# Patient Record
Sex: Female | Born: 1965 | Race: Black or African American | Hispanic: No | Marital: Married | Smoking: Former smoker
Health system: Southern US, Community
[De-identification: ages and names within clinical notes are randomized; demographics above are authoritative.]

## PROBLEM LIST (undated history)

## (undated) DIAGNOSIS — E119 Type 2 diabetes mellitus without complications: Secondary | ICD-10-CM

## (undated) DIAGNOSIS — T7840XA Allergy, unspecified, initial encounter: Secondary | ICD-10-CM

## (undated) DIAGNOSIS — F17200 Nicotine dependence, unspecified, uncomplicated: Secondary | ICD-10-CM

## (undated) DIAGNOSIS — B028 Zoster with other complications: Secondary | ICD-10-CM

## (undated) DIAGNOSIS — E669 Obesity, unspecified: Secondary | ICD-10-CM

## (undated) DIAGNOSIS — F32A Depression, unspecified: Secondary | ICD-10-CM

## (undated) DIAGNOSIS — M1990 Unspecified osteoarthritis, unspecified site: Secondary | ICD-10-CM

## (undated) DIAGNOSIS — F329 Major depressive disorder, single episode, unspecified: Secondary | ICD-10-CM

## (undated) DIAGNOSIS — K219 Gastro-esophageal reflux disease without esophagitis: Secondary | ICD-10-CM

## (undated) DIAGNOSIS — R51 Headache: Secondary | ICD-10-CM

## (undated) DIAGNOSIS — I10 Essential (primary) hypertension: Secondary | ICD-10-CM

## (undated) DIAGNOSIS — G8929 Other chronic pain: Secondary | ICD-10-CM

## (undated) DIAGNOSIS — J329 Chronic sinusitis, unspecified: Secondary | ICD-10-CM

## (undated) DIAGNOSIS — R519 Headache, unspecified: Secondary | ICD-10-CM

## (undated) HISTORY — DX: Major depressive disorder, single episode, unspecified: F32.9

## (undated) HISTORY — DX: Unspecified osteoarthritis, unspecified site: M19.90

## (undated) HISTORY — DX: Depression, unspecified: F32.A

## (undated) HISTORY — DX: Gastro-esophageal reflux disease without esophagitis: K21.9

## (undated) HISTORY — DX: Chronic sinusitis, unspecified: J32.9

## (undated) HISTORY — DX: Type 2 diabetes mellitus without complications: E11.9

## (undated) HISTORY — DX: Zoster with other complications: B02.8

## (undated) HISTORY — PX: TONSILLECTOMY: SUR1361

## (undated) HISTORY — DX: Allergy, unspecified, initial encounter: T78.40XA

## (undated) HISTORY — DX: Headache, unspecified: R51.9

## (undated) HISTORY — DX: Headache: R51

## (undated) HISTORY — DX: Obesity, unspecified: E66.9

## (undated) HISTORY — DX: Nicotine dependence, unspecified, uncomplicated: F17.200

## (undated) HISTORY — DX: Other chronic pain: G89.29

## (undated) HISTORY — DX: Essential (primary) hypertension: I10

---

## 1992-07-25 HISTORY — PX: TUBAL LIGATION: SHX77

## 1993-07-25 HISTORY — PX: CHOLECYSTECTOMY: SHX55

## 2000-09-15 ENCOUNTER — Ambulatory Visit (HOSPITAL_COMMUNITY): Admission: RE | Admit: 2000-09-15 | Discharge: 2000-09-15 | Payer: Self-pay | Admitting: Diagnostic Radiology

## 2000-10-13 ENCOUNTER — Inpatient Hospital Stay (HOSPITAL_COMMUNITY): Admission: EM | Admit: 2000-10-13 | Discharge: 2000-10-14 | Payer: Self-pay | Admitting: Diagnostic Radiology

## 2001-11-12 ENCOUNTER — Encounter: Payer: Self-pay | Admitting: Family Medicine

## 2001-11-12 ENCOUNTER — Ambulatory Visit (HOSPITAL_COMMUNITY): Admission: RE | Admit: 2001-11-12 | Discharge: 2001-11-12 | Payer: Self-pay | Admitting: Family Medicine

## 2001-12-31 ENCOUNTER — Emergency Department (HOSPITAL_COMMUNITY): Admission: EM | Admit: 2001-12-31 | Discharge: 2001-12-31 | Payer: Self-pay | Admitting: Emergency Medicine

## 2002-01-01 ENCOUNTER — Emergency Department (HOSPITAL_COMMUNITY): Admission: EM | Admit: 2002-01-01 | Discharge: 2002-01-01 | Payer: Self-pay | Admitting: Emergency Medicine

## 2002-07-25 HISTORY — PX: OTHER SURGICAL HISTORY: SHX169

## 2002-11-07 ENCOUNTER — Ambulatory Visit (HOSPITAL_BASED_OUTPATIENT_CLINIC_OR_DEPARTMENT_OTHER): Admission: RE | Admit: 2002-11-07 | Discharge: 2002-11-07 | Payer: Self-pay | Admitting: Obstetrics and Gynecology

## 2003-11-26 ENCOUNTER — Emergency Department (HOSPITAL_COMMUNITY): Admission: EM | Admit: 2003-11-26 | Discharge: 2003-11-27 | Payer: Self-pay | Admitting: *Deleted

## 2003-12-01 ENCOUNTER — Ambulatory Visit (HOSPITAL_COMMUNITY): Admission: RE | Admit: 2003-12-01 | Discharge: 2003-12-01 | Payer: Self-pay | Admitting: *Deleted

## 2003-12-22 ENCOUNTER — Ambulatory Visit (HOSPITAL_COMMUNITY): Admission: RE | Admit: 2003-12-22 | Discharge: 2003-12-22 | Payer: Self-pay | Admitting: Orthopedic Surgery

## 2004-05-04 ENCOUNTER — Ambulatory Visit (HOSPITAL_COMMUNITY): Admission: RE | Admit: 2004-05-04 | Discharge: 2004-05-04 | Payer: Self-pay | Admitting: Neurology

## 2004-05-31 ENCOUNTER — Ambulatory Visit: Payer: Self-pay | Admitting: Family Medicine

## 2004-08-10 ENCOUNTER — Ambulatory Visit: Payer: Self-pay | Admitting: Family Medicine

## 2004-10-18 ENCOUNTER — Emergency Department (HOSPITAL_COMMUNITY): Admission: EM | Admit: 2004-10-18 | Discharge: 2004-10-18 | Payer: Self-pay | Admitting: Emergency Medicine

## 2004-10-18 ENCOUNTER — Ambulatory Visit: Payer: Self-pay | Admitting: Psychiatry

## 2004-10-18 ENCOUNTER — Inpatient Hospital Stay (HOSPITAL_COMMUNITY): Admission: RE | Admit: 2004-10-18 | Discharge: 2004-10-22 | Payer: Self-pay | Admitting: Psychiatry

## 2004-10-20 ENCOUNTER — Ambulatory Visit (HOSPITAL_COMMUNITY): Admission: RE | Admit: 2004-10-20 | Discharge: 2004-10-20 | Payer: Self-pay | Admitting: Psychiatry

## 2004-10-26 ENCOUNTER — Ambulatory Visit: Payer: Self-pay | Admitting: Family Medicine

## 2004-11-09 ENCOUNTER — Ambulatory Visit: Payer: Self-pay | Admitting: Family Medicine

## 2004-11-17 ENCOUNTER — Ambulatory Visit: Payer: Self-pay | Admitting: Family Medicine

## 2005-01-10 ENCOUNTER — Ambulatory Visit (HOSPITAL_COMMUNITY): Admission: RE | Admit: 2005-01-10 | Discharge: 2005-01-10 | Payer: Self-pay | Admitting: Family Medicine

## 2005-01-10 ENCOUNTER — Ambulatory Visit: Payer: Self-pay | Admitting: Family Medicine

## 2005-01-12 ENCOUNTER — Ambulatory Visit (HOSPITAL_COMMUNITY): Admission: RE | Admit: 2005-01-12 | Discharge: 2005-01-12 | Payer: Self-pay | Admitting: Family Medicine

## 2005-01-27 ENCOUNTER — Ambulatory Visit (HOSPITAL_COMMUNITY): Admission: RE | Admit: 2005-01-27 | Discharge: 2005-01-27 | Payer: Self-pay | Admitting: Family Medicine

## 2005-05-02 ENCOUNTER — Ambulatory Visit: Payer: Self-pay | Admitting: Family Medicine

## 2005-06-01 ENCOUNTER — Emergency Department (HOSPITAL_COMMUNITY): Admission: EM | Admit: 2005-06-01 | Discharge: 2005-06-01 | Payer: Self-pay | Admitting: Emergency Medicine

## 2005-06-02 ENCOUNTER — Ambulatory Visit: Payer: Self-pay | Admitting: Family Medicine

## 2005-08-24 ENCOUNTER — Ambulatory Visit: Payer: Self-pay | Admitting: Family Medicine

## 2005-10-18 ENCOUNTER — Ambulatory Visit: Payer: Self-pay | Admitting: Family Medicine

## 2005-11-28 ENCOUNTER — Ambulatory Visit (HOSPITAL_COMMUNITY): Admission: RE | Admit: 2005-11-28 | Discharge: 2005-11-28 | Payer: Self-pay | Admitting: Family Medicine

## 2006-01-22 ENCOUNTER — Encounter (INDEPENDENT_AMBULATORY_CARE_PROVIDER_SITE_OTHER): Payer: Self-pay | Admitting: *Deleted

## 2006-01-22 LAB — CONVERTED CEMR LAB: Pap Smear: NORMAL

## 2006-02-14 ENCOUNTER — Encounter: Payer: Self-pay | Admitting: Family Medicine

## 2006-02-14 ENCOUNTER — Ambulatory Visit: Payer: Self-pay | Admitting: Family Medicine

## 2006-02-14 ENCOUNTER — Other Ambulatory Visit: Admission: RE | Admit: 2006-02-14 | Discharge: 2006-02-14 | Payer: Self-pay | Admitting: Family Medicine

## 2006-04-24 ENCOUNTER — Ambulatory Visit: Payer: Self-pay | Admitting: Family Medicine

## 2006-04-25 ENCOUNTER — Ambulatory Visit: Payer: Self-pay | Admitting: Family Medicine

## 2006-05-17 ENCOUNTER — Ambulatory Visit: Payer: Self-pay | Admitting: Orthopedic Surgery

## 2006-07-28 ENCOUNTER — Ambulatory Visit: Payer: Self-pay | Admitting: Family Medicine

## 2006-09-08 ENCOUNTER — Ambulatory Visit: Payer: Self-pay | Admitting: Family Medicine

## 2006-09-21 ENCOUNTER — Encounter: Payer: Self-pay | Admitting: Family Medicine

## 2006-09-21 LAB — CONVERTED CEMR LAB
Basophils Absolute: 0 10*3/uL (ref 0.0–0.1)
CO2: 27 meq/L (ref 19–32)
Cholesterol: 164 mg/dL (ref 0–200)
Eosinophils Absolute: 0.1 10*3/uL (ref 0.0–0.7)
Glucose, Bld: 91 mg/dL (ref 70–99)
HDL: 60 mg/dL (ref >39)
Lymphocytes Relative: 47 % — ABNORMAL HIGH (ref 12–46)
Lymphs Abs: 2.2 10*3/uL (ref 0.7–3.3)
Neutrophils Relative %: 30 % — ABNORMAL LOW (ref 43–77)
Platelets: 249 10*3/uL (ref 150–400)
Potassium: 4.3 meq/L (ref 3.5–5.3)
RDW: 14.7 % — ABNORMAL HIGH (ref 11.5–14.0)
Sodium: 138 meq/L (ref 135–145)
Total CHOL/HDL Ratio: 2.7
VLDL: 16 mg/dL (ref 0–40)
WBC: 4.6 10*3/uL (ref 4.0–10.5)

## 2006-11-30 IMAGING — CT CT PELVIS W/ CM
1 of 3 series · 14 of 32 positions shown, 19 images · IV contrast (omnipaque)
Comparison: None.

CLINICAL DATA: Bilateral lower extremity swelling.  Mid abdominal pain.
 ABDOMEN CT WITH CONTRAST:
TECHNIQUE: Multidetector CT imaging of the abdomen was performed following the standard protocol during bolus administration of intravenous contrast.
 Contrast:  150 cc Omnipaque 300.
TECHNIQUE: Multidetector CT imaging of the pelvis was performed following the standard protocol during bolus administration of intravenous contrast.

[Series 2536: — · axial · 0.88mm/px · z∈[+1420,+1854]mm · 14 of 99 slices shown, 19 images]
[im 6/99  soft-tissue]
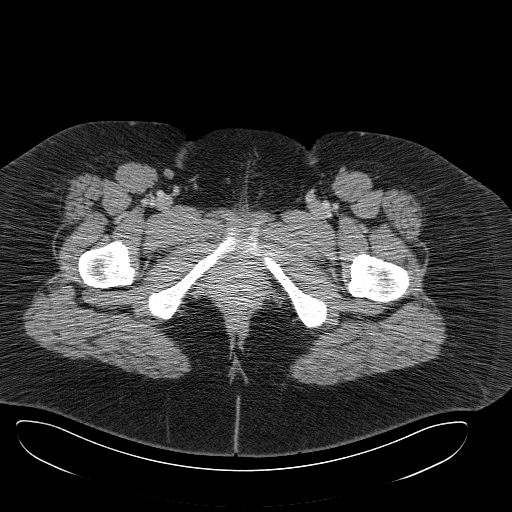
[im 6/99  bone]
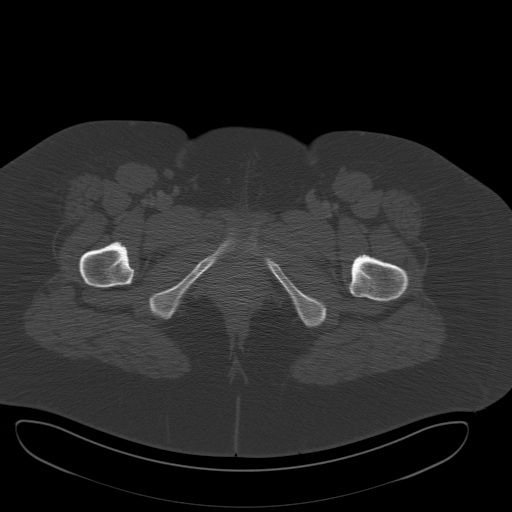
[im 12/99  soft-tissue]
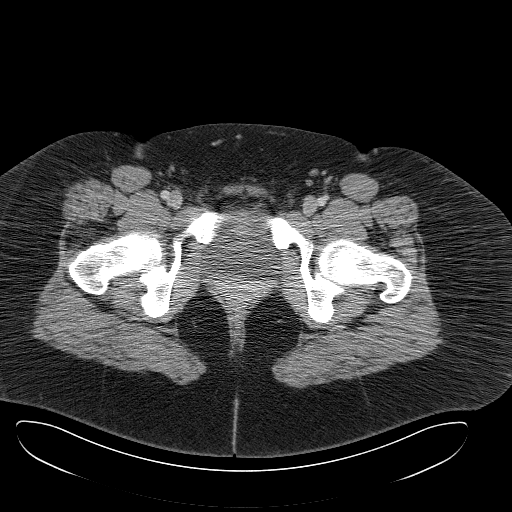
[im 24/99  soft-tissue]
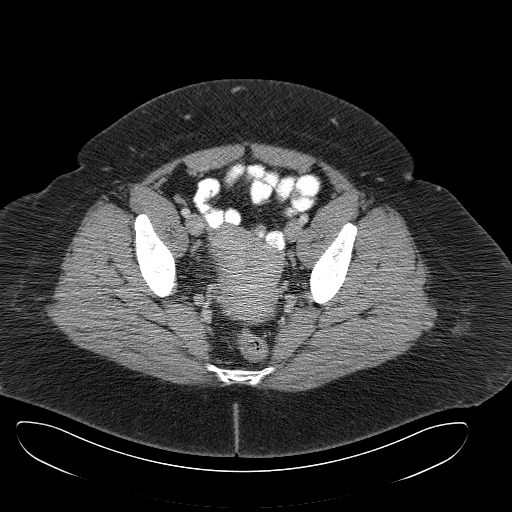
[im 29/99  soft-tissue]
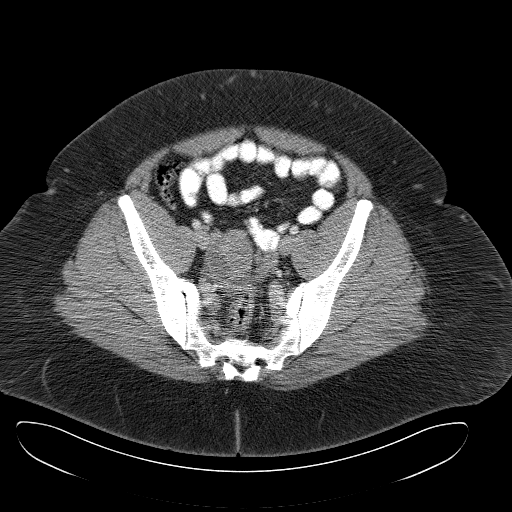
[im 35/99  soft-tissue]
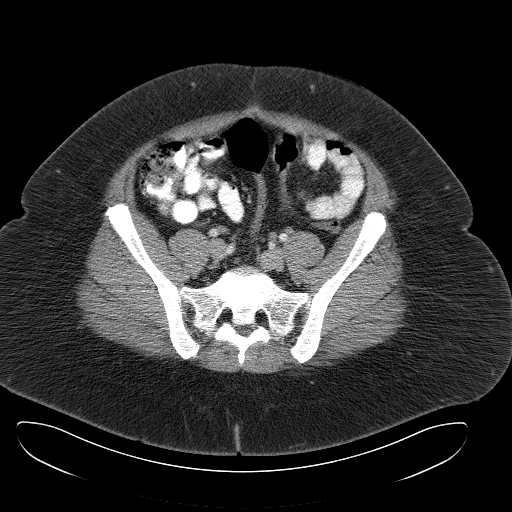
[im 41/99  soft-tissue]
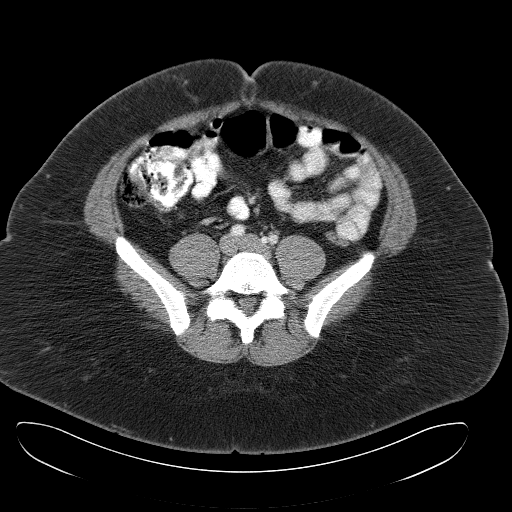
[im 52/99  soft-tissue]
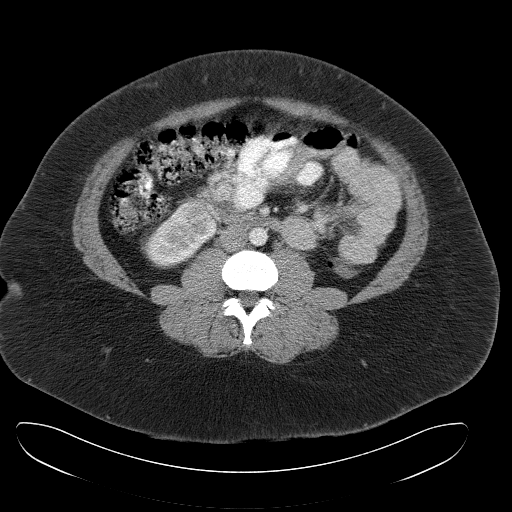
[im 58/99  soft-tissue]
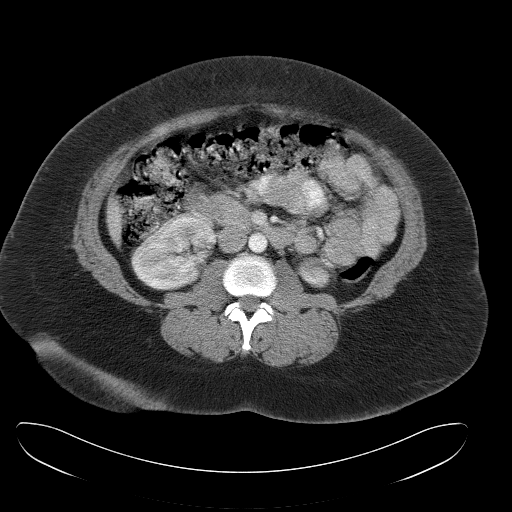
[im 64/99  soft-tissue]
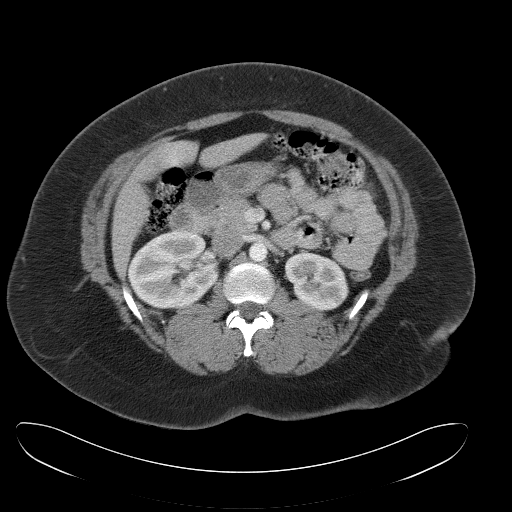
[im 64/99  bone]
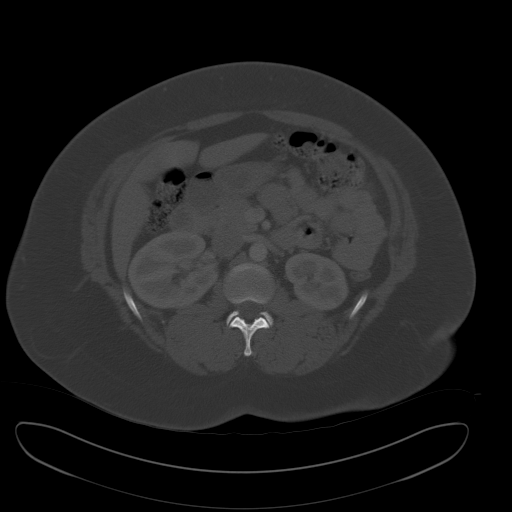
[im 70/99  soft-tissue]
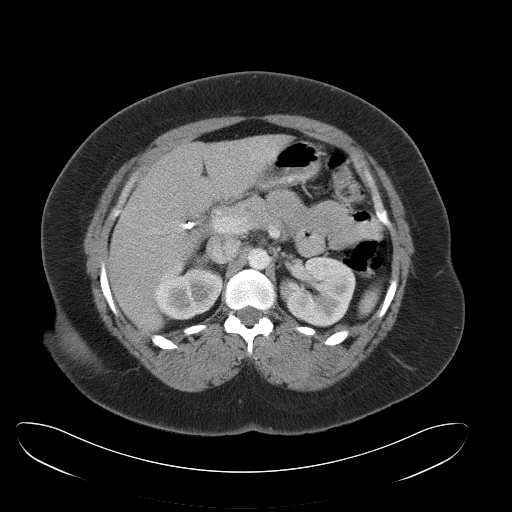
[im 75/99  soft-tissue]
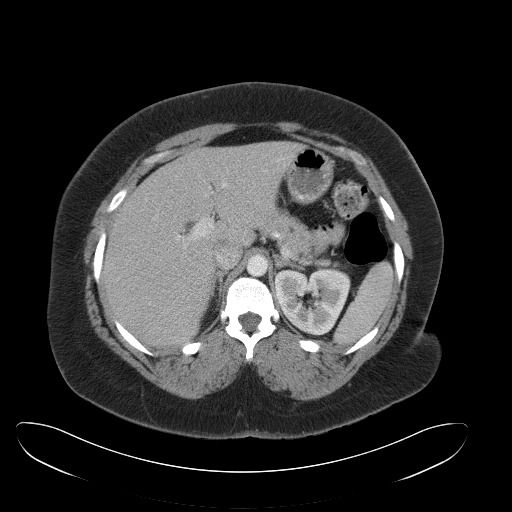
[im 75/99  lung]
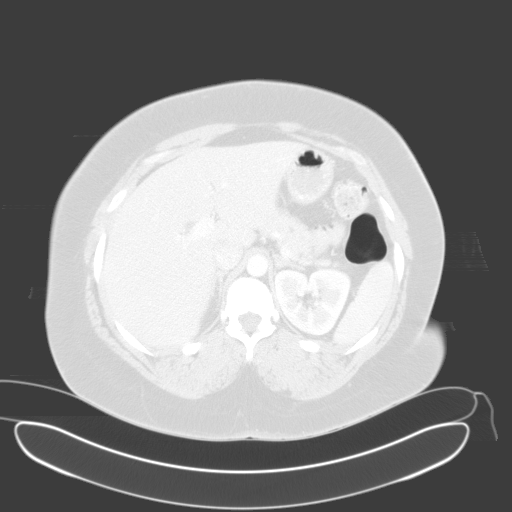
[im 81/99  lung]
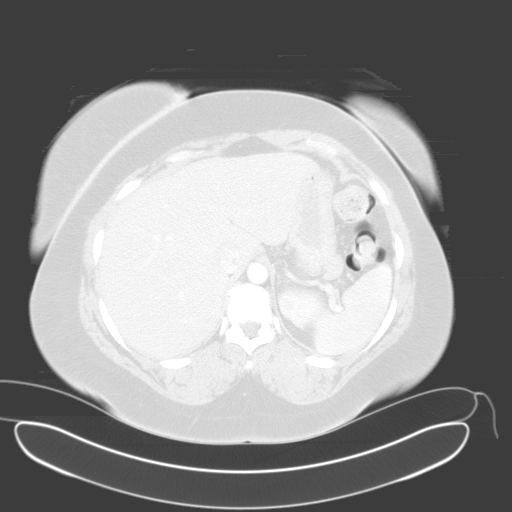
[im 87/99  soft-tissue]
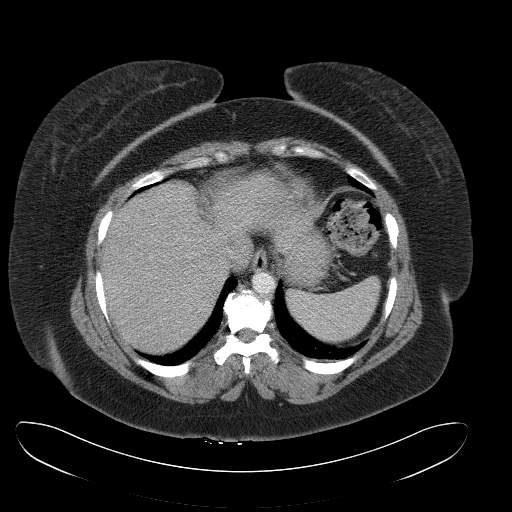
[im 87/99  lung]
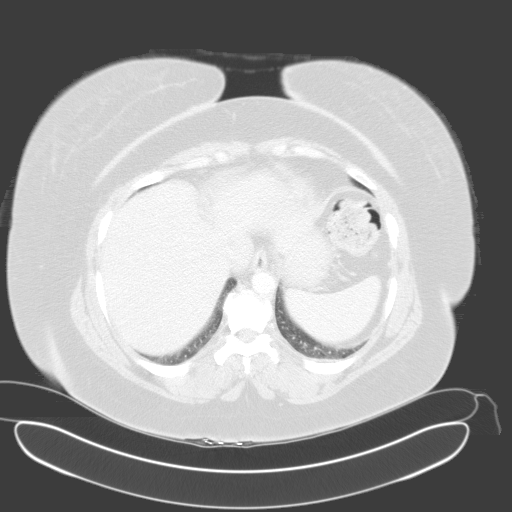
[im 93/99  soft-tissue]
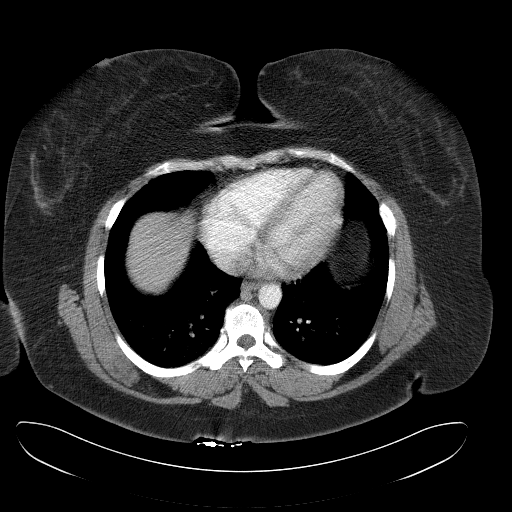
[im 93/99  lung]
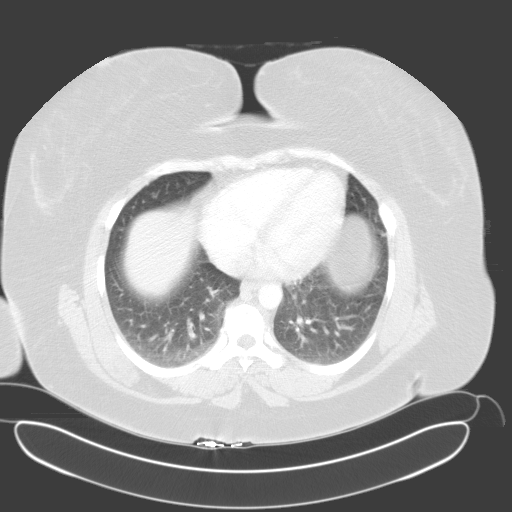

[14 of 32 positions shown; findings below may reference images not displayed]

FINDINGS: Lung bases clear.  Liver, spleen, pancreas, and adrenals normal.  Prior cholecystectomy.  No bile duct obstruction.  Early and delayed images of the kidneys unremarkable.  There is a small simple cyst on the left.
IMPRESSION: No acute or significant findings ? status post cholecystectomy. 
 PELVIS CT WITH CONTRAST:
FINDINGS: No retroperitoneal adenopathy.   No evidence for ileofemoral thrombosis or lower IVC thrombosis.  Pelvic sidewalls and presacral space are normal.  
 The uterus does not appear to be significantly enlarged.  There is a probable fibroid in the right dome of the uterus.  It is possible that this is an adnexal mass.  It measures approximately 3.5 x 4.5 cm.  The patient had a prior pelvic ultrasound in October 2001.  I would recommend a repeat ultrasound to see if this is a uterine fibroid or an adnexal mass.  This would not be a cause for lower extremity swelling.  I would regard it as an incidental finding.
IMPRESSION: Unremarkable except for a mass in the dome of the uterus or right adnexa ? recommend ultrasound for further assessment.

## 2006-12-08 ENCOUNTER — Ambulatory Visit: Payer: Self-pay | Admitting: Family Medicine

## 2007-08-06 ENCOUNTER — Encounter (INDEPENDENT_AMBULATORY_CARE_PROVIDER_SITE_OTHER): Payer: Self-pay | Admitting: *Deleted

## 2007-08-06 ENCOUNTER — Other Ambulatory Visit: Admission: RE | Admit: 2007-08-06 | Discharge: 2007-08-06 | Payer: Self-pay | Admitting: Family Medicine

## 2007-08-06 ENCOUNTER — Ambulatory Visit: Payer: Self-pay | Admitting: Family Medicine

## 2007-08-06 ENCOUNTER — Encounter: Payer: Self-pay | Admitting: Family Medicine

## 2007-08-06 LAB — CONVERTED CEMR LAB: Pap Smear: NORMAL

## 2007-08-07 ENCOUNTER — Encounter: Payer: Self-pay | Admitting: Family Medicine

## 2007-08-07 LAB — CONVERTED CEMR LAB
Bilirubin Urine: NEGATIVE
Candida species: NEGATIVE
Chlamydia, DNA Probe: NEGATIVE
Gardnerella vaginalis: NEGATIVE
Ketones, ur: NEGATIVE mg/dL
Specific Gravity, Urine: 1.018 (ref 1.005–1.03)
Trichomonal Vaginitis: NEGATIVE
Urine Glucose: NEGATIVE mg/dL
Urobilinogen, UA: 0.2 (ref 0.0–1.0)

## 2007-11-20 ENCOUNTER — Ambulatory Visit: Payer: Self-pay | Admitting: Family Medicine

## 2007-11-28 ENCOUNTER — Encounter (INDEPENDENT_AMBULATORY_CARE_PROVIDER_SITE_OTHER): Payer: Self-pay | Admitting: *Deleted

## 2007-11-28 DIAGNOSIS — Z6841 Body Mass Index (BMI) 40.0 and over, adult: Secondary | ICD-10-CM | POA: Insufficient documentation

## 2007-11-28 DIAGNOSIS — I10 Essential (primary) hypertension: Secondary | ICD-10-CM

## 2007-11-28 DIAGNOSIS — Z87891 Personal history of nicotine dependence: Secondary | ICD-10-CM | POA: Insufficient documentation

## 2008-03-10 ENCOUNTER — Ambulatory Visit: Payer: Self-pay | Admitting: Family Medicine

## 2008-03-12 DIAGNOSIS — G4700 Insomnia, unspecified: Secondary | ICD-10-CM

## 2008-06-30 ENCOUNTER — Ambulatory Visit: Payer: Self-pay | Admitting: Family Medicine

## 2008-06-30 LAB — CONVERTED CEMR LAB: Hgb A1c MFr Bld: 6.1 %

## 2008-07-01 DIAGNOSIS — K219 Gastro-esophageal reflux disease without esophagitis: Secondary | ICD-10-CM | POA: Insufficient documentation

## 2008-07-01 DIAGNOSIS — F4323 Adjustment disorder with mixed anxiety and depressed mood: Secondary | ICD-10-CM

## 2008-07-01 DIAGNOSIS — J0100 Acute maxillary sinusitis, unspecified: Secondary | ICD-10-CM | POA: Insufficient documentation

## 2008-07-07 ENCOUNTER — Telehealth: Payer: Self-pay | Admitting: Family Medicine

## 2008-07-28 ENCOUNTER — Encounter: Payer: Self-pay | Admitting: Family Medicine

## 2008-07-28 LAB — CONVERTED CEMR LAB
ALT: 9 units/L (ref 0–35)
AST: 12 units/L (ref 0–37)
Albumin: 4.1 g/dL (ref 3.5–5.2)
Alkaline Phosphatase: 82 units/L (ref 39–117)
Basophils Absolute: 0 10*3/uL (ref 0.0–0.1)
Basophils Relative: 1 % (ref 0–1)
Cholesterol: 167 mg/dL (ref 0–200)
HDL: 56 mg/dL (ref >39)
Hemoglobin: 12.8 g/dL (ref 12.0–15.0)
Lymphocytes Relative: 44 % (ref 12–46)
MCHC: 31.4 g/dL (ref 30.0–36.0)
Monocytes Absolute: 0.5 10*3/uL (ref 0.1–1.0)
Neutro Abs: 2.8 10*3/uL (ref 1.7–7.7)
Neutrophils Relative %: 44 % (ref 43–77)
Platelets: 233 10*3/uL (ref 150–400)
RDW: 15.7 % — ABNORMAL HIGH (ref 11.5–15.5)
TSH: 1.894 microintl units/mL (ref 0.350–4.50)
Total Bilirubin: 0.4 mg/dL (ref 0.3–1.2)
Total CHOL/HDL Ratio: 3

## 2008-08-08 ENCOUNTER — Ambulatory Visit (HOSPITAL_COMMUNITY): Admission: RE | Admit: 2008-08-08 | Discharge: 2008-08-08 | Payer: Self-pay | Admitting: Family Medicine

## 2008-08-08 ENCOUNTER — Ambulatory Visit: Payer: Self-pay | Admitting: Family Medicine

## 2008-08-08 DIAGNOSIS — T7840XA Allergy, unspecified, initial encounter: Secondary | ICD-10-CM | POA: Insufficient documentation

## 2008-09-22 ENCOUNTER — Other Ambulatory Visit: Admission: RE | Admit: 2008-09-22 | Discharge: 2008-09-22 | Payer: Self-pay | Admitting: Family Medicine

## 2008-09-22 ENCOUNTER — Ambulatory Visit: Payer: Self-pay | Admitting: Family Medicine

## 2008-09-22 ENCOUNTER — Encounter: Payer: Self-pay | Admitting: Family Medicine

## 2008-10-13 ENCOUNTER — Telehealth: Payer: Self-pay | Admitting: Family Medicine

## 2008-10-27 ENCOUNTER — Encounter: Payer: Self-pay | Admitting: Family Medicine

## 2008-12-15 ENCOUNTER — Ambulatory Visit: Payer: Self-pay | Admitting: Family Medicine

## 2008-12-15 DIAGNOSIS — N3000 Acute cystitis without hematuria: Secondary | ICD-10-CM

## 2008-12-15 DIAGNOSIS — IMO0002 Reserved for concepts with insufficient information to code with codable children: Secondary | ICD-10-CM

## 2008-12-15 LAB — CONVERTED CEMR LAB
Bilirubin Urine: NEGATIVE
Ketones, urine, test strip: NEGATIVE
Nitrite: NEGATIVE
Protein, U semiquant: NEGATIVE
Urobilinogen, UA: NEGATIVE

## 2008-12-16 ENCOUNTER — Encounter: Payer: Self-pay | Admitting: Family Medicine

## 2009-06-16 ENCOUNTER — Ambulatory Visit: Payer: Self-pay | Admitting: Family Medicine

## 2009-08-10 ENCOUNTER — Ambulatory Visit: Payer: Self-pay | Admitting: Family Medicine

## 2009-08-10 DIAGNOSIS — R7301 Impaired fasting glucose: Secondary | ICD-10-CM

## 2009-08-10 DIAGNOSIS — M79609 Pain in unspecified limb: Secondary | ICD-10-CM

## 2009-08-10 DIAGNOSIS — R5382 Chronic fatigue, unspecified: Secondary | ICD-10-CM

## 2009-08-10 LAB — CONVERTED CEMR LAB: Hgb A1c MFr Bld: 6.5 %

## 2009-08-11 ENCOUNTER — Encounter: Payer: Self-pay | Admitting: Family Medicine

## 2009-08-13 ENCOUNTER — Encounter: Payer: Self-pay | Admitting: Family Medicine

## 2009-08-16 DIAGNOSIS — M62838 Other muscle spasm: Secondary | ICD-10-CM | POA: Insufficient documentation

## 2009-08-20 ENCOUNTER — Telehealth: Payer: Self-pay | Admitting: Family Medicine

## 2009-08-21 LAB — CONVERTED CEMR LAB
AST: 11 units/L (ref 0–37)
Alkaline Phosphatase: 94 units/L (ref 39–117)
BUN: 12 mg/dL (ref 6–23)
Bilirubin, Direct: 0.1 mg/dL (ref 0.0–0.3)
CO2: 27 meq/L (ref 19–32)
Calcium: 9.4 mg/dL (ref 8.4–10.5)
Chloride: 102 meq/L (ref 96–112)
Creatinine, Ser: 0.82 mg/dL (ref 0.40–1.20)
HCT: 42.5 % (ref 36.0–46.0)
Hemoglobin: 13.4 g/dL (ref 12.0–15.0)
Indirect Bilirubin: 0.3 mg/dL (ref 0.0–0.9)
LDL Cholesterol: 115 mg/dL — ABNORMAL HIGH (ref 0–99)
MCV: 71.2 fL — ABNORMAL LOW (ref 78.0–100.0)
Total Bilirubin: 0.4 mg/dL (ref 0.3–1.2)
WBC: 7.8 10*3/uL (ref 4.0–10.5)

## 2009-08-25 ENCOUNTER — Telehealth: Payer: Self-pay | Admitting: Family Medicine

## 2009-11-24 ENCOUNTER — Ambulatory Visit: Payer: Self-pay | Admitting: Family Medicine

## 2009-11-24 ENCOUNTER — Other Ambulatory Visit: Admission: RE | Admit: 2009-11-24 | Discharge: 2009-11-24 | Payer: Self-pay | Admitting: Family Medicine

## 2009-11-24 DIAGNOSIS — J309 Allergic rhinitis, unspecified: Secondary | ICD-10-CM | POA: Insufficient documentation

## 2009-11-24 DIAGNOSIS — E1140 Type 2 diabetes mellitus with diabetic neuropathy, unspecified: Secondary | ICD-10-CM | POA: Insufficient documentation

## 2009-11-24 LAB — CONVERTED CEMR LAB: OCCULT 1: NEGATIVE

## 2009-11-25 ENCOUNTER — Telehealth: Payer: Self-pay | Admitting: Family Medicine

## 2009-11-25 LAB — CONVERTED CEMR LAB
CO2: 29 meq/L (ref 19–32)
Calcium: 9.6 mg/dL (ref 8.4–10.5)
Chloride: 103 meq/L (ref 96–112)
Potassium: 4.5 meq/L (ref 3.5–5.3)
Sodium: 141 meq/L (ref 135–145)

## 2009-11-27 LAB — CONVERTED CEMR LAB: Pap Smear: NEGATIVE

## 2009-12-15 ENCOUNTER — Ambulatory Visit (HOSPITAL_COMMUNITY): Admission: RE | Admit: 2009-12-15 | Discharge: 2009-12-15 | Payer: Self-pay | Admitting: Family Medicine

## 2010-02-03 ENCOUNTER — Telehealth: Payer: Self-pay | Admitting: Family Medicine

## 2010-02-05 ENCOUNTER — Encounter: Payer: Self-pay | Admitting: Family Medicine

## 2010-02-08 ENCOUNTER — Telehealth: Payer: Self-pay | Admitting: Family Medicine

## 2010-02-08 LAB — CONVERTED CEMR LAB
CO2: 25 meq/L (ref 19–32)
Calcium: 9.3 mg/dL (ref 8.4–10.5)
Chloride: 102 meq/L (ref 96–112)
Potassium: 4 meq/L (ref 3.5–5.3)
Sodium: 139 meq/L (ref 135–145)

## 2010-02-16 ENCOUNTER — Telehealth: Payer: Self-pay | Admitting: Family Medicine

## 2010-03-08 ENCOUNTER — Encounter: Payer: Self-pay | Admitting: Family Medicine

## 2010-03-22 ENCOUNTER — Ambulatory Visit: Payer: Self-pay | Admitting: Family Medicine

## 2010-03-23 ENCOUNTER — Encounter: Payer: Self-pay | Admitting: Family Medicine

## 2010-03-23 LAB — CONVERTED CEMR LAB: Microalb Creat Ratio: 4.6 mg/g (ref 0.0–30.0)

## 2010-04-06 ENCOUNTER — Telehealth (INDEPENDENT_AMBULATORY_CARE_PROVIDER_SITE_OTHER): Payer: Self-pay | Admitting: *Deleted

## 2010-04-06 ENCOUNTER — Ambulatory Visit: Payer: Self-pay | Admitting: Family Medicine

## 2010-04-06 ENCOUNTER — Ambulatory Visit (HOSPITAL_COMMUNITY)
Admission: RE | Admit: 2010-04-06 | Discharge: 2010-04-06 | Payer: Self-pay | Source: Home / Self Care | Admitting: Family Medicine

## 2010-04-06 ENCOUNTER — Telehealth: Payer: Self-pay | Admitting: Family Medicine

## 2010-04-07 ENCOUNTER — Telehealth: Payer: Self-pay | Admitting: Family Medicine

## 2010-04-07 LAB — CONVERTED CEMR LAB
CO2: 26 meq/L (ref 19–32)
Calcium: 9 mg/dL (ref 8.4–10.5)
Potassium: 4.3 meq/L (ref 3.5–5.3)
Sodium: 143 meq/L (ref 135–145)
Total CK: 172 units/L (ref 7–177)

## 2010-04-26 ENCOUNTER — Encounter: Payer: Self-pay | Admitting: Family Medicine

## 2010-04-26 LAB — CONVERTED CEMR LAB
ALT: 9 units/L (ref 0–35)
Cholesterol: 172 mg/dL (ref 0–200)
Glucose, Bld: 99 mg/dL (ref 70–99)
Hgb A1c MFr Bld: 6.1 % — ABNORMAL HIGH (ref <5.7)
Potassium: 4.5 meq/L (ref 3.5–5.3)
Sodium: 138 meq/L (ref 135–145)
Total CHOL/HDL Ratio: 3
Total Protein: 6.4 g/dL (ref 6.0–8.3)
Triglycerides: 118 mg/dL (ref <150)
VLDL: 24 mg/dL (ref 0–40)
Vit D, 25-Hydroxy: 35 ng/mL (ref 30–89)

## 2010-05-06 ENCOUNTER — Telehealth: Payer: Self-pay | Admitting: Family Medicine

## 2010-05-26 ENCOUNTER — Telehealth: Payer: Self-pay | Admitting: Family Medicine

## 2010-07-12 ENCOUNTER — Ambulatory Visit: Payer: Self-pay | Admitting: Family Medicine

## 2010-07-12 DIAGNOSIS — K029 Dental caries, unspecified: Secondary | ICD-10-CM

## 2010-08-14 ENCOUNTER — Encounter: Payer: Self-pay | Admitting: Family Medicine

## 2010-08-15 ENCOUNTER — Encounter: Payer: Self-pay | Admitting: Family Medicine

## 2010-08-24 NOTE — Progress Notes (Signed)
Summary: please advise.  Phone Note Call from Patient   Caller: Patient Summary of Call: patient states recently changes bp med since her neck has been bothering her, and calfs sore not sleeping, bp was 140/99 this morning states it has gone down to 118/82 what should she do? the pain in her neck and calf is bothering her, could this be from med?  Initial call taken by: Adella Hare LPN,  April 06, 2010 8:11 AM  Follow-up for Phone Call        adviase not likely the cause, needs ov if persits, bring all meds Follow-up by: Syliva Overman MD,  April 06, 2010 12:23 PM  Additional Follow-up for Phone Call Additional follow up Details #1::        I called patient and she wants to come in today so I gave her to Luann to see about putting her on the schedule.  She is coming in today to see Dawn. Additional Follow-up by: Curtis Sites,  April 06, 2010 2:18 PM

## 2010-08-24 NOTE — Progress Notes (Signed)
  Phone Note Call from Patient   Caller: Patient Complaint: Breathing Problems Summary of Call: patient states she stopped her lovastatin because of leg cramps, bloodwork was normal and was to change to another cholesterol med. previous message states to change to lovastatin, but this is what she had been on Initial call taken by: Adella Hare LPN,  February 08, 2010 8:46 AM  Follow-up for Phone Call        changed to pravastatin per dr Lodema Hong and patient aware Follow-up by: Adella Hare LPN,  February 08, 2010 12:16 PM    New/Updated Medications: PRAVASTATIN SODIUM 40 MG TABS (PRAVASTATIN SODIUM) one tab by mouth at bedtime  discontinue lovastatin Prescriptions: PRAVASTATIN SODIUM 40 MG TABS (PRAVASTATIN SODIUM) one tab by mouth at bedtime  discontinue lovastatin  #30 x 3   Entered by:   Adella Hare LPN   Authorized by:   Syliva Overman MD   Signed by:   Adella Hare LPN on 30/86/5784   Method used:   Electronically to        [facility] (retail)       [address]       [place], [place]  [address]       Ph: [phone]       Fax: [phone]   RxID:   [id number]

## 2010-08-24 NOTE — Progress Notes (Signed)
Summary: CALL BACK  Phone Note Call from Patient   Summary of Call: NEEDS FOR YOU TO CALL HER AT [phone] Initial call taken by: Lind Guest,  August 25, 2009 8:59 AM  Follow-up for Phone Call        wants to know if she should have diarrhea. advised patient this is a possible side effect of her metformin and should subside with time. Follow-up by: Worthy Keeler LPN,  August 25, 2009 9:20 AM

## 2010-08-24 NOTE — Progress Notes (Signed)
Summary: refill  Phone Note Call from Patient   Summary of Call: needs to get a refill on blood pressure pills. [phone] Initial call taken by: Rudene Anda,  May 06, 2010 2:14 PM    Prescriptions: LOSARTAN POTASSIUM 25 MG TABS (LOSARTAN POTASSIUM) Take 1 tablet by mouth once a day  #30 x 3   Entered by:   Everitt Amber LPN   Authorized by:   Syliva Overman MD   Signed by:   Everitt Amber LPN on 95/62/1308   Method used:   Electronically to        [facility] (retail)       [address]       [place], [place]  [address]       Ph: [phone]       Fax: [phone]   RxID:   [id number]

## 2010-08-24 NOTE — Assessment & Plan Note (Signed)
Summary: ov   Vital Signs:  Patient profile:   45 year old female Menstrual status:  postmenopausal Height:      66.5 inches Weight:      326.50 pounds BMI:     52.10 O2 Sat:      96 % Pulse rate:   90 / minute Pulse rhythm:   regular Resp:     16 per minute BP sitting:   108 / 72  Vitals Entered By: Everitt Amber (August 10, 2009 2:47 PM)  Nutrition Counseling: Patient's BMI is greater than 25 and therefore counseled on weight management options. CC: left leg has been throbbing and cramping really bad for 3 or 4 days now. She is concerned and she also hasn't been feeling like herself at all   Primary Care Provider:  Syliva Overman MD  CC:  left leg has been throbbing and cramping really bad for 3 or 4 days now. She is concerned and she also hasn't been feeling like herself at all.  History of Present Illness: left calf pain worse art night x 5 days.She denioes hemoptysis or increased dyspnea.She has had no recent trauma to the area. She is still smoking and hopes to cut back on this. She is concerned about her weight and really plans to lose weight ovewr time this year as it is becoming an increased health problem. She is chronically fatigued.  Preventive Screening-Counseling & Management  Alcohol-Tobacco     Smoking Cessation Counseling: yes  Allergies: 1)  ! Advil 2)  ! Ibuprofen  Review of Systems      See HPI General:  Complains of fatigue and weakness; denies chills and fever. Eyes:  Denies blurring and discharge. ENT:  Denies hoarseness, nasal congestion, sinus pressure, and sore throat. CV:  Denies chest pain or discomfort, palpitations, shortness of breath with exertion, and swelling of feet. GI:  Denies abdominal pain, constipation, diarrhea, indigestion, nausea, and vomiting. GU:  Denies dysuria and urinary frequency. MS:  See HPI. Derm:  Denies itching and rash. Neuro:  Denies headaches and seizures. Psych:  Denies anxiety and depression. Endo:  Denies  cold intolerance, excessive hunger, excessive thirst, excessive urination, heat intolerance, polyuria, and weight change. Heme:  Denies abnormal bruising and bleeding. Allergy:  Complains of seasonal allergies.  Physical Exam  General:  Well-developed,morbidly obese,in no acute distress; alert,appropriate and cooperative throughout examination HEENT: No facial asymmetry,  EOMI, No sinus tenderness, TM's Clear, oropharynx  pink and moist.   Chest: Clear to auscultation bilaterally.  CVS: S1, S2, No murmurs, No S3.   Abd: Soft, Nontender.  MS: decreased  ROM spine, adequate in hips, shoulders and knees.  Ext: No edema.  Homann's negative CNS: CN 2-12 intact, power tone and sensation normal throughout.   Skin: Intact, no visible lesions or rashes.  Psych: Good eye contact, normal affect.  Memory intact, not anxious or depressed appearing.    Impression & Recommendations:  Problem # 1:  IMPAIRED FASTING GLUCOSE (ICD-790.21) Assessment Comment Only  Orders: T- Hemoglobin A1C ([id number])  6.5 in office will rept in ;lab to confirm a dx of DM  Problem # 2:  FATIGUE (ICD-780.79) Assessment: Deteriorated  Orders: T-CBC No Diff ([id number]) T-TSH ([id number])  Problem # 3:  CALF PAIN, LEFT (ICD-729.5) Assessment: Comment Only prescrip[tion for muscle relaxanrt sent in   Problem # 4:  NICOTINE ADDICTION (ICD-305.1) Assessment: Unchanged  Encouraged smoking cessation and discussed different methods for smoking cessation.   Problem # 5:  HYPERTENSION (ICD-401.9) Assessment:  Unchanged  Her updated medication list for this problem includes:    Hydrochlorothiazide 12.5 Mg Caps (Hydrochlorothiazide) .Marland Kitchen... Take 1 capsule by mouth once a day  Orders: T-Basic Metabolic Panel ([id number])  BP today: 108/72 Prior BP: 110/62 (06/16/2009)  Labs Reviewed: K+: 4.3 (09/21/2006) Creat: : 0.76 (09/21/2006)   Chol: 167 (07/28/2008)   HDL: 56 (07/28/2008)   LDL: 86 (07/28/2008)   TG:  127 (07/28/2008)  Problem # 6:  MUSCLE SPASM (ICD-728.85) Assessment: Deteriorated start flexeril  Complete Medication List: 1)  Hydrochlorothiazide 12.5 Mg Caps (Hydrochlorothiazide) .... Take 1 capsule by mouth once a day 2)  Omeprazole 40 Mg Cpdr (Omeprazole) .... Take 1 capsule by mouth once a day 3)  Klor-con M20 20 Meq Cr-tabs (Potassium chloride crys cr) .... Take 1 tablet by mouth once a day 4)  Alprazolam 1 Mg Tabs (Alprazolam) .... Take 1 tab by mouth at bedtime 5)  Flonase 50 Mcg/act Susp (Fluticasone propionate) .... One to two puffs per nostril daily 6)  Septra Ds 800-160 Mg Tabs (Sulfamethoxazole-trimethoprim) .... Take 1 tablet by mouth two times a day 7)  Fluconazole 150 Mg Tabs (Fluconazole) .... Take 1 tablet by mouth once a day as needed 8)  Phentermine Hcl 37.5 Mg Tabs (Phentermine hcl) .... Take 1 tablet by mouth once a day 9)  Cyclobenzaprine Hcl 10 Mg Tabs (Cyclobenzaprine hcl) .... Take 1 tablet by mouth three times a day as needed  Other Orders: T-Hepatic Function [id number]) T-Lipid Profile [id number]) Radiology Referral (Radiology)  Patient Instructions: 1)  CPE in September 22, 2009 or after. 2)  It is important that you exercise regularly at least 20 minutes 5 times a week. If you develop chest pain, have severe difficulty breathing, or feel very tired , stop exercising immediately and seek medical attention. 3)  You need to lose weight. Consider a lower calorie diet and regular exercise.  4)  Tobacco is very bad for your health and your loved ones! You Should stop smoking!. 5)  Stop Smoking Tips: Choose a Quit date. Cut down before the Quit date. decide what you will do as a substitute when you feel the urge to smoke(gum,toothpick,exercise). 6)  You are getting meds sent in for muscle spasm, and appts made for mamo is past due  7)  Call if leg pain persits or worsens   Prescriptions: CYCLOBENZAPRINE HCL 10 MG TABS (CYCLOBENZAPRINE HCL) Take 1 tablet by  mouth three times a day as needed  #40 x 0   Entered and Authorized by:   Syliva Overman MD   Signed by:   Syliva Overman MD on 08/10/2009   Method used:   Electronically to        [facility] (retail)       [address]       [place], [place]  [address]       Ph: [phone]       Fax: [phone]   RxID:   [id number]   Laboratory Results   Blood Tests   Date/Time Received: August 10, 2009  Date/Time Reported: August 10, 2009   HGBA1C: 6.5%   (Normal Range: Non-Diabetic - 3-6%   Control Diabetic - 6-8%)

## 2010-08-24 NOTE — Progress Notes (Signed)
Summary: change med  Phone Note Call from Patient   Summary of Call: having cramps in legs and she stop taking lovastin 40mg . can she get something diff.   [phone] Initial call taken by: Rudene Anda,  February 03, 2010 3:40 PM  Follow-up for Phone Call        advise her to get chem 7 andorder to ensure her potassium is okpls, and  let her know I will change to lovastatin 40mg  Take 1 tab by mouth at bedtime #30 refill x 4 if the potassium and labwork is normal , I would not change before this is checked.so do not send in script yet let us wait on that  Follow-up by: Syliva Overman MD,  February 03, 2010 4:55 PM  Additional Follow-up for Phone Call Additional follow up Details #1::        lab ordered, patient aware Additional Follow-up by: Adella Hare LPN,  February 03, 2010 5:00 PM

## 2010-08-24 NOTE — Progress Notes (Signed)
  Phone Note Call from Patient   Caller: Patient Summary of Call: patient states pravastatin is making her nauseous Initial call taken by: Adella Hare LPN,  February 16, 2010 2:32 PM  Follow-up for Phone Call        since this is the second med that has bothered her , she is to be encouraged to reduce taking it to 4 times per week, maybe witth a small amt of food may help, at her next visit I will discuss otheroptions. She definitewly needs to follow a low fat diet Follow-up by: Syliva Overman MD,  February 16, 2010 2:58 PM  Additional Follow-up for Phone Call Additional follow up Details #1::        patient aware Additional Follow-up by: Adella Hare LPN,  February 16, 2010 4:36 PM

## 2010-08-24 NOTE — Assessment & Plan Note (Signed)
Summary: ov   Vital Signs:  Patient profile:   45 year old female Menstrual status:  postmenopausal Height:      66.5 inches Weight:      327.75 pounds BMI:     52.30 O2 Sat:      99 % on Room air Pulse rate:   82 / minute Pulse rhythm:   regular Resp:     16 per minute BP sitting:   106 / 62  (left arm)  Vitals Entered By: Adella Hare LPN (April 06, 2010 3:50 PM)  Nutrition Counseling: Patient's BMI is greater than 25 and therefore counseled on weight management options.  O2 Flow:  Room air CC: cramps in legs and neck Is Patient Diabetic? Yes Did you bring your meter with you today? No   Primary Care [facility] Begley:  Syliva Overman MD  CC:  cramps in legs and neck.  History of Present Illness: Left chest pressure radiating to left upper ext last night couldn't sleep, felt cold inside, nauseatesd, no light headedness, couldn't sleep, took reflux med. Has been havin neck pain on left since past 5 days, disturbing her sleep, max a 6  Sore post right calf since last night, skin crawling , soreness in both thighs and cramps and pain in both calves. She thinks her meds are a big part of her problem and wants to stoip the cholesterol med in particular.sh denies depression, has inc anxiety, and is trying to stop smoking  Preventive Screening-Counseling & Management  Alcohol-Tobacco     Smoking Cessation Counseling: yes  Current Medications (verified): 1)  Omeprazole 40 Mg Cpdr (Omeprazole) .... Take 1 Capsule By Mouth Once A Day 2)  Alprazolam 1 Mg Tabs (Alprazolam) .... Take 1 Tab By Mouth At Bedtime 3)  Phentermine Hcl 37.5 Mg Tabs (Phentermine Hcl) .... Take 1 Tablet By Mouth Once A Day 4)  Metformin Hcl 500 Mg Tabs (Metformin Hcl) .... One Tab By Mouth Bid 5)  Onetouch Test  Strp (Glucose Blood) .... Once Daily Testing 6)  Onetouch Lancets  Misc (Lancets) .... Once Daily Testing 7)  Vitamin D (Ergocalciferol) 50000 Unit Caps (Ergocalciferol) .... One Cap By Mouth Every  Week 8)  Pravastatin Sodium 40 Mg Tabs (Pravastatin Sodium) .... One Tab By Mouth At Bedtime  Discontinue Lovastatin 9)  Benazepril Hcl 10 Mg Tabs (Benazepril Hcl) .... Take 1 Tablet By Mouth Once A Day 10)  Penicillin V Potassium 500 Mg Tabs (Penicillin V Potassium) .... Take 1 Tablet By Mouth Three Times A Day 11)  Fluconazole 150 Mg Tabs (Fluconazole) .... Take 1 Tablet By Mouth Once A Day As Needed  Allergies (verified): 1)  ! Advil 2)  ! Ibuprofen  Review of Systems      See HPI General:  Complains of fatigue and sleep disorder. Eyes:  Denies blurring and discharge. ENT:  Denies hoarseness and nasal congestion. CV:  Denies chest pain or discomfort, palpitations, and swelling of feet. Resp:  Denies cough and sputum productive. GI:  Denies abdominal pain, constipation, diarrhea, nausea, and vomiting. GU:  Denies dysuria and urinary frequency. MS:  Complains of joint pain, muscle aches, and cramps. Derm:  Denies itching and rash. Neuro:  Complains of headaches; denies seizures and sensation of room spinning. Psych:  Complains of anxiety; denies depression. Endo:  Denies excessive thirst and excessive urination. Heme:  Denies abnormal bruising and bleeding. Allergy:  Complains of seasonal allergies.  Physical Exam  General:  Well-developedobese,in no acute distress; alert,appropriate and cooperative throughout  examination. pt very anxious HEENT: No facial asymmetry,  EOMI, No sinus tenderness, TM's Clear, oropharynx  pink and moist.   Chest: Clear to auscultation bilaterally. decreased air entry, reproducible tenderness on palpation of 3r CVS: S1, S2, No murmurs, No S3.   Abd: Soft, Nontender.  MS: Adequate ROM spine, hips, shoulders and knees. decreased in neck Ext: No edema.   CNS: CN 2-12 intact, power tone and sensation normal throughout.   Skin: Intact, no visible lesions or rashes.  Psych: Good eye contact, normal affect.  Memory intact,    Impression &  Recommendations:  Problem # 1:  NECK PAIN (ICD-723.1) Assessment Comment Only  Orders: Ketorolac-Toradol 15mg  (B2841) Admin of Therapeutic Inj  intramuscular or subcutaneous (32440)  Problem # 2:  CALF PAIN, BILATERAL (ICD-729.5) Assessment: Comment Only  Orders: Radiology Referral (Radiology)  Problem # 3:  DIABETES MELLITUS, TYPE II (ICD-250.00) Assessment: Unchanged  The following medications were removed from the medication list:    Benazepril Hcl 10 Mg Tabs (Benazepril hcl) .Marland Kitchen... Take 1 tablet by mouth once a day Her updated medication list for this problem includes:    Metformin Hcl 500 Mg Tabs (Metformin hcl) ..... One tab by mouth bid    Losartan Potassium 25 Mg Tabs (Losartan potassium) .Marland Kitchen... Take 1 tablet by mouth once a day  Labs Reviewed: Creat: 0.72 (02/05/2010)    Reviewed HgBA1c results: 6.5 (11/24/2009)  7.0 (08/11/2009)  Problem # 4:  ADJ DISORDER WITH MIXED ANXIETY & DEPRESSED MOOD (ICD-309.28) Assessment: Deteriorated  Problem # 5:  CHEST PAIN UNSPECIFIED (ICD-786.50) Assessment: Comment Only seems to be radiating from the neck The pt does have multiple cardiac risk factors, nicotine cessation counselling done, will check troponin1  Complete Medication List: 1)  Omeprazole 40 Mg Cpdr (Omeprazole) .... Take 1 capsule by mouth once a day 2)  Alprazolam 1 Mg Tabs (Alprazolam) .... Take 1 tab by mouth at bedtime 3)  Phentermine Hcl 37.5 Mg Tabs (Phentermine hcl) .... Take 1 tablet by mouth once a day 4)  Metformin Hcl 500 Mg Tabs (Metformin hcl) .... One tab by mouth bid 5)  Onetouch Test Strp (Glucose blood) .... Once daily testing 6)  Onetouch Lancets Misc (Lancets) .... Once daily testing 7)  Vitamin D (ergocalciferol) 50000 Unit Caps (Ergocalciferol) .... One cap by mouth every week 8)  Penicillin V Potassium 500 Mg Tabs (Penicillin v potassium) .... Take 1 tablet by mouth three times a day 9)  Fluconazole 150 Mg Tabs (Fluconazole) .... Take 1 tablet by  mouth once a day as needed 10)  Losartan Potassium 25 Mg Tabs (Losartan potassium) .... Take 1 tablet by mouth once a day  Other Orders: T-Basic Metabolic Panel [id number]) T-Troponin I ([id number]) TLB-CK Total Only(Creatine Kinase/CPK) (82550-CK)  Patient Instructions: 1)  Please schedule a follow-up appointment in 1 month. 2)  Tobacco is very bad for your health and your loved ones! You Should stop smoking!. 3)  Stop Smoking Tips: Choose a Quit date. Cut down before the Quit date. decide what you will do as a substitute when you feel the urge to smoke(gum,toothpick,exercise). 4)  It is important that you exercise regularly at least 20 minutes 5 times a week. If you develop chest pain, have severe difficulty breathing, or feel very tired , stop exercising immediately and seek medical attention. 5)  You need to lose weight. Consider a lower calorie diet and regular exercise.  6)  You will bne referred for a study to make sure you have  no blood clots in your legs. 7)  PLs stop pravachol and benazepriil due to possible side effects. 8)  New meds are  sent in. 9)  You will get toradol in the office for the neck pain Prescriptions: LOSARTAN POTASSIUM 25 MG TABS (LOSARTAN POTASSIUM) Take 1 tablet by mouth once a day  #30 x 3   Entered and Authorized by:   Syliva Overman MD   Signed by:   Syliva Overman MD on 04/06/2010   Method used:   Printed then faxed to ...       [facility]       [address]       [place], [place]  [address]       Ph: [phone]       Fax: [phone]   RxID:   [id number]    Medication Administration  Injection # 1:    Medication: Ketorolac-Toradol 15mg     Diagnosis: NECK PAIN (ICD-723.1)    Route: IM    Site: LUOQ gluteus    Exp Date: 09/23/2011    Lot #: [id number]    Mfr: [email or web address]    Comments: toradol 60mg  given    Patient tolerated injection without complications    Given by: Adella Hare LPN  (April 06, 2010 4:23 PM)  Orders Added: 1)  T-Basic Metabolic Panel [id number] 2)  T-Troponin I [[id number]] 3)  TLB-CK Total Only(Creatine Kinase/CPK) [82550-CK] 4)  Est. Patient Level IV [22025] 5)  Ketorolac-Toradol 15mg  [J1885] 6)  Admin of Therapeutic Inj  intramuscular or subcutaneous [96372] 7)  Radiology Referral [Radiology]

## 2010-08-24 NOTE — Assessment & Plan Note (Signed)
Summary: phy   Vital Signs:  Patient profile:   45 year old female Menstrual status:  postmenopausal Height:      66.5 inches Weight:      323 pounds BMI:     51.54 O2 Sat:      96 % Pulse rate:   103 / minute Pulse rhythm:   regular Resp:     16 per minute BP sitting:   104 / 74  (left arm) Cuff size:   xl  Vitals Entered By: Everitt Amber LPN (Nov 25, 3555 1:01 PM)  Nutrition Counseling: Patient's BMI is greater than 25 and therefore counseled on weight management options. CC: needs something for her allergies, has itchy watery eyes, scratchy thorat and ears and puffy eyes  Vision Screening:Left eye w/o correction: 20 / 25 Right Eye w/o correction: 20 / 25 Both eyes w/o correction:  20/ 25        Vision Entered By: Everitt Amber LPN (Nov 25, 3218 2:29 PM)   Primary Care Provider:  Syliva Overman MD  CC:  needs something for her allergies, has itchy watery eyes, and scratchy thorat and ears and puffy eyes.  History of Present Illness: Reports  that she has been doing fairly well. Since yesterday she had a flare up of allergy symptoms, and is requesting med for this. she has been testing her blood sugars regularly and brings in her log which is good. Denies recent fever or chills. Denies  ear pain or sore throat. Denies chest congestion, or cough productive of sputum. Denies chest pain, palpitations, PND, orthopnea or leg swelling. Denies abdominal pain, nausea, vomitting, diarrhea or constipation. Denies change in bowel movements or bloody stool. Denies dysuria , frequency, incontinence or hesitancy. Denies  joint pain, swelling, or reduced mobility. Denies headaches, vertigo, seizures. Denies depression, anxiety or insomnia.    Preventive Screening-Counseling & Management  Alcohol-Tobacco     Smoking Cessation Counseling: yes  Current Medications (verified): 1)  Hydrochlorothiazide 12.5 Mg  Caps (Hydrochlorothiazide) .... Take 1 Capsule By Mouth Once A  Day 2)  Omeprazole 40 Mg Cpdr (Omeprazole) .... Take 1 Capsule By Mouth Once A Day 3)  Klor-Con M20 20 Meq Cr-Tabs (Potassium Chloride Crys Cr) .... Take 1 Tablet By Mouth Once A Day 4)  Alprazolam 1 Mg Tabs (Alprazolam) .... Take 1 Tab By Mouth At Bedtime 5)  Flonase 50 Mcg/act Susp (Fluticasone Propionate) .... One To Two Puffs Per Nostril Daily 6)  Phentermine Hcl 37.5 Mg Tabs (Phentermine Hcl) .... Take 1 Tablet By Mouth Once A Day 7)  Metformin Hcl 500 Mg Tabs (Metformin Hcl) .... One Tab By Mouth Bid 8)  Onetouch Test  Strp (Glucose Blood) .... Once Daily Testing 9)  Onetouch Lancets  Misc (Lancets) .... Once Daily Testing  Allergies (verified): 1)  ! Advil 2)  ! Ibuprofen  Review of Systems      See HPI General:  Complains of fatigue. Eyes:  Denies blurring and discharge. ENT:  Complains of nasal congestion. Derm:  Complains of lesion(s); c/o dark areas onlegs, not puritic, improved since she started diabetic meds. Neuro:  Denies headaches, seizures, and sensation of room spinning. Psych:  Complains of anxiety and depression; denies suicidal thoughts/plans, thoughts of violence, and unusual visions or sounds; has been under increased stress because of bad behavior of her son. Endo:  Denies cold intolerance, excessive hunger, excessive thirst, excessive urination, heat intolerance, polyuria, and weight change; tests  once daily, has log, fasting ranges  from 100 to 122. Heme:  Denies abnormal bruising and bleeding. Allergy:  Complains of hives or rash, itching eyes, seasonal allergies, and sneezing; increased symptomns this week, .  Physical Exam  General:  Well-developed,morbidly obese,in no acute distress; alert,appropriate and cooperative throughout examination Head:  Normocephalic and atraumatic without obvious abnormalities. No apparent alopecia or balding. Eyes:  No corneal or conjunctival inflammation noted. EOMI. Perrla. Funduscopic exam benign, without hemorrhages,  exudates or papilledema. Vision grossly normal. Ears:  External ear exam shows no significant lesions or deformities.  Otoscopic examination reveals clear canals, tympanic membranes are intact bilaterally without bulging, retraction, inflammation or discharge. Hearing is grossly normal bilaterally. Nose:  External nasal examination shows no deformity or inflammation. Nasal mucosa erythematous and edematouswithout lesions or exudates. Mouth:  Oral mucosa and oropharynx without lesions or exudates.  Teeth in good repair. Neck:  No deformities, masses, or tenderness noted. Chest Wall:  No deformities, masses, or tenderness noted. Breasts:  No mass, nodules, thickening, tenderness, bulging, retraction, inflamation, nipple discharge or skin changes noted.   Lungs:  Normal respiratory effort, chest expands symmetrically. Lungs are clear to auscultation, no crackles or wheezes. Heart:  Normal rate and regular rhythm. S1 and S2 normal without gallop, murmur, click, rub or other extra sounds. Abdomen:  Bowel sounds positive,abdomen soft and non-tender without masses, organomegaly or hernias noted. Rectal:  No external abnormalities noted. Normal sphincter tone. No rectal masses or tenderness. Genitalia:  Normal introitus for age, no external lesions, no vaginal discharge, mucosa pink and moist, no vaginal or cervical lesions, no vaginal atrophy, no friaility or hemorrhage, normal uterus size and position, no adnexal masses or tenderness Msk:  No deformity or scoliosis noted of thoracic or lumbar spine.   Pulses:  R and L carotid,radial,femoral,dorsalis pedis and posterior tibial pulses are full and equal bilaterally Extremities:  No clubbing, cyanosis, edema, or deformity noted with normal full range of motion of all joints.   Neurologic:  No cranial nerve deficits noted. Station and gait are normal. Plantar reflexes are down-going bilaterally. DTRs are symmetrical throughout. Sensory, motor and coordinative  functions appear intact. Skin:  Intact without suspicious lesions or rashes Cervical Nodes:  No lymphadenopathy noted Axillary Nodes:  No palpable lymphadenopathy Inguinal Nodes:  No significant adenopathy Psych:  Cognition and judgment appear intact. Alert and cooperative with normal attention span and concentration. No apparent delusions, illusions, hallucinations  Diabetes Management Exam:    Foot Exam (with socks and/or shoes not present):       Sensory-Monofilament:          Left foot: normal       Inspection:          Left foot: abnormal             Comments: callous          Right foot: abnormal             Comments: callou       Nails:          Left foot: thickened          Right foot: thickened   Impression & Recommendations:  Problem # 1:  DIABETES MELLITUS, TYPE II (ICD-250.00) Assessment Comment Only  Her updated medication list for this problem includes:    Metformin Hcl 500 Mg Tabs (Metformin hcl) ..... One tab by mouth bid  Labs Reviewed: Creat: 0.82 (08/11/2009)    Reviewed HgBA1c results: 7.0 (08/11/2009)  6.5 (08/10/2009)  Problem # 2:  SCREENING  FOR MALIGNANT NEOPLASM OF THE CERVIX (ICD-V76.2) Assessment: Comment Only  Orders: Pap Smear (16109)  Problem # 3:  ALLERGIC RHINITIS (ICD-477.9) Assessment: Deteriorated  Her updated medication list for this problem includes:    Flonase 50 Mcg/act Susp (Fluticasone propionate) ..... One to two puffs per nostril daily  Orders: Depo- Medrol 80mg  (J1040) Admin of Therapeutic Inj  intramuscular or subcutaneous (60454)  Problem # 4:  OBESITY, UNSPECIFIED (ICD-278.00) Assessment: Unchanged  Ht: 66.5 (11/24/2009)   Wt: 323 (11/24/2009)   BMI: 51.54 (11/24/2009)  Problem # 5:  HYPERTENSION (ICD-401.9) Assessment: Unchanged  Her updated medication list for this problem includes:    Hydrochlorothiazide 12.5 Mg Caps (Hydrochlorothiazide) .Marland Kitchen... Take 1 capsule by mouth once a day  BP today: 104/74 Prior BP:  108/72 (08/10/2009)  Labs Reviewed: K+: 4.3 (08/11/2009) Creat: : 0.82 (08/11/2009)   Chol: 192 (08/11/2009)   HDL: 55 (08/11/2009)   LDL: 115 (08/11/2009)   TG: 108 (08/11/2009)  Problem # 6:  NICOTINE ADDICTION (ICD-305.1) Assessment: Unchanged  Her updated medication list for this problem includes:    Nicoderm Cq 14 Mg/24hr Pt24 (Nicotine) .Marland Kitchen... X 2 weeks    Nicoderm Cq 7 Mg/24hr Pt24 (Nicotine) .Marland Kitchen... X 2 weeks  Encouraged smoking cessation and discussed different methods for smoking cessation.   Complete Medication List: 1)  Hydrochlorothiazide 12.5 Mg Caps (Hydrochlorothiazide) .... Take 1 capsule by mouth once a day 2)  Omeprazole 40 Mg Cpdr (Omeprazole) .... Take 1 capsule by mouth once a day 3)  Klor-con M20 20 Meq Cr-tabs (Potassium chloride crys cr) .... Take 1 tablet by mouth once a day 4)  Alprazolam 1 Mg Tabs (Alprazolam) .... Take 1 tab by mouth at bedtime 5)  Flonase 50 Mcg/act Susp (Fluticasone propionate) .... One to two puffs per nostril daily 6)  Phentermine Hcl 37.5 Mg Tabs (Phentermine hcl) .... Take 1 tablet by mouth once a day 7)  Metformin Hcl 500 Mg Tabs (Metformin hcl) .... One tab by mouth bid 8)  Onetouch Test Strp (Glucose blood) .... Once daily testing 9)  Onetouch Lancets Misc (Lancets) .... Once daily testing 10)  Nicoderm Cq 14 Mg/24hr Pt24 (Nicotine) .... X 2 weeks 11)  Nicoderm Cq 7 Mg/24hr Pt24 (Nicotine) .... X 2 weeks 12)  Lovastatin 40 Mg Tabs (Lovastatin) .... Take 1 tab by mouth at bedtime  Other Orders: T-Basic Metabolic Panel [phone]) T- Hemoglobin A1C [phone]) T-Vitamin D (25-Hydroxy) [phone]) Hemoccult Guaiac-1 spec.(in office) [phone])  Patient Instructions: 1)  F/u in 6 weeks 2)  Tobacco is very bad for your health and your loved ones! You Should stop smoking!. 3)  Stop Smoking Tips: Choose a Quit date. Cut down before the Quit date. decide what you will do as a substitute when you feel the urge to  smoke(gum,toothpick,exercise). 4)  It is important that you exercise regularly at least 20 minutes 5 times a week. If you develop chest pain, have severe difficulty breathing, or feel very tired , stop exercising immediately and seek medical attention. 5)  You need to lose weight. Consider a lower calorie diet and regular exercise.  Prescriptions: LOVASTATIN 40 MG TABS (LOVASTATIN) Take 1 tab by mouth at bedtime  #90 x 1   Entered by:   Everitt Amber LPN   Authorized by:   Syliva Overman MD   Signed by:   Syliva Overman MD on 11/24/2009   Method used:   Handwritten   RxID:   [id number] NICODERM CQ 7 MG/24HR PT24 (NICOTINE) x 2 weeks  #  2wks x 0   Entered by:   Everitt Amber LPN   Authorized by:   Syliva Overman MD   Signed by:   Syliva Overman MD on 11/24/2009   Method used:   Handwritten   RxID:   [id number] NICODERM CQ 14 MG/24HR PT24 (NICOTINE) x 2 weeks  #2wks x 0   Entered by:   Everitt Amber LPN   Authorized by:   Syliva Overman MD   Signed by:   Syliva Overman MD on 11/24/2009   Method used:   Handwritten   RxID:   [id number] PHENTERMINE HCL 37.5 MG TABS (PHENTERMINE HCL) Take 1 tablet by mouth once a day  #30 x 0   Entered by:   Everitt Amber LPN   Authorized by:   Syliva Overman MD   Signed by:   Everitt Amber LPN on 72/53/6644   Method used:   Handwritten   RxID:   [id number]    Medication Administration  Injection # 1:    Medication: Depo- Medrol 80mg     Diagnosis: ALLERGIC RHINITIS (ICD-477.9)    Route: IM    Site: RUOQ gluteus    Exp Date: 06/2010    Lot #: [id number]    Mfr: [employer]    Comments: 80mg  given     Patient tolerated injection without complications    Given by: Everitt Amber LPN (Nov 25, 3327 2:43 PM)  Orders Added: 1)  Est. Patient 40-64 years [99396] 2)  T-Basic Metabolic Panel [phone] 3)  T- Hemoglobin A1C [83036-23375] 4)  T-Vitamin D (25-Hydroxy) [phone] 5)  Depo- Medrol 80mg  [J1040] 6)  Admin of  Therapeutic Inj  intramuscular or subcutaneous [96372] 7)  Pap Smear [88150] 8)  Hemoccult Guaiac-1 spec.(in office) [82270]  Laboratory Results    Stool - Occult Blood Hemmoccult #1: negative Date: 11/24/2009 Comments: [id number] 9r 8/11 118 1012

## 2010-08-24 NOTE — Progress Notes (Signed)
Summary: speak doc  Phone Note Call from Patient   Summary of Call: pt would like to speak with dr. Lodema Hong. please call her. [phone] Initial call taken by: Rudene Anda,  April 06, 2010 11:14 AM  Follow-up for Phone Call        pt in to be seen Follow-up by: Syliva Overman MD,  April 06, 2010 3:36 PM

## 2010-08-24 NOTE — Progress Notes (Signed)
Summary: [facility]   Imported By: Lind Guest 03/15/2010 13:47:26  _____________________________________________________________________  External Attachment:    Type:   Image     Comment:   External Document

## 2010-08-24 NOTE — Progress Notes (Signed)
Summary: MEDICINE  Phone Note Call from Patient   Summary of Call: CALL HER AT [phone]  MEDICINE  YEAST INFECTION Initial call taken by: Lind Guest,  May 26, 2010 2:05 PM  Follow-up for Phone Call        patient complains of vaginal itching and is sure it is yeast Follow-up by: Adella Hare LPN,  May 27, 2010 4:43 PM  Additional Follow-up for Phone Call Additional follow up Details #1::        i recommend oTC monistat , that works very well for yeast Additional Follow-up by: Syliva Overman MD,  May 27, 2010 5:16 PM    Additional Follow-up for Phone Call Additional follow up Details #2::    patient aware Follow-up by: Adella Hare LPN,  May 28, 2010 4:03 PM

## 2010-08-24 NOTE — Progress Notes (Signed)
  Phone Note Call from Patient   Caller: Patient Summary of Call: should she continue to take cholesterol meds? she feels symptoms are from the med Initial call taken by: Adella Hare LPN,  April 07, 2010 8:39 AM  Follow-up for Phone Call        tell her to hold off on choplesterol med pls Follow-up by: Syliva Overman MD,  April 07, 2010 9:03 AM  Additional Follow-up for Phone Call Additional follow up Details #1::        patient aware Additional Follow-up by: Adella Hare LPN,  April 07, 2010 9:06 AM

## 2010-08-24 NOTE — Progress Notes (Signed)
Summary: rx for hot flashes  Phone Note Call from Patient   Summary of Call: needs something for hot flashes [phone] Initial call taken by: Rudene Anda,  Nov 25, 2009 11:43 AM  Follow-up for Phone Call        advise pt the recommendation is to dress as cool as possible, exercise regularly, and avoid excessively hot beverages, there a re antidepressants which do help , that would be the better way to go, hormones are not safe for logterm use Follow-up by: Syliva Overman MD,  Nov 25, 2009 1:14 PM  Additional Follow-up for Phone Call Additional follow up Details #1::        patient willing to try antidepressants Additional Follow-up by: Adella Hare LPN,  Nov 25, 1608 2:10 PM    Additional Follow-up for Phone Call Additional follow up Details #2::    venlafaxine sent in (effexor) pls let her know Follow-up by: Syliva Overman MD,  Nov 26, 2009 6:15 AM  Additional Follow-up for Phone Call Additional follow up Details #3:: Details for Additional Follow-up Action Taken: patient aware Additional Follow-up by: Adella Hare LPN,  Nov 27, 9602 9:00 AM  New/Updated Medications: VENLAFAXINE HCL 37.5 MG XR24H-TAB (VENLAFAXINE HCL) Take 1 tablet by mouth once a day Prescriptions: VENLAFAXINE HCL 37.5 MG XR24H-TAB (VENLAFAXINE HCL) Take 1 tablet by mouth once a day  #30 x 3   Entered and Authorized by:   Syliva Overman MD   Signed by:   Syliva Overman MD on 11/26/2009   Method used:   Electronically to        [facility] (retail)       [address]       [place], [place]  [address]       Ph: [phone]       Fax: [phone]   RxID:   [id number]

## 2010-08-24 NOTE — Progress Notes (Signed)
Summary: lab results  Phone Note Call from Patient   Summary of Call: pt needs results on her labs she had done. [phone] Initial call taken by: Rudene Anda,  August 20, 2009 11:39 AM  Follow-up for Phone Call        patient aware Follow-up by: Worthy Keeler LPN,  August 20, 2009 5:34 PM

## 2010-08-24 NOTE — Letter (Signed)
Summary: Letter  Letter   Imported By: Lind Guest 08/13/2009 13:48:37  _____________________________________________________________________  External Attachment:    Type:   Image     Comment:   External Document

## 2010-08-24 NOTE — Assessment & Plan Note (Signed)
Summary: office visit   Vital Signs:  Patient profile:   45 year old female Menstrual status:  postmenopausal Height:      66.5 inches Weight:      325 pounds BMI:     51.86 O2 Sat:      96 % Pulse rate:   89 / minute Pulse rhythm:   regular Resp:     16 per minute BP sitting:   110 / 72  (left arm) Cuff size:   xl  Vitals Entered By: Everitt Amber LPN (March 22, 2010 11:06 AM)  Nutrition Counseling: Patient's BMI is greater than 25 and therefore counseled on weight management options. CC: wants something for her hot flashes and also her sinuses. Wants to know if she can have fluconazole sent in to the pharmacy to hold for when she gets yeast infections   Primary Care Provider:  Syliva Overman MD  CC:  wants something for her hot flashes and also her sinuses. Wants to know if she can have fluconazole sent in to the pharmacy to hold for when she gets yeast infections.  History of Present Illness: Reports  that she ahs notbeen doing well because of uncontrolled hot falshes , which even disturb her sleep. he was intolerant of effexor and stopped taking it. Denies recent fever but has had  chills. Reports maxillary  sinus pressure,denies  nasal congestion , ear pain or sore throat. Denies chest congestion, or cough productive of sputum. Denies chest pain, palpitations, PND, orthopnea or leg swelling. Denies abdominal pain, nausea, vomitting, diarrhea or constipation. Denies change in bowel movements or bloody stool. Denies dysuria , frequency, incontinence or hesitancy. Denies  joint pain, swelling, or reduced mobility. Denies headaches, vertigo, seizures.  Denies  rash, lesions, or itch.     Preventive Screening-Counseling & Management  Alcohol-Tobacco     Smoking Cessation Counseling: yes  Current Medications (verified): 1)  Hydrochlorothiazide 12.5 Mg  Caps (Hydrochlorothiazide) .... Take 1 Capsule By Mouth Once A Day 2)  Omeprazole 40 Mg Cpdr (Omeprazole) .... Take  1 Capsule By Mouth Once A Day 3)  Klor-Con M20 20 Meq Cr-Tabs (Potassium Chloride Crys Cr) .... Take 1 Tablet By Mouth Once A Day 4)  Alprazolam 1 Mg Tabs (Alprazolam) .... Take 1 Tab By Mouth At Bedtime 5)  Phentermine Hcl 37.5 Mg Tabs (Phentermine Hcl) .... Take 1 Tablet By Mouth Once A Day 6)  Metformin Hcl 500 Mg Tabs (Metformin Hcl) .... One Tab By Mouth Bid 7)  Onetouch Test  Strp (Glucose Blood) .... Once Daily Testing 8)  Onetouch Lancets  Misc (Lancets) .... Once Daily Testing 9)  Vitamin D (Ergocalciferol) 50000 Unit Caps (Ergocalciferol) .... One Cap By Mouth Every Week 10)  Pravastatin Sodium 40 Mg Tabs (Pravastatin Sodium) .... One Tab By Mouth At Bedtime  Discontinue Lovastatin  Allergies (verified): 1)  ! Advil 2)  ! Ibuprofen  Review of Systems      See HPI General:  Complains of fatigue, sleep disorder, and sweats; increased hot flashes. Eyes:  Denies blurring and discharge. ENT:  Complains of nasal congestion and postnasal drainage; 2 week h/o right sided headache with post nasal drainage, no fever or chioills. Psych:  Complains of anxiety, depression, and mental problems; denies easily tearful, irritability, suicidal thoughts/plans, thoughts of violence, and thoughts /plans of harming others. Endo:  Denies excessive thirst and excessive urination. Heme:  Denies abnormal bruising and bleeding. Allergy:  Denies hives or rash and itching eyes.  Physical Exam  General:  Well-developed,morbidly obese,in no acute distress; alert,appropriate and cooperative throughout examination HEENT: No facial asymmetry,  EOMI, maxillary sinus tenderness, TM's Clear, oropharynx  pink and moist.   Chest: Clear to auscultation bilaterally.  CVS: S1, S2, No murmurs, No S3.   Abd: Soft, Nontender.  MS: Adequate ROM spine, hips, shoulders and knees.  Ext: No edema.   CNS: CN 2-12 intact, power tone and sensation normal throughout.   Skin: Intact, no visible lesions or rashes.  Psych:  Good eye contact, normal affect.  Memory intact, not anxious or depressed appearing.    Impression & Recommendations:  Problem # 1:  DIABETES MELLITUS, TYPE II (ICD-250.00) Assessment Comment Only  Her updated medication list for this problem includes:    Metformin Hcl 500 Mg Tabs (Metformin hcl) ..... One tab by mouth bid    Benazepril Hcl 10 Mg Tabs (Benazepril hcl) .[facility]... Take 1 tablet by mouth once a day  Orders: T-Urine Microalbumin w/creat. ratio [id number]) T- Hemoglobin A1C [phone])  Labs Reviewed: Creat: 0.72 (02/05/2010)    Reviewed HgBA1c results: 6.5 (11/24/2009)  7.0 (08/11/2009)  Problem # 2:  ALLERGY UNSPECIFIED NOT ELSEWHERE CLASSIFIED (ICD-995.3) Assessment: Deteriorated  Problem # 3:  NICOTINE ADDICTION (ICD-305.1) Assessment: Unchanged  The following medications were removed from the medication list:    Nicoderm Cq 14 Mg/24hr Pt24 (Nicotine) .Marland Kitchen... X 2 weeks    Nicoderm Cq 7 Mg/24hr Pt24 (Nicotine) .Marland Kitchen... X 2 weeks  Encouraged smoking cessation and discussed different methods for smoking cessation.   Problem # 4:  OBESITY, UNSPECIFIED (ICD-278.00) Assessment: Unchanged  Ht: 66.5 (03/22/2010)   Wt: 325 (03/22/2010)   BMI: 51.86 (03/22/2010)  Problem # 5:  ACUTE MAXILLARY SINUSITIS (ICD-461.0) Assessment: Comment Only  The following medications were removed from the medication list:    Flonase 50 Mcg/act Susp (Fluticasone propionate) ..... One to two puffs per nostril daily Her updated medication list for this problem includes:    Penicillin V Potassium 500 Mg Tabs (Penicillin v potassium) .Marland Kitchen... Take 1 tablet by mouth three times a day  Problem # 6:  HYPERTENSION (ICD-401.9) Assessment: Unchanged  The following medications were removed from the medication list:    Hydrochlorothiazide 12.5 Mg Caps (Hydrochlorothiazide) .Marland Kitchen... Take 1 capsule by mouth once a day Her updated medication list for this problem includes:    Benazepril Hcl 10 Mg  Tabs (Benazepril hcl) .Marland Kitchen... Take 1 tablet by mouth once a day  Orders: T-Basic Metabolic Panel ([id number])  BP today: 110/72 Prior BP: 104/74 (11/24/2009)  Labs Reviewed: K+: 4.0 (02/05/2010) Creat: : 0.72 (02/05/2010)   Chol: 192 (08/11/2009)   HDL: 55 (08/11/2009)   LDL: 115 (08/11/2009)   TG: 108 (08/11/2009)  Complete Medication List: 1)  Omeprazole 40 Mg Cpdr (Omeprazole) .... Take 1 capsule by mouth once a day 2)  Alprazolam 1 Mg Tabs (Alprazolam) .... Take 1 tab by mouth at bedtime 3)  Phentermine Hcl 37.5 Mg Tabs (Phentermine hcl) .... Take 1 tablet by mouth once a day 4)  Metformin Hcl 500 Mg Tabs (Metformin hcl) .... One tab by mouth bid 5)  Onetouch Test Strp (Glucose blood) .... Once daily testing 6)  Onetouch Lancets Misc (Lancets) .... Once daily testing 7)  Vitamin D (ergocalciferol) 50000 Unit Caps (Ergocalciferol) .... One cap by mouth every week 8)  Pravastatin Sodium 40 Mg Tabs (Pravastatin sodium) .... One tab by mouth at bedtime  discontinue lovastatin 9)  Benazepril Hcl 10 Mg Tabs (Benazepril hcl) .... Take 1 tablet by mouth  once a day 10)  Penicillin V Potassium 500 Mg Tabs (Penicillin v potassium) .... Take 1 tablet by mouth three times a day 11)  Fluconazole 150 Mg Tabs (Fluconazole) .... Take 1 tablet by mouth once a day as needed  Other Orders: T-Lipid Profile [id number]) T-Hepatic Function [id number]) T-Vitamin D (25-Hydroxy) [phone])  Patient Instructions: 1)  Please schedule a follow-up appointment in 3 months. 2)  BMP prior to visit, ICD-9: 3)  Hepatic Panel prior to visit, ICD-9: 4)  Lipid Panel prior to visit, ICD-9:   fasting in 3 weeks 5)  HbgA1C prior to visit, ICD-9: 6)  Vit D 7)  It is important that you exercise regularly at least 20 minutes 5 times a week. If you develop chest pain, have severe difficulty breathing, or feel very tired , stop exercising immediately and seek medical attention. 8)  You need to lose weight. Consider  a lower calorie diet and regular exercise.  9)  Check your blood sugars regularly. If your readings are usually above250: or below 70 you should contact our office. 10)  Tobacco is very bad for your health and your loved ones! You Should stop smoking!. 11)  Stop Smoking Tips: Choose a Quit date. Cut down before the Quit date. decide what you will do as a substitute when you feel the urge to smoke(gum,toothpick,exercise). 12)  Meds are sent in for sinus infection Prescriptions: PHENTERMINE HCL 37.5 MG TABS (PHENTERMINE HCL) Take 1 tablet by mouth once a day  #30 x 2   Entered by:   Adella Hare LPN   Authorized by:   Syliva Overman MD   Signed by:   Adella Hare LPN on 29/52/8413   Method used:   Printed then faxed to ...       [facility]       [address]       [place], [place]  [address]       Ph: [phone]       Fax: [phone]   RxID:   [id number] FLUCONAZOLE 150 MG TABS (FLUCONAZOLE) Take 1 tablet by mouth once a day as needed  #3 x 0   Entered and Authorized by:   Syliva Overman MD   Signed by:   Syliva Overman MD on 03/22/2010   Method used:   Printed then faxed to ...       [facility]       [address]       [place], [place]  [address]       Ph: [phone]       Fax: [phone]   RxID:   [id number] PENICILLIN V POTASSIUM 500 MG TABS (PENICILLIN V POTASSIUM) Take 1 tablet by mouth three times a day  #21 x 0   Entered and Authorized by:   Syliva Overman MD   Signed by:   Syliva Overman MD on 03/22/2010   Method used:   Printed then faxed to ...       [facility]       [address]       [place], [place]  [address]       Ph: [phone]       Fax: [phone]   RxID:   [id number] BENAZEPRIL HCL 10 MG TABS (BENAZEPRIL HCL) Take 1 tablet  by mouth once a day  #30 x 3   Entered and Authorized by:   Syliva Overman MD    Signed by:   Syliva Overman MD on 03/22/2010   Method used:   Printed then faxed to ...       [facility]       [address]       [place], [place]  [address]       Ph: [phone]       Fax: [phone]   RxID:   [id number]

## 2010-08-26 NOTE — Assessment & Plan Note (Signed)
Summary: office visit   Vital Signs:  Patient profile:   45 year old female Menstrual status:  postmenopausal Height:      66.5 inches Weight:      333.50 pounds BMI:     53.21 O2 Sat:      97 % on Room air Pulse rate:   80 / minute Pulse rhythm:   regular Resp:     16 per minute BP sitting:   100 / 70  (left arm)  Vitals Entered By: Adella Hare LPN (July 12, 2010 8:06 AM)  Nutrition Counseling: Patient's BMI is greater than 25 and therefore counseled on weight management options.  O2 Flow:  Room air CC: follow-up visit Is Patient Diabetic? Yes Did you bring your meter with you today? No   Primary Care Provider:  Syliva Overman MD  CC:  follow-up visit.  History of Present Illness: Reports  that she has been  doing very  well. Denies recent fever or chills. Denies sinus pressure, nasal congestion , ear pain or sore throat. She does note swelling of the right jaw in the past 1 week, has dental caries , and needs this addressed. She denies fever or chills Denies chest congestion, or cough productive of sputum. Denies chest pain, palpitations, PND, orthopnea or leg swelling. Denies abdominal pain, nausea, vomitting, diarrhea or constipation. Denies change in bowel movements or bloody stool. Denies dysuria , frequency, incontinence or hesitancy. Denies  joint pain, swelling, or reduced mobility. Denies headaches, vertigo, seizures. Denies depression, anxiety or insomnia. Denies  rash, lesions, or itch. Pt is vconcerned about weight gain and continued nicotine use.She wants to reverse both.   Preventive Screening-Counseling & Management  Alcohol-Tobacco     Smoking Cessation Counseling: yes  Current Medications (verified): 1)  Omeprazole 40 Mg Cpdr (Omeprazole) .... Take 1 Capsule By Mouth Once A Day 2)  Alprazolam 1 Mg Tabs (Alprazolam) .... Take 1 Tab By Mouth At Bedtime 3)  Metformin Hcl 500 Mg Tabs (Metformin Hcl) .... One Tab By Mouth Bid 4)  Onetouch  Test  Strp (Glucose Blood) .... Once Daily Testing 5)  Onetouch Lancets  Misc (Lancets) .... Once Daily Testing 6)  Vitamin D (Ergocalciferol) 50000 Unit Caps (Ergocalciferol) .... One Cap By Mouth Every Week 7)  Losartan Potassium 25 Mg Tabs (Losartan Potassium) .... Take 1 Tablet By Mouth Once A Day  Allergies (verified): 1)  ! Advil 2)  ! Ibuprofen  Review of Systems      See HPI Eyes:  Denies blurring, discharge, eye pain, and red eye. Endo:  Denies cold intolerance, excessive hunger, excessive thirst, excessive urination, heat intolerance, polyuria, and weight change; bedtime sugars are seldom over 120. Heme:  Denies abnormal bruising, bleeding, and enlarge lymph nodes. Allergy:  Denies hives or rash, itching eyes, persistent infections, and seasonal allergies.  Physical Exam  General:  Well-developedobese,in no acute distress; alert,appropriate and cooperative throughout examination.  HEENT: No facial asymmetry,  EOMI, No sinus tenderness, TM's Clear, oropharynx  pink and moist. Right jaw slightly swollen, positive caries in right maxillary area  Chest: Clear to auscultation bilaterally.  CVS: S1, S2, No murmurs, No S3.   Abd: Soft, Nontender.  MS: Adequate ROM spine, hips, shoulders and knees. decreased in neck Ext: No edema.   CNS: CN 2-12 intact, power tone and sensation normal throughout.   Skin: Intact, no visible lesions or rashes.  Psych: Good eye contact, normal affect.  Memory intact,    Impression & Recommendations:  Problem #  1:  NICOTINE ADDICTION (ICD-305.1) Assessment Unchanged  Her updated medication list for this problem includes:    Nicoderm Cq 21 Mg/24hr Pt24 (Nicotine) .Marland Kitchen... Apply one patch daily for 2 weeks, start 07/12/2010    Nicoderm Cq 14 Mg/24hr Pt24 (Nicotine) .Marland Kitchen... Apply one patch daily for 2 weeks starting on 07/26/2010    Nicoderm Cq 7 Mg/24hr Pt24 (Nicotine) .Marland Kitchen... Apply one patch daily for 2 weeks , starting on 09/09/2010  Encouraged  smoking cessation and discussed different methods for smoking cessation.   Problem # 2:  DIABETES MELLITUS, TYPE II (ICD-250.00) Assessment: Comment Only  Her updated medication list for this problem includes:    Metformin Hcl 500 Mg Tabs (Metformin hcl) ..... One tab by mouth bid    Losartan Potassium 25 Mg Tabs (Losartan potassium) .Marland Kitchen... Take 1 tablet by mouth once a day Patient advised to reduce carbs and sweets, commit to regular physical activity, take meds as prescribed, test blood sugars as directed, and attempt to lose weight , to improve blood sugar control.  Orders: T- Hemoglobin A1C ([id number])  Labs Reviewed: Creat: 0.64 (04/26/2010)    Reviewed HgBA1c results: 6.1 (04/26/2010)  6.5 (11/24/2009)  Problem # 3:  OTHER DENTAL CARIES (ICD-521.09) Assessment: Comment Only keflex prescribed and dental eval advised  Problem # 4:  GERD (ICD-530.81) Assessment: Unchanged  Her updated medication list for this problem includes:    Omeprazole 40 Mg Cpdr (Omeprazole) .Marland Kitchen... Take 1 capsule by mouth once a day  Problem # 5:  ADJ DISORDER WITH MIXED ANXIETY & DEPRESSED MOOD (ICD-309.28) Assessment: Improved reduced dose of xanax as discussed  Problem # 6:  OTHER DENTAL CARIES (ICD-521.09)  Complete Medication List: 1)  Omeprazole 40 Mg Cpdr (Omeprazole) .... Take 1 capsule by mouth once a day 2)  Metformin Hcl 500 Mg Tabs (Metformin hcl) .... One tab by mouth bid 3)  Onetouch Test Strp (Glucose blood) .... Once daily testing 4)  Onetouch Lancets Misc (Lancets) .... Once daily testing 5)  Losartan Potassium 25 Mg Tabs (Losartan potassium) .... Take 1 tablet by mouth once a day 6)  Nicoderm Cq 21 Mg/24hr Pt24 (Nicotine) .... Apply one patch daily for 2 weeks, start 07/12/2010 7)  Nicoderm Cq 14 Mg/24hr Pt24 (Nicotine) .... Apply one patch daily for 2 weeks starting on 07/26/2010 8)  Nicoderm Cq 7 Mg/24hr Pt24 (Nicotine) .... Apply one patch daily for 2 weeks , starting on  09/09/2010 9)  Alprazolam 0.5 Mg Tabs (Alprazolam) .... One taablet at bedtime as needed for insomnia and anxiety 10)  Calcium With Vit D 1200mg /1000iu  .... One daily 11)  Keflex 250 Mg Caps (Cephalexin) .... Take 1 capsule by mouth two times a day 12)  Fluconazole 150 Mg Tabs (Fluconazole) .... Take 1 tablet by mouth once a day as needed  Other Orders: T-Basic Metabolic Panel [id number]) T-Hepatic Function [id number]) T-Lipid Profile [phone]) T-CBC w/Diff [id number]) T-TSH [id number])  Patient Instructions: 1)  F/U early March 2)  Tobacco is very bad for your health and your loved ones! You Should stop smoking!. 3)  Stop Smoking Tips: Choose a Quit date. Cut down before the Quit date. decide what you will do as a substitute when you feel the urge to smoke(gum,toothpick,exercise).No SMOKING with the patch. 4)  It is important that you exercise regularly at least 30 minutes 5 times a week. If you develop chest pain, have severe difficulty breathing, or feel very tired , stop exercising immediately and seek medical attention. 5)  You need  to lose weight. Consider a lower calorie diet and regular exercise.  6)  Reduced dose of xanax as discussed. 7)  BMP prior to visit, ICD-9: 8)  Hepatic Panel prior to visit, ICD-9: 9)  Lipid Panel prior to visit, ICD-9: 10)  TSH prior to visit, ICD-9:  fasting end Feb 11)  CBC w/ Diff prior to visit, ICD-9: 12)  HbgA1C prior to visit, ICD-9: Prescriptions: FLUCONAZOLE 150 MG TABS (FLUCONAZOLE) Take 1 tablet by mouth once a day as needed  #2 x 0   Entered and Authorized by:   Syliva Overman MD   Signed by:   Syliva Overman MD on 07/12/2010   Method used:   Electronically to        [facility] ([facility])       [address]       [place], [place]  [address]       Ph: [phone]       Fax: [phone]   RxID:   [id number] KEFLEX 250 MG CAPS (CEPHALEXIN) Take 1 capsule by mouth two times a day  #14  x 0   Entered and Authorized by:   Syliva Overman MD   Signed by:   Syliva Overman MD on 07/12/2010   Method used:   Electronically to        [facility] (retail)       [address]       [place], [place]  [address]       Ph: [phone]       Fax: [phone]   RxID:   [id number] ALPRAZOLAM 0.5 MG TABS (ALPRAZOLAM) one taablet at bedtime as needed for insomnia and anxiety  #16 x 3   Entered and Authorized by:   Syliva Overman MD   Signed by:   Syliva Overman MD on 07/12/2010   Method used:   Printed then faxed to ...       [facility]       [address]       [place], [place]  [address]       Ph: [phone]       Fax: [phone]   RxID:   [id number] NICODERM CQ 7 MG/24HR PT24 (NICOTINE) apply one patch daily for 2 weeks , starting on 09/09/2010  #14 x 0   Entered and Authorized by:   Syliva Overman MD   Signed by:   Syliva Overman MD on 07/12/2010   Method used:   Electronically to        [facility] (retail)       [address]       [place], [place]  [address]       Ph: [phone]       Fax: [phone]   RxID:   [id number] NICODERM CQ 14 MG/24HR PT24 (NICOTINE) apply one patch daily for 2 weeks starting on 07/26/2010  #14 x 0   Entered and Authorized by:   Syliva Overman MD   Signed by:   Syliva Overman MD on 07/12/2010   Method used:   Electronically to        [facility] (ret[facility])       [address]       [place]  [place], [place]  [address]       Ph: [phone]       Fax: [phone]   RxID:   [id number] NICODERM CQ 21 MG/24HR PT24 (NICOTINE) apply one patch daily for 2 weeks, start 07/12/2010  #14 x 0   Entered and Authorized by:   Syliva Overman MD   Signed by:   Syliva Overman MD on 07/12/2010   Method used:   Electronically to        [facility] (retail)       [address]       [place], [place]  [address]       Ph: [phone]       Fax: [phone]   RxID:   [id number]    Orders Added: 1)  Est. Patient Level IV [[address]] 2)  T-Basic Metabolic Panel [phone] 3)  T-Hepatic Function [80076-22960] 4)  T-Lipid Profile [[id number]] 5)  T-CBC w/Diff [[id number]] 6)  T-TSH [[id number]] 7)  T- Hemoglobin A1C [83036-23375]

## 2010-08-26 NOTE — Letter (Signed)
Summary: dose reduction  dose reduction   Imported By: Lind Guest 07/12/2010 13:16:30  _____________________________________________________________________  External Attachment:    Type:   Image     Comment:   External Document

## 2010-09-10 ENCOUNTER — Telehealth: Payer: Self-pay | Admitting: Family Medicine

## 2010-09-21 NOTE — Progress Notes (Signed)
Summary: diabeticshoes  Phone Note Call from Patient   Summary of Call: pt would like to know how she can get some diabetic shoes. [phone] Initial call taken by: Rudene Anda,  September 10, 2010 10:22 AM  Follow-up for Phone Call        does this patient qualify for diabetic shoes? Follow-up by: Everitt Amber LPN,  September 10, 2010 3:27 PM  Additional Follow-up for Phone Call Additional follow up Details #1::        I do not think she will if she needs to have neuropathy, her ins may not require these modifiers, I am not sure. Best for her to wait on next visit when I can specificlly do a foot exam looking for the qualifiers Additional Follow-up by: Syliva Overman MD,  September 13, 2010 1:05 PM    Additional Follow-up for Phone Call Additional follow up Details #2::    Patient aware Follow-up by: Everitt Amber LPN,  September 13, 2010 1:30 PM

## 2010-09-27 ENCOUNTER — Encounter: Payer: Self-pay | Admitting: Family Medicine

## 2010-09-28 ENCOUNTER — Ambulatory Visit (INDEPENDENT_AMBULATORY_CARE_PROVIDER_SITE_OTHER): Payer: Managed Care, Other (non HMO) | Admitting: Family Medicine

## 2010-09-28 ENCOUNTER — Other Ambulatory Visit: Payer: Self-pay | Admitting: Family Medicine

## 2010-09-28 ENCOUNTER — Encounter: Payer: Self-pay | Admitting: Family Medicine

## 2010-09-28 DIAGNOSIS — I10 Essential (primary) hypertension: Secondary | ICD-10-CM

## 2010-09-28 DIAGNOSIS — E669 Obesity, unspecified: Secondary | ICD-10-CM

## 2010-09-28 DIAGNOSIS — E119 Type 2 diabetes mellitus without complications: Secondary | ICD-10-CM

## 2010-09-28 DIAGNOSIS — J309 Allergic rhinitis, unspecified: Secondary | ICD-10-CM

## 2010-09-28 DIAGNOSIS — F17200 Nicotine dependence, unspecified, uncomplicated: Secondary | ICD-10-CM

## 2010-09-28 LAB — CBC WITH DIFFERENTIAL/PLATELET
Basophils Relative: 0 % (ref 0–1)
Eosinophils Absolute: 0.2 10*3/uL (ref 0.0–0.7)
Lymphs Abs: 3.1 10*3/uL (ref 0.7–4.0)
MCH: 22.3 pg — ABNORMAL LOW (ref 26.0–34.0)
MCHC: 31.2 g/dL (ref 30.0–36.0)
Neutrophils Relative %: 45 % (ref 43–77)
Platelets: 257 10*3/uL (ref 150–400)
RBC: 5.79 MIL/uL — ABNORMAL HIGH (ref 3.87–5.11)

## 2010-09-28 LAB — HEMOGLOBIN A1C: Mean Plasma Glucose: 140 mg/dL — ABNORMAL HIGH (ref <117)

## 2010-09-28 LAB — BASIC METABOLIC PANEL
Calcium: 9.2 mg/dL (ref 8.4–10.5)
Sodium: 141 mEq/L (ref 135–145)

## 2010-09-28 LAB — TSH: TSH: 2.107 u[IU]/mL (ref 0.350–4.500)

## 2010-09-29 LAB — CONVERTED CEMR LAB
CO2: 24 meq/L (ref 19–32)
Glucose, Bld: 97 mg/dL (ref 70–99)
Lymphocytes Relative: 45 % (ref 12–46)
Lymphs Abs: 3.1 10*3/uL (ref 0.7–4.0)
Neutrophils Relative %: 45 % (ref 43–77)
Platelets: 257 10*3/uL (ref 150–400)
Potassium: 4.5 meq/L (ref 3.5–5.3)
Sodium: 141 meq/L (ref 135–145)
TSH: 2.107 microintl units/mL (ref 0.350–4.500)
WBC: 6.9 10*3/uL (ref 4.0–10.5)

## 2010-10-05 NOTE — Letter (Signed)
Summary: diabetic shoes  diabetic shoes   Imported By: Lind Guest 09/28/2010 16:54:30  _____________________________________________________________________  External Attachment:    Type:   Image     Comment:   External Document

## 2010-10-05 NOTE — Letter (Signed)
Summary: Out of Work  [facility]  [address]   [place], [place] [address]   Phone: [phone]  Fax: [phone]    September 28, 2010   Employee:  CHERRISE OCCHIPINTI    To Whom It May Concern:   For Medical reasons, please excuse the above named employee from work for the following dates:  Start:   09/28/10  End:   09/29/10 to return with no restrictions  If you need additional information, please feel free to contact our office.         Sincerely,    Milus Mallick. Treylan Mcclintock,MD

## 2010-10-12 NOTE — Assessment & Plan Note (Signed)
Summary: follow up   Vital Signs:  Patient profile:   45 year old female Menstrual status:  postmenopausal Height:      66.5 inches Weight:      335 pounds BMI:     53.45 O2 Sat:      96 % Pulse rate:   78 / minute Pulse rhythm:   regular Resp:     16 per minute BP sitting:   120 / 80  (left arm) Cuff size:   x  Vitals Entered By: Everitt Amber LPN (September 27, 628 8:05 AM)  Nutrition Counseling: Patient's BMI is greater than 25 and therefore counseled on weight management options. CC: c/o sinus drainage (clear), headache   Primary Care Provider:  Syliva Overman MD  CC:  c/o sinus drainage (clear) and headache.  History of Present Illness: Reports  that she has been doing fairly well, except for frustratration re weight gain, continued smoking and increased allergy symptoms. Denies recent fever or chills. Denies sinus pressure, n or sore throat. Denies chest congestion, or cough productive of sputum. Denies chest pain, palpitations, PND, orthopnea or leg swelling. Denies abdominal pain, nausea, vomitting, diarrhea or constipation. Denies change in bowel movements or bloody stool. Denies dysuria , frequency, incontinence or hesitancy.  Denies headaches, vertigo, seizures. Denies uncontrolled  depression, anxiety or insomnia. Denies  rash, lesions, or itch.     Preventive Screening-Counseling & Management  Alcohol-Tobacco     Smoking Status: current     Smoking Cessation Counseling: yes  Current Medications (verified): 1)  Omeprazole 40 Mg Cpdr (Omeprazole) .... Take 1 Capsule By Mouth Once A Day 2)  Metformin Hcl 500 Mg Tabs (Metformin Hcl) .... One Tab By Mouth Bid 3)  Onetouch Test  Strp (Glucose Blood) .... Once Daily Testing 4)  Onetouch Lancets  Misc (Lancets) .... Once Daily Testing 5)  Losartan Potassium 25 Mg Tabs (Losartan Potassium) .... Take 1 Tablet By Mouth Once A Day 6)  Alprazolam 0.5 Mg Tabs (Alprazolam) .... One Taablet At Bedtime As Needed For  Insomnia and Anxiety 7)  Calcium With Vit D 1200mg /1000iu .... One Daily  Allergies (verified): 1)  ! Advil 2)  ! Ibuprofen  Review of Systems      See HPI General:  Complains of fatigue and malaise. Eyes:  Denies discharge, eye pain, itching, and red eye. ENT:  Complains of earache and postnasal drainage; right ear itching. Resp:  Complains of cough and shortness of breath; denies sputum productive and wheezing. GI:  Complains of abdominal pain and indigestion; reports increased and uncontrolled gERD symptoms x 2 weeks. MS:  Complains of joint pain, low back pain, mid back pain, and stiffness. Psych:  Complains of anxiety and depression; denies mental problems, suicidal thoughts/plans, thoughts of violence, and unusual visions or sounds. Endo:  Denies cold intolerance, excessive hunger, excessive thirst, excessive urination, and heat intolerance. Heme:  Denies abnormal bruising, bleeding, and enlarge lymph nodes. Allergy:  Complains of itching eyes, seasonal allergies, and sneezing; increased allergy symptoms x 2 weeks.  Physical Exam  General:  Well-developed,morbibly obese,in no acute distress; alert,appropriate and cooperative throughout examination HEENT: No facial asymmetry,  EOMI, No sinus tenderness, TM's Clear, oropharynx  pink and moist.   Chest: Clear to auscultation bilaterally. decreased air entry bilaterally CVS: S1, S2, No murmurs, No S3.   Abd: Soft, Nontender.  MS: decreased  ROM spine,adequate in  hips, shoulders and knees.  Ext: No edema.   CNS: CN 2-12 intact, power tone and  sensation normal throughout.   Skin: Intact, no visible lesions or rashes.  Psych: Good eye contact, normal affect.  Memory intact, not anxious or depressed appearing.   Diabetes Management Exam:    Foot Exam (with socks and/or shoes not present):       Sensory-Monofilament:          Left foot: diminished          Right foot: diminished       Inspection:          Left foot: abnormal              Comments: callous at heel          Right foot: abnormal             Comments: callous at heel       Nails:          Left foot: thickened          Right foot: thickened   Impression & Recommendations:  Problem # 1:  DIABETES MELLITUS, TYPE II (ICD-250.00) Assessment Comment Only  Her updated medication list for this problem includes:    Metformin Hcl 500 Mg Tabs (Metformin hcl) ..... One tab by mouth bid    Losartan Potassium 25 Mg Tabs (Losartan potassium) .Marland Kitchen... Take 1 tablet by mouth once a day Patient advised to reduce carbs and sweets, commit to regular physical activity, take meds as prescribed, test blood sugars as directed, and attempt to lose weight , to improve blood sugar control.  Labs Reviewed: Creat: 0.64 (04/26/2010)    Reviewed HgBA1c results: 6.1 (04/26/2010)  6.5 (11/24/2009)  Problem # 2:  ALLERGIC RHINITIS (ICD-477.9) Assessment: Deteriorated  Her updated medication list for this problem includes:    Fluticasone Propionate 50 Mcg/act Susp (Fluticasone propionate) ..... One to two puffs per nostril once daily as needed for uncontrolled allergies  Orders: Depo- Medrol 80mg  (J1040) Admin of Therapeutic Inj  intramuscular or subcutaneous (16109)  Problem # 3:  GERD (ICD-530.81) Assessment: Deteriorated  The following medications were removed from the medication list:    Omeprazole 40 Mg Cpdr (Omeprazole) .Marland Kitchen... Take 1 capsule by mouth once a day Her updated medication list for this problem includes:    Omeprazole 20 Mg Cpdr (Omeprazole) .Marland Kitchen... Take 1 capsule by mouth once a day as needed for heartburn  Problem # 4:  NICOTINE ADDICTION (ICD-305.1) Assessment: Deteriorated  The following medications were removed from the medication list:    Nicoderm Cq 21 Mg/24hr Pt24 (Nicotine) .Marland Kitchen... Apply one patch daily for 2 weeks, start 07/12/2010    Nicoderm Cq 14 Mg/24hr Pt24 (Nicotine) .Marland Kitchen... Apply one patch daily for 2 weeks starting on 07/26/2010    Nicoderm  Cq 7 Mg/24hr Pt24 (Nicotine) .Marland Kitchen... Apply one patch daily for 2 weeks , starting on 09/09/2010  Encouraged smoking cessation and discussed different methods for smoking cessation.   Problem # 5:  OBESITY, UNSPECIFIED (ICD-278.00) Assessment: Deteriorated  Ht: 66.5 (09/28/2010)   Wt: 335 (09/28/2010)   BMI: 53.45 (09/28/2010) therapeutic lifestyle change discussed and encouraged  Complete Medication List: 1)  Metformin Hcl 500 Mg Tabs (Metformin hcl) .... One tab by mouth bid 2)  Onetouch Test Strp (Glucose blood) .... Once daily testing 3)  Onetouch Lancets Misc (Lancets) .... Once daily testing 4)  Losartan Potassium 25 Mg Tabs (Losartan potassium) .... Take 1 tablet by mouth once a day 5)  Alprazolam 0.5 Mg Tabs (Alprazolam) .... One taablet at bedtime as needed for insomnia  and anxiety 6)  Calcium With Vit D 1200mg /1000iu  .... One daily 7)  Fluticasone Propionate 50 Mcg/act Susp (Fluticasone propionate) .... One to two puffs per nostril once daily as needed for uncontrolled allergies 8)  Loratidine 10mg   .... Take 1 tablet by mouth once a day 9)  Omeprazole 20 Mg Cpdr (Omeprazole) .... Take 1 capsule by mouth once a day as needed for heartburn  Patient Instructions: 1)  CPE in 3 months. 2)  you will get a 1500 cal dietsheet 3)  you will get a tele # to call for weight reduction surgery. 4)  labs today. 5)  Tobacco is very bad for your health and your loved ones! You Should stop smoking!. 6)  Stop Smoking Tips: Choose a Quit date. Cut down before the Quit date. decide what you will do as a substitute when you feel the urge to smoke(gum,toothpick,exercise). 7)  It is important that you exercise regularly at least 20 minutes 5 times a week. If you develop chest pain, have severe difficulty breathing, or feel very tired , stop exercising immediately and seek medical attention. 8)  You need to lose weight. Consider a lower calorie diet and regular exercise.  9)  meds sent in for  allergies, and you will get an injection in the office for allergies Prescriptions: OMEPRAZOLE 20 MG CPDR (OMEPRAZOLE) Take 1 capsule by mouth once a day as needed for heartburn  #30 x 3   Entered and Authorized by:   Syliva Overman MD   Signed by:   Syliva Overman MD on 09/28/2010   Method used:   Electronically to        [facility] (retail)       [address]       [place], [place]  [address]       Ph: [phone]       Fax: [phone]   RxID:   [id number] LORATIDINE 10MG  Take 1 tablet by mouth once a day  #30 x 3   Entered and Authorized by:   Syliva Overman MD   Signed by:   Syliva Overman MD on 09/28/2010   Method used:   Printed then faxed to ...       [facility]       [address]       [place], [place]  [address]       Ph: [phone]       Fax: [phone]   RxID:   [id number] FLUTICASONE PROPIONATE 50 MCG/ACT SUSP (FLUTICASONE PROPIONATE) one to two puffs per nostril once daily as needed for uncontrolled allergies  #1 x 3   Entered and Authorized by:   Syliva Overman MD   Signed by:   Syliva Overman MD on 09/28/2010   Method used:   Electronically to        [facility] (retail)       [address]       [place], [place]  [address]       Ph: [phone]       Fax: [phone]   RxID:   [id number]    Medication Administration  Injection # 1:    Medication: Depo- Medrol 80mg     Diagnosis: ALLERGIC RHINITIS (ICD-477.9)    Route: IM    Site: RUOQ gluteus  Exp Date: 11/12    Lot #: [id number]    Mfr: [email or web address]    Patient tolerated injection without complications    Given by: Adella Hare LPN (September 27, 1189 9:09 AM)  Orders Added: 1)  Est. Patient Level IV [47829] 2)  Depo- Medrol 80mg  [J1040] 3)  Admin of Therapeutic Inj  intramuscular or subcutaneous [96372]      Medication Administration  Injection # 1:    Medication:  Depo- Medrol 80mg     Diagnosis: ALLERGIC RHINITIS (ICD-477.9)    Route: IM    Site: RUOQ gluteus    Exp Date: 11/12    Lot #: [id number]    Mfr: [email or web address]    Patient tolerated injection without complications    Given by: Adella Hare LPN (September 28, 5619 9:09 AM)  Orders Added: 1)  Est. Patient Level IV [30865] 2)  Depo- Medrol 80mg  [J1040] 3)  Admin of Therapeutic Inj  intramuscular or subcutaneous [78469]

## 2010-10-13 ENCOUNTER — Telehealth: Payer: Self-pay | Admitting: Family Medicine

## 2010-10-13 MED ORDER — PHENTERMINE HCL 37.5 MG PO CAPS: 37.5000 mg | ORAL_CAPSULE | ORAL | Status: DC

## 2010-10-13 NOTE — Telephone Encounter (Signed)
pls advise pt med has been sent

## 2010-10-14 NOTE — Telephone Encounter (Signed)
Called patient, left detailed voicemail

## 2010-10-19 ENCOUNTER — Other Ambulatory Visit: Payer: Self-pay | Admitting: Family Medicine

## 2010-11-25 ENCOUNTER — Encounter: Payer: Self-pay | Admitting: Family Medicine

## 2010-11-29 ENCOUNTER — Other Ambulatory Visit (HOSPITAL_COMMUNITY)
Admission: RE | Admit: 2010-11-29 | Discharge: 2010-11-29 | Disposition: A | Discharge status: Home / Self Care | Payer: Managed Care, Other (non HMO) | Source: Ambulatory Visit | Attending: Family Medicine | Admitting: Family Medicine

## 2010-11-29 ENCOUNTER — Encounter: Payer: Self-pay | Admitting: Family Medicine

## 2010-11-29 ENCOUNTER — Ambulatory Visit (INDEPENDENT_AMBULATORY_CARE_PROVIDER_SITE_OTHER): Payer: Managed Care, Other (non HMO) | Admitting: Family Medicine

## 2010-11-29 DIAGNOSIS — Z124 Encounter for screening for malignant neoplasm of cervix: Secondary | ICD-10-CM

## 2010-11-29 DIAGNOSIS — I10 Essential (primary) hypertension: Secondary | ICD-10-CM

## 2010-11-29 DIAGNOSIS — F17200 Nicotine dependence, unspecified, uncomplicated: Secondary | ICD-10-CM

## 2010-11-29 DIAGNOSIS — J302 Other seasonal allergic rhinitis: Secondary | ICD-10-CM

## 2010-11-29 DIAGNOSIS — Z01419 Encounter for gynecological examination (general) (routine) without abnormal findings: Secondary | ICD-10-CM | POA: Insufficient documentation

## 2010-11-29 DIAGNOSIS — Z1211 Encounter for screening for malignant neoplasm of colon: Secondary | ICD-10-CM

## 2010-11-29 DIAGNOSIS — J309 Allergic rhinitis, unspecified: Secondary | ICD-10-CM

## 2010-11-29 DIAGNOSIS — Z23 Encounter for immunization: Secondary | ICD-10-CM

## 2010-11-29 DIAGNOSIS — E119 Type 2 diabetes mellitus without complications: Secondary | ICD-10-CM

## 2010-11-29 MED ORDER — PHENTERMINE HCL 37.5 MG PO CAPS: 37.5000 mg | ORAL_CAPSULE | ORAL | Status: DC

## 2010-11-29 MED ORDER — ALPRAZOLAM 0.5 MG PO TABS: 0.5000 mg | ORAL_TABLET | Freq: Every evening | ORAL | Status: DC | PRN

## 2010-11-29 MED ORDER — METHYLPREDNISOLONE ACETATE 80 MG/ML IJ SUSP
80.0000 mg | Freq: Once | INTRAMUSCULAR | Status: AC
  Administered 2010-11-29: 80 mg via INTRAMUSCULAR

## 2010-11-29 MED ORDER — OMEPRAZOLE 40 MG PO CPDR: 40.0000 mg | DELAYED_RELEASE_CAPSULE | Freq: Every day | ORAL | Status: DC

## 2010-11-29 NOTE — Patient Instructions (Addendum)
F/U in 3 months.  Cut cigarretes by 1 every month   Restrict calories to between 1200 to 1500 calories per day.  It is important that you exercise regularly at least 30 minutes 7 times a week. If you develop chest pain, have severe difficulty breathing, or feel very tired, stop exercising immediately and seek medical attention  Ensure you get at least 7 hours of sleep every night.  Congrats on 6 pound weight loss, goal is 4 pounds each month at least.  Pls resched your mammogram    You need a pneumonia vaccine for your protection, pls reconsider this.  You will get depomedrol injection today for allergies   Please think about quitting smoking.  This is very important for your health.  Consider setting a quit date, then cutting back or switching brands to prepare to stop.  Also think of the money you will save every day by not smoking.  Quick Tips to Quit Smoking: Fix a date i.e. keep a date in mind from when you would not touch a tobacco product to smoke  Keep yourself busy and block your mind with work loads or reading books or watching movies in malls where smoking is not allowed  Vanish off the things which reminds you about smoking for example match box, or your favorite lighter, or the pipe you used for smoking, or your favorite jeans and shirt with which you used to enjoy smoking, or the club where you used to do smoking  Try to avoid certain people places and incidences where and with whom smoking is a common factor to add on  Praise yourself with some token gifts from the money you saved by stopping smoking  Anti Smoking teams are there to help you. Join their programs  Anti-smoking Gums are there in many medical shops. Try them to quit smoking   Side-effects of Smoking: Disease caused by smoking cigarettes are emphysema, bronchitis, heart failures  Premature death  Cancer is the major side effect of smoking  Heart attacks and strokes are the quick effects of smoking  causing sudden death  Some smokers lives end up with limbs amputated  Breathing problem or fast breathing is another side effect of smoking  Due to more intakes of smokes, carbon mono-oxide goes into your brain and other muscles of the body which leads to swelling of the veins and blockage to the air passage to lungs  Carbon monoxide blocks blood vessels which leads to blockage in the flow of blood to different major body organs like heart lungs and thus leads to attacks and deaths  During pregnancy smoking is very harmful and leads to premature birth of the infant, spontaneous abortions, low weight of the infant during birth  Fat depositions to narrow and blocked blood vessels causing heart attacks  In many cases cigarette smoking caused infertility in men

## 2010-11-30 LAB — MICROALBUMIN / CREATININE URINE RATIO
Creatinine, Urine: 80.3 mg/dL
Microalb, Ur: 0.5 mg/dL (ref 0.00–1.89)

## 2010-11-30 NOTE — Progress Notes (Signed)
  Subjective:    Patient ID: [name], female    DOB: 1965-11-02, 45 y.o.   MRN: [id number]  HPI HYPERTENSION Disease Monitoring Blood pressure range-unknown Chest pain- no      Dyspnea- no Medications Compliance- good Lightheadedness- no   Edema- no   DIABETES Disease Monitoring Blood Sugar ranges-fasting between 90 to 110 Polyuria- no New Visual problems- wears corrective lenses Medications Compliance- good Hypoglycemic symptoms- no Eye exam : within the  past 2 weeks, reports no diabetic retinopathy  Refusing pneumovaccine C/o increased and uncontrolled allergy symptoms, nasal congestion and drainage, clear, excessive sneezing, states that claritin is not helping, does not want nasal spray, requests depomedrol. Adverse s/e of steroids was discussed, insiits that this helps a lot for months   Review of Systems See HPI. Denies recent fever or chills. Denies sinus pressure,  ear pain or sore throat. Denies chest congestion, productive cough or wheezing. Denies chest pains, palpitations, paroxysmal nocturnal dyspnea, orthopnea and leg swelling Denies abdominal pain, nausea, vomiting,diarrhea or constipation.  Denies rectal bleeding or change in bowel movement. Denies dysuria, frequency, hesitancy or incontinence. Denies joint pain, swelling and limitation in mobility. Denies headaches, seizure, numbness, or tingling. Denies depression, anxiety or insomnia. Denies skin break down or rash.        Objective:   Physical Exam Pleasant well nourished female, alert and oriented x 3, in no cardio-pulmonary distress. Afebrile. HEENT No facial trauma or asymetry.   EOMI, PERTL, fundoscopic exam is normal, no hemorhage or exudate. No papiledema External ears normal, tympanic membranes clear. Oropharynx moist, no exudate, good dentition. Neck: supple, no adenopathy,JVD or thyromegaly.No bruits.  Chest: Clear to ascultation bilaterally.No crackles or wheezes. Non tender to  palpation  Breast: No asymetry,no masses. No nipple discharge or inversion. No axillary or supraclavicular adenopathy  Cardiovascular system; Heart sounds normal,  S1 and  S2 ,no S3.  No murmur, or thrill. Apical beat not displaced Peripheral pulses normal.  Abdomen: Soft, non tender, no organomegaly or masses. No bruits. Bowel sounds normal. No guarding, tenderness or rebound.  Rectal:  No mass. guaic negative stool.  GU: External genitalia normal. No lesions. Vaginal canal normal.No discharge. Uterus normal size, no adnexal masses, no cervical motion or adnexal tenderness.  Musculoskeletal exam: Full ROM of spine, hips , shoulders and knees. No deformity ,swelling or crepitus noted. No muscle wasting or atrophy.   Neurologic: Cranial nerves 2 to 12 intact. Power, tone ,sensation and reflexes normal throughout. No disturbance in gait. No tremor.  Skin: Intact, no ulceration, erythema , scaling or rash noted. Pigmentation normal throughout  Psych; Normal mood and affect. Judgement and concentration normal Diabetic Foot Check:  Appearance - no lesions or ulcers,calluses on heels Skin - no unusual pallor or redness Sensation - grossly intact to light touch Monofilament testing -  Right - Great toe, medial, central, lateral ball intact, diminished at heel Left - Great toe, medial, central, lateral ball intact, diminished at heel Pulses Left - Dorsalis Pedis and Posterior Tibia normal Right - Dorsalis Pedis and Posterior Tibia normal        Assessment & Plan:

## 2010-11-30 NOTE — Assessment & Plan Note (Signed)
Unchanged however pt has the strong desire to quit

## 2010-11-30 NOTE — Assessment & Plan Note (Signed)
Controlled, however pt encouraged to be even more aggressive with lifestyle changeto lose weight and improve even more

## 2010-11-30 NOTE — Assessment & Plan Note (Signed)
Deteriorated, depomedrol administered 

## 2010-11-30 NOTE — Assessment & Plan Note (Signed)
Controlled, no change in medication  

## 2010-12-01 ENCOUNTER — Encounter: Payer: Self-pay | Admitting: *Deleted

## 2010-12-10 NOTE — Procedures (Signed)
HISTORY:  This patient is a 46 year old who is being evaluated for  depression, possible seizure events.   This is a routine EEG. No skull defects are noted. Medications are not  listed.   EEG CLASSIFICATION:  Normal awake.   DESCRIPTION OF RECORDING:  The background rhythm of this recording consists  of fairly well modulated medium amplitude alpha rhythm of 9 Hz that is  reactive to eye opening and closure. As the record progresses, the patient  appears to remain in the waking state during the recording. Photic  stimulation and hyperventilation were not performed. At no time during the  recording does there appear to be evidence of actual spikes or spike wave  discharges, or evidence of focal slowing. EKG monitor shows no evidence of  cardiac rhythm abnormalities with a heart rate of 72.   IMPRESSION:  This is a normal EEG recording in the waking state. No evidence  of ictal or interictal discharges are seen.      ZOX:WRUE  D:  10/20/2004 15:38:06  T:  10/20/2004 16:24:09  Job #:  [id number]

## 2010-12-10 NOTE — Procedures (Signed)
EVOKED POTENTIAL STUDY   PROCEDURE NUMBER:  [id number]   CLINICAL HISTORY:  The patient is being studied to look for disease of the  anterior visual pathways.   PROCEDURE:  The tracing is carried out using a 12 x 16 check grid  oscillating at 1.9 times per second.  Filters range between 1 and 100 Hz.  A  250 millisecond period was displayed following the stimulus artifact with  all stimuli averaged in duplicate to arrive at the final response.  The  patient was very drowsy during the recording and did not maintain good  fixation.  All latencies were expressed in milliseconds.   Stimulation of the left eye produced defined waveforms with fair interline  correlation.  Latencies were as follows:  N1: 66.44, P1: 102.58, N2: 125.06.  Similarly, stimulation of the right eye produced waveforms with fair  interline correlation.  Latencies were as follows:  N1: 79.14, P1: 105.52,  N2: 119.19.   IMPRESSION:  These pattern reversal visual evoked responses are within  normal limits though they are of limited utility.  It would be my  recommendation for this to be repeated at a time when the patient can be  more alert and cooperative.      ZOX:WRUE  D:  05/04/2004 12:06:00  T:  05/04/2004 14:45:37  Job #:  [id number]   cc:   Kofi A. Gerilyn Pilgrim, M.D.  [address], [place]  [place]  [place] [address]  Fax: [phone]

## 2010-12-10 NOTE — Op Note (Signed)
   NAME:  [name], [name]                        ACCOUNT NO.:  [id number]   MEDICAL RECORD NO.:  [id number]                   PATIENT TYPE:  AMB   LOCATION:  [facility]                                 FACILITY:  [facility]   PHYSICIAN:  Daniel L. Eda Paschal, M.D.           DATE OF BIRTH:  October 15, 1965   DATE OF PROCEDURE:  11/07/2002  DATE OF DISCHARGE:                                 OPERATIVE REPORT   PREOPERATIVE DIAGNOSES:  Left Bartholin cyst.   POSTOPERATIVE DIAGNOSES:  Left Bartholin cyst.   OPERATION:  Marsupialization of left Bartholin cyst.   SURGEON:  Daniel L. Eda Paschal, M.D.   ANESTHESIA:  General.   INDICATIONS FOR PROCEDURE:  The patient is a 45 year old female who  presented to the office with a 4 cm swelling of her left Bartholin's gland.  It has caused a lot of discomfort and she now enters the hospital for  marsupialization of the above.   FINDINGS:  Abnormal pelvic findings were limited to her left Bartholin's  area where she had a 4 cm Bartholin cyst. When it was opened, it drained old  bloody material.   DESCRIPTION OF PROCEDURE:  After adequate general endotracheal anesthesia,  the patient was placed in the dorsal lithotomy position, prepped and draped  in the usual sterile manner. The left Bartholin cyst was outlined, it was  opened with a scalpel, brownish material drained from it which was cultured.  The cyst wall could be identified 360 degrees around and then it was sutured  so that the cyst wall sutured to the mucocutaneous junction with a running  locking 3-0 Vicryl suture. Copious irrigation was done with Ringer's  lactate, there was no bleeding at the termination of the procedure. The  patient tolerated the procedure well. Estimated blood loss for the entire  procedure was less than 20 mL with none replaced. The patient left the  operating room in satisfactory condition.                                               Daniel L. Eda Paschal, M.D.    Tonette Bihari  D:  11/07/2002  T:  11/07/2002  Job:  [id number]
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
## 2011-01-03 ENCOUNTER — Other Ambulatory Visit: Payer: Self-pay | Admitting: Family Medicine

## 2011-01-10 ENCOUNTER — Telehealth: Payer: Self-pay | Admitting: Family Medicine

## 2011-01-10 NOTE — Telephone Encounter (Signed)
Advised zyrtec or claritin otc, may also try benadryl at night

## 2011-01-10 NOTE — Telephone Encounter (Signed)
Called patient, no answer 

## 2011-01-12 ENCOUNTER — Other Ambulatory Visit: Payer: Self-pay | Admitting: Family Medicine

## 2011-01-14 ENCOUNTER — Telehealth: Payer: Self-pay | Admitting: Family Medicine

## 2011-01-14 MED ORDER — LOSARTAN POTASSIUM 25 MG PO TABS: 25.0000 mg | ORAL_TABLET | Freq: Every day | ORAL | Status: DC

## 2011-01-14 NOTE — Telephone Encounter (Signed)
Med sent as requested 

## 2011-03-01 ENCOUNTER — Other Ambulatory Visit: Payer: Self-pay | Admitting: Family Medicine

## 2011-03-07 ENCOUNTER — Encounter: Payer: Self-pay | Admitting: Family Medicine

## 2011-03-13 ENCOUNTER — Other Ambulatory Visit: Payer: Self-pay | Admitting: Family Medicine

## 2011-03-21 ENCOUNTER — Ambulatory Visit: Payer: Managed Care, Other (non HMO) | Admitting: Family Medicine

## 2011-03-29 ENCOUNTER — Telehealth: Payer: Self-pay | Admitting: Family Medicine

## 2011-03-29 NOTE — Telephone Encounter (Signed)
Printed to be mailed

## 2011-03-30 ENCOUNTER — Telehealth: Payer: Self-pay | Admitting: Family Medicine

## 2011-03-30 NOTE — Telephone Encounter (Signed)
What kind of meter?  Called patient, left message

## 2011-03-31 MED ORDER — GLUCOSE BLOOD VI STRP: ORAL_STRIP | Status: DC

## 2011-03-31 NOTE — Telephone Encounter (Signed)
Refill sent as requested. 

## 2011-04-01 ENCOUNTER — Telehealth: Payer: Self-pay | Admitting: Family Medicine

## 2011-04-01 ENCOUNTER — Encounter: Payer: Self-pay | Admitting: Family Medicine

## 2011-04-01 MED ORDER — GLUCOSE BLOOD VI STRP: ORAL_STRIP | Status: DC

## 2011-04-01 NOTE — Telephone Encounter (Signed)
Med resent to correct pharmacy  

## 2011-04-04 ENCOUNTER — Ambulatory Visit (INDEPENDENT_AMBULATORY_CARE_PROVIDER_SITE_OTHER): Payer: Managed Care, Other (non HMO) | Admitting: Family Medicine

## 2011-04-04 ENCOUNTER — Encounter: Payer: Self-pay | Admitting: Family Medicine

## 2011-04-04 VITALS — BP 122/80 | HR 74 | Resp 16 | Ht 66.5 in | Wt 329.0 lb

## 2011-04-04 DIAGNOSIS — F17200 Nicotine dependence, unspecified, uncomplicated: Secondary | ICD-10-CM

## 2011-04-04 DIAGNOSIS — I10 Essential (primary) hypertension: Secondary | ICD-10-CM

## 2011-04-04 DIAGNOSIS — E669 Obesity, unspecified: Secondary | ICD-10-CM

## 2011-04-04 DIAGNOSIS — R5383 Other fatigue: Secondary | ICD-10-CM

## 2011-04-04 DIAGNOSIS — F419 Anxiety disorder, unspecified: Secondary | ICD-10-CM

## 2011-04-04 DIAGNOSIS — Z79899 Other long term (current) drug therapy: Secondary | ICD-10-CM

## 2011-04-04 DIAGNOSIS — J309 Allergic rhinitis, unspecified: Secondary | ICD-10-CM

## 2011-04-04 DIAGNOSIS — K219 Gastro-esophageal reflux disease without esophagitis: Secondary | ICD-10-CM

## 2011-04-04 DIAGNOSIS — E119 Type 2 diabetes mellitus without complications: Secondary | ICD-10-CM

## 2011-04-04 DIAGNOSIS — F411 Generalized anxiety disorder: Secondary | ICD-10-CM

## 2011-04-04 DIAGNOSIS — J302 Other seasonal allergic rhinitis: Secondary | ICD-10-CM

## 2011-04-04 DIAGNOSIS — R5381 Other malaise: Secondary | ICD-10-CM

## 2011-04-04 LAB — LIPID PANEL
LDL Cholesterol: 98 mg/dL (ref 0–99)
Triglycerides: 114 mg/dL (ref <150)

## 2011-04-04 MED ORDER — PHENTERMINE HCL 37.5 MG PO CAPS: 37.5000 mg | ORAL_CAPSULE | ORAL | Status: DC

## 2011-04-04 MED ORDER — METHYLPREDNISOLONE ACETATE 80 MG/ML IJ SUSP
80.0000 mg | Freq: Once | INTRAMUSCULAR | Status: AC
  Administered 2011-04-04: 80 mg via INTRAMUSCULAR

## 2011-04-04 NOTE — Progress Notes (Signed)
  Subjective:    Patient ID: [name], female    DOB: Oct 29, 1965, 45 y.o.   MRN: [id number]  HPI The PT is here for follow up and re-evaluation of chronic medical conditions, medication management and review of any available recent lab and radiology data.  Preventive health is updated, specifically  Cancer screening and Immunization.   Questions or concerns regarding consultations or procedures which the PT has had in the interim are  addressed. The PT denies any adverse reactions to current medications since the last visit.  Tests blood sugars and fasting sugars are seldom over 120. Has cut back to approx 4 ciggs per day, wants to quit. Concerned about weight, wants appetite suppressant, also plans to start regular exercise at the end of the day . Has been stressed over the past 2 months, her son age 65 is incarcerated x 3 years. 2 weekl h/o increased allergy symptoms, nasal congestion, drainage which is clear,excessive sneezing and frontal headache, flonase helped , but wants a depo medrol injection also      Review of Systems See HPI Denies recent fever or chills. Denies sinus pressure, nasal congestion, ear pain or sore throat. Denies chest congestion, productive cough or wheezing. Denies chest pains, palpitations and leg swelling Denies abdominal pain, nausea, vomiting,diarrhea or constipation.   Denies dysuria, frequency, hesitancy or incontinence. Denies joint pain, swelling and limitation in mobility. Denies headaches, seizures, numbness, or tingling. Denies depression, anxiety or insomnia. Denies skin break down or rash.        Objective:   Physical Exam Patient alert and oriented and in no cardiopulmonary distress.  HEENT: No facial asymmetry, EOMI, mild maxillary  sinus tenderness,  oropharynx pink and moist.  Neck supple no adenopathy. Nasal mucosa erythematous and edematous  Chest: Clear to auscultation bilaterally.  CVS: S1, S2 no murmurs, no  S3.  ABD: Soft non tender. Bowel sounds normal.  Ext: No edema  MS: Adequate ROM spine, shoulders, hips and knees.  Skin: Intact, no ulcerations or rash noted.  Psych: Good eye contact, normal affect. Memory intact not anxious or depressed appearing. Diabetic Foot Check:  Appearance - calluses Skin - tinea pedis and onychomycosis Sensation - grossly intact to light touch Monofilament testing -  Right - Great toe, medial, central, lateral ball and posterior foot diminished at heel Left - Great toe, medial, central, lateral ball and posterior foot diminished at heel Pulses Left - Dorsalis Pedis and Posterior Tibia normal Right - Dorsalis Pedis and Posterior Tibia normal  CNS: CN 2-12 intact, power, tone and sensation normal throughout.        Assessment & Plan:

## 2011-04-04 NOTE — Assessment & Plan Note (Signed)
Controlled, no change in medication  

## 2011-04-04 NOTE — Assessment & Plan Note (Signed)
Improved, though still smoking trying to quit

## 2011-04-04 NOTE — Patient Instructions (Addendum)
F/U in 2 months.  New med for weight loss.  Goal of weight loss is 8 pounds  Exercise for 3 minutes every day  Nothing to eat after 8pm   Three scheduled meals every day, and 2 healthy snacks if hungry between   Please think about quitting smoking.  This is very important for your health.  Consider setting a quit date, then cutting back or switching brands to prepare to stop.  Also think of the money you will save every day by not smoking.  Quick Tips to Quit Smoking: Fix a date i.e. keep a date in mind from when you would not touch a tobacco product to smoke  Keep yourself busy and block your mind with work loads or reading books or watching movies in malls where smoking is not allowed  Vanish off the things which reminds you about smoking for example match box, or your favorite lighter, or the pipe you used for smoking, or your favorite jeans and shirt with which you used to enjoy smoking, or the club where you used to do smoking  Try to avoid certain people places and incidences where and with whom smoking is a common factor to add on  Praise yourself with some token gifts from the money you saved by stopping smoking  Anti Smoking teams are there to help you. Join their programs  Anti-smoking Gums are there in many medical shops. Try them to quit smoking   Side-effects of Smoking: Disease caused by smoking cigarettes are emphysema, bronchitis, heart failures  Premature death  Cancer is the major side effect of smoking  Heart attacks and strokes are the quick effects of smoking causing sudden death  Some smokers lives end up with limbs amputated  Breathing problem or fast breathing is another side effect of smoking  Due to more intakes of smokes, carbon mono-oxide goes into your brain and other muscles of the body which leads to swelling of the veins and blockage to the air passage to lungs  Carbon monoxide blocks blood vessels which leads to blockage in the flow of blood to  different major body organs like heart lungs and thus leads to attacks and deaths  During pregnancy smoking is very harmful and leads to premature birth of the infant, spontaneous abortions, low weight of the infant during birth  Fat depositions to narrow and blocked blood vessels causing heart attacks  In many cases cigarette smoking caused infertility in men

## 2011-04-04 NOTE — Assessment & Plan Note (Signed)
Deteriorated. Patient re-educated about  the importance of commitment to a  minimum of 150 minutes of exercise per week. The importance of healthy food choices with portion control discussed. Encouraged to start a food diary, count calories and to consider  joining a support group. Sample diet sheets offered. Goals set by the patient for the next several months.    

## 2011-04-05 LAB — COMPLETE METABOLIC PANEL WITH GFR
ALT: 9 U/L (ref 0–35)
BUN: 10 mg/dL (ref 6–23)
CO2: 28 mEq/L (ref 19–32)
Calcium: 9.6 mg/dL (ref 8.4–10.5)
Chloride: 103 mEq/L (ref 96–112)
Creat: 0.72 mg/dL (ref 0.50–1.10)
GFR, Est African American: >60 mL/min (ref >60)

## 2011-04-06 ENCOUNTER — Telehealth: Payer: Self-pay | Admitting: *Deleted

## 2011-04-06 NOTE — Telephone Encounter (Signed)
Patient aware.

## 2011-04-06 NOTE — Telephone Encounter (Signed)
Message copied by Diamantina Monks on Wed Apr 06, 2011  9:29 AM ------      Message from: Syliva Overman MD E      Created: Tue Apr 05, 2011  4:05 AM       Advise blood sugars are higher, needs to exercise , cut back carbs and lose weight , otherwise labs are good

## 2011-04-13 ENCOUNTER — Other Ambulatory Visit: Payer: Self-pay | Admitting: Family Medicine

## 2011-06-01 ENCOUNTER — Encounter: Payer: Self-pay | Admitting: Family Medicine

## 2011-06-06 ENCOUNTER — Ambulatory Visit: Payer: Managed Care, Other (non HMO) | Admitting: Family Medicine

## 2011-06-07 ENCOUNTER — Telehealth: Payer: Self-pay | Admitting: Family Medicine

## 2011-06-08 NOTE — Telephone Encounter (Signed)
Left message

## 2011-06-08 NOTE — Telephone Encounter (Signed)
Put in first available. We have none for today.

## 2011-06-10 NOTE — Telephone Encounter (Signed)
Appointment 12.3.12 @9 :45

## 2011-06-18 ENCOUNTER — Other Ambulatory Visit: Payer: Self-pay | Admitting: Family Medicine

## 2011-06-21 ENCOUNTER — Telehealth: Payer: Self-pay | Admitting: Family Medicine

## 2011-06-21 MED ORDER — LOSARTAN POTASSIUM 25 MG PO TABS: ORAL_TABLET | ORAL | Status: DC

## 2011-06-21 NOTE — Telephone Encounter (Signed)
Sent in

## 2011-06-22 ENCOUNTER — Encounter: Payer: Self-pay | Admitting: Family Medicine

## 2011-06-27 ENCOUNTER — Encounter: Payer: Self-pay | Admitting: Family Medicine

## 2011-06-27 ENCOUNTER — Ambulatory Visit (INDEPENDENT_AMBULATORY_CARE_PROVIDER_SITE_OTHER): Payer: Managed Care, Other (non HMO) | Admitting: Family Medicine

## 2011-06-27 VITALS — BP 128/70 | HR 86 | Resp 18 | Ht 66.5 in | Wt 329.1 lb

## 2011-06-27 DIAGNOSIS — J209 Acute bronchitis, unspecified: Secondary | ICD-10-CM

## 2011-06-27 DIAGNOSIS — J329 Chronic sinusitis, unspecified: Secondary | ICD-10-CM

## 2011-06-27 DIAGNOSIS — N951 Menopausal and female climacteric states: Secondary | ICD-10-CM

## 2011-06-27 DIAGNOSIS — E119 Type 2 diabetes mellitus without complications: Secondary | ICD-10-CM

## 2011-06-27 DIAGNOSIS — I10 Essential (primary) hypertension: Secondary | ICD-10-CM

## 2011-06-27 DIAGNOSIS — T7840XA Allergy, unspecified, initial encounter: Secondary | ICD-10-CM

## 2011-06-27 DIAGNOSIS — E785 Hyperlipidemia, unspecified: Secondary | ICD-10-CM

## 2011-06-27 DIAGNOSIS — F17200 Nicotine dependence, unspecified, uncomplicated: Secondary | ICD-10-CM

## 2011-06-27 DIAGNOSIS — J0100 Acute maxillary sinusitis, unspecified: Secondary | ICD-10-CM

## 2011-06-27 DIAGNOSIS — R232 Flushing: Secondary | ICD-10-CM | POA: Insufficient documentation

## 2011-06-27 LAB — HEMOGLOBIN A1C
Hgb A1c MFr Bld: 6.4 % — ABNORMAL HIGH (ref <5.7)
Mean Plasma Glucose: 137 mg/dL — ABNORMAL HIGH (ref <117)

## 2011-06-27 MED ORDER — FLUTICASONE PROPIONATE 50 MCG/ACT NA SUSP: 2.0000 | Freq: Every day | NASAL | Status: DC

## 2011-06-27 MED ORDER — SULFAMETHOXAZOLE-TRIMETHOPRIM 800-160 MG PO TABS: 1.0000 | ORAL_TABLET | Freq: Two times a day (BID) | ORAL | Status: AC

## 2011-06-27 MED ORDER — FLUOXETINE HCL 10 MG PO CAPS: 10.0000 mg | ORAL_CAPSULE | Freq: Every day | ORAL | Status: DC

## 2011-06-27 MED ORDER — METHYLPREDNISOLONE ACETATE 80 MG/ML IJ SUSP
80.0000 mg | Freq: Once | INTRAMUSCULAR | Status: AC
  Administered 2011-06-27: 80 mg via INTRAMUSCULAR

## 2011-06-27 MED ORDER — FLUCONAZOLE 100 MG PO TABS: ORAL_TABLET | ORAL | Status: AC

## 2011-06-27 NOTE — Progress Notes (Signed)
  Subjective:    Patient ID: [name], female    DOB: 04-Feb-1966, 45 y.o.   MRN: [id number]  HPI 1 week h/o increased head and chest congestion, yellow nasal drainage and increased cough. Has had chills, no documented fever. Still smokes, unwilling to set a quit date. Reports increased and uncontrolled allergy symptoms for the past month. Frustrated with inability to lose weight, no discipline as yet as far as diet and exercise are concerned, interested in surgical intervention. Reports uncontrolled hot flashes and mood swings, requests medication for this, over eating to compensate also. Blood sugars fasting remain under 120 , when tested. Nicotine use is unchanged, with no commitment to quitting at this time   Review of Systems See HPI  Denies chest pains, palpitations and leg swelling Denies abdominal pain, nausea, vomiting,diarrhea or constipation.   Denies dysuria, frequency, hesitancy or incontinence. Denies joint pain, swelling and limitation in mobility. Denies headaches, seizures, numbness, or tingling.  Denies skin break down or rash.        Objective:   Physical Exam  Patient alert and oriented and in no cardiopulmonary distress.  HEENT: No facial asymmetry, EOMI, maxillary  sinus tenderness,  oropharynx pink and moist.  Neck supple no adenopathy.Erythema and edema of nasal mucosa  Chest: decreased air entry, scattered crackles, no wheezes  CVS: S1, S2 no murmurs, no S3.  ABD: Soft non tender. Bowel sounds normal.  Ext: No edema  MS: Adequate ROM spine, shoulders, hips and knees.  Skin: Intact, no ulcerations or rash noted.  Psych: Good eye contact, normal affect. Memory intact not anxious or depressed appearing.  CNS: CN 2-12 intact, power, tone and sensation normal throughout.       Assessment & Plan:

## 2011-06-27 NOTE — Assessment & Plan Note (Signed)
Controlled, no change in medication  

## 2011-06-27 NOTE — Assessment & Plan Note (Signed)
Antibiotic prescribed 

## 2011-06-27 NOTE — Patient Instructions (Addendum)
F/u end March.  You are being treated for acute bronchitis, uncontrolled allergies and left maxillary sinusitis. Depo medrol is administred in the office, and medications are also sent in.  Stop sweetened drinks, Kool aid and tea, only water and crystal light or diet. Plan to snack and have snacks of fruit or vegetable fresh available.  We will provide tele contact info for you to register for class on gastric surgery for weight loss.   You need to cut back on cigarettes with a plan to quit.  New med for hot flashes  And anxiety.  Start daily exercise please.  HBA1C today.  Fasting lipid, cmp and EGFR, HBA1C and TSH in March before follow up

## 2011-06-27 NOTE — Assessment & Plan Note (Signed)
Unchanged, counseled to quit 

## 2011-06-27 NOTE — Assessment & Plan Note (Addendum)
Worsening with mood instability, fluoxetine to be started

## 2011-06-27 NOTE — Assessment & Plan Note (Signed)
Increased symptoms, depo medrol administered

## 2011-06-28 NOTE — Assessment & Plan Note (Signed)
Antibiotic prescribed for acute infection

## 2011-07-22 ENCOUNTER — Telehealth: Payer: Self-pay

## 2011-07-22 NOTE — Telephone Encounter (Signed)
Patient called in and stated that she had been having a cramping in her chest all night under her left breast. She thought it was gas but has been taking gas x with no relief. States the pain is constant and not affected by breathing or movement. Advised UC or ED for eval

## 2011-08-17 ENCOUNTER — Other Ambulatory Visit: Payer: Self-pay | Admitting: Family Medicine

## 2011-09-12 ENCOUNTER — Other Ambulatory Visit: Payer: Self-pay | Admitting: Family Medicine

## 2011-09-16 ENCOUNTER — Ambulatory Visit: Payer: Managed Care, Other (non HMO) | Admitting: Family Medicine

## 2011-09-19 ENCOUNTER — Ambulatory Visit: Payer: Managed Care, Other (non HMO) | Admitting: Family Medicine

## 2011-09-26 ENCOUNTER — Ambulatory Visit: Payer: Managed Care, Other (non HMO) | Admitting: Family Medicine

## 2011-09-30 ENCOUNTER — Ambulatory Visit: Payer: Managed Care, Other (non HMO) | Admitting: Family Medicine

## 2011-10-28 ENCOUNTER — Telehealth: Payer: Self-pay | Admitting: Family Medicine

## 2011-10-29 ENCOUNTER — Other Ambulatory Visit: Payer: Self-pay | Admitting: Family Medicine

## 2011-10-31 ENCOUNTER — Encounter: Payer: Self-pay | Admitting: Family Medicine

## 2011-10-31 ENCOUNTER — Ambulatory Visit (INDEPENDENT_AMBULATORY_CARE_PROVIDER_SITE_OTHER): Payer: Managed Care, Other (non HMO) | Admitting: Family Medicine

## 2011-10-31 VITALS — BP 114/74 | HR 72 | Resp 15 | Ht 66.5 in | Wt 331.4 lb

## 2011-10-31 DIAGNOSIS — F17200 Nicotine dependence, unspecified, uncomplicated: Secondary | ICD-10-CM

## 2011-10-31 DIAGNOSIS — N951 Menopausal and female climacteric states: Secondary | ICD-10-CM

## 2011-10-31 DIAGNOSIS — R232 Flushing: Secondary | ICD-10-CM

## 2011-10-31 DIAGNOSIS — E119 Type 2 diabetes mellitus without complications: Secondary | ICD-10-CM

## 2011-10-31 DIAGNOSIS — E669 Obesity, unspecified: Secondary | ICD-10-CM

## 2011-10-31 DIAGNOSIS — I10 Essential (primary) hypertension: Secondary | ICD-10-CM

## 2011-10-31 DIAGNOSIS — J329 Chronic sinusitis, unspecified: Secondary | ICD-10-CM

## 2011-10-31 MED ORDER — VENLAFAXINE HCL ER 37.5 MG PO CP24: 37.5000 mg | ORAL_CAPSULE | Freq: Every day | ORAL | Status: DC

## 2011-10-31 MED ORDER — METHYLPREDNISOLONE ACETATE 80 MG/ML IJ SUSP
80.0000 mg | Freq: Once | INTRAMUSCULAR | Status: AC
  Administered 2011-10-31: 80 mg via INTRAMUSCULAR

## 2011-10-31 MED ORDER — PHENTERMINE HCL 37.5 MG PO CAPS: 37.5000 mg | ORAL_CAPSULE | ORAL | Status: DC

## 2011-10-31 NOTE — Assessment & Plan Note (Signed)
Controlled, no change in medication  

## 2011-10-31 NOTE — Patient Instructions (Addendum)
cpe in 3 month  Depo medrol in office today for allergy symptoms.You also need to start using flonase daily  You need to cut back on cigarettes you need to quit.  Stop fluoxetine, and start effexor for hot flashes and depression and anxiety.  You will start phentermine one daily, you need to restrict calories to 1600 per 24 hours.  Please commit to exercise at least 30 minutes 5 days per week Weight loss goal of 4 pounds per month  HBA1C in 3 months, BEFORE visit

## 2011-10-31 NOTE — Telephone Encounter (Signed)
Unable to send lab before visit.

## 2011-10-31 NOTE — Assessment & Plan Note (Signed)
Good response to prozac but will change to effexor since she is starting phentermine

## 2011-10-31 NOTE — Assessment & Plan Note (Signed)
Deteriorated. Patient re-educated about  the importance of commitment to a  minimum of 150 minutes of exercise per week. The importance of healthy food choices with portion control discussed. Encouraged to start a food diary, count calories and to consider  joining a support group. Sample diet sheets offered. Goals set by the patient for the next several months.    

## 2011-10-31 NOTE — Assessment & Plan Note (Signed)
Low carb diet with weight loss and exercise and medication compliance stressed updated lab past due.

## 2011-10-31 NOTE — Assessment & Plan Note (Signed)
Cessation counseling done 

## 2011-10-31 NOTE — Progress Notes (Signed)
  Subjective:    Patient ID: [name], female    DOB: [birth year], 46 y.o.   MRN: [id number]  HPI The PT is here for follow up and re-evaluation of chronic medical conditions, medication management and review of any available recent lab and radiology data.  Preventive health is updated, specifically  Cancer screening and Immunization.   Questions or concerns regarding consultations or procedures which the PT has had in the interim are  addressed. The PT denies any adverse reactions to current medications since the last visit.  C/o increased and uncontrolled allergy symptoms in the past 2 weeks, with headache requests steroid injection. Concerned about weight , has started some exercise and wants phentermine. Reports good response top prozac as far as hot flashes are concerned, willing to change to effexor since she is starting the phentermine Denies polyuria , polydipsia or blurred vision, fasting sugars are seldom over 110   Review of Systems See HPI Denies recent fever or chills.  Denies chest congestion, productive cough or wheezing. Denies chest pains, palpitations and leg swelling Denies abdominal pain, nausea, vomiting,diarrhea or constipation.   Denies dysuria, frequency, hesitancy or incontinence. Denies joint pain, swelling and limitation in mobility. Denies headaches, seizures, numbness, or tingling. Denies depression, anxiety or insomnia. Denies skin break down or rash.        Objective:   Physical Exam Patient alert and oriented and in no cardiopulmonary distress.  HEENT: No facial asymmetry, EOMI, mild maxillary  sinus tenderness,  oropharynx pink and moist.  Neck supple no adenopathy.Nasal mucosa erythematous and edematous  Chest: Clear to auscultation bilaterally.  CVS: S1, S2 no murmurs, no S3.  ABD: Soft non tender. Bowel sounds normal.  Ext: No edema  MS: Adequate though reduced  ROM spine, shoulders, hips and knees.  Skin: Intact, no ulcerations  or rash noted.  Psych: Good eye contact, normal affect. Memory intact not anxious or depressed appearing.  CNS: CN 2-12 intact, power, tone and sensation normal throughout.        Assessment & Plan:

## 2011-11-01 LAB — COMPLETE METABOLIC PANEL WITH GFR
Alkaline Phosphatase: 78 U/L (ref 39–117)
BUN: 10 mg/dL (ref 6–23)
CO2: 20 mEq/L (ref 19–32)
Creat: 0.77 mg/dL (ref 0.50–1.10)
GFR, Est African American: >89 mL/min
GFR, Est Non African American: >89 mL/min
Glucose, Bld: 92 mg/dL (ref 70–99)
Total Bilirubin: 0.3 mg/dL (ref 0.3–1.2)

## 2011-11-01 LAB — TSH: TSH: 2.715 u[IU]/mL (ref 0.350–4.500)

## 2011-11-01 LAB — LIPID PANEL
HDL: 50 mg/dL (ref >39)
Total CHOL/HDL Ratio: 3 Ratio
Triglycerides: 80 mg/dL (ref <150)

## 2011-11-01 LAB — HEMOGLOBIN A1C
Hgb A1c MFr Bld: 6.6 % — ABNORMAL HIGH (ref <5.7)
Mean Plasma Glucose: 143 mg/dL — ABNORMAL HIGH (ref <117)

## 2011-11-10 ENCOUNTER — Telehealth: Payer: Self-pay | Admitting: Family Medicine

## 2011-11-11 NOTE — Telephone Encounter (Signed)
Cholesterol, sugar , thyroid , liver are all good. pls refill prozac if needed x 4 month

## 2011-11-14 ENCOUNTER — Other Ambulatory Visit: Payer: Self-pay

## 2011-11-14 DIAGNOSIS — R232 Flushing: Secondary | ICD-10-CM

## 2011-11-14 MED ORDER — FLUOXETINE HCL 10 MG PO CAPS: 10.0000 mg | ORAL_CAPSULE | Freq: Every day | ORAL | Status: DC

## 2011-11-14 NOTE — Telephone Encounter (Signed)
Sent medicine.  Called and left message for pt to return call.

## 2011-11-14 NOTE — Telephone Encounter (Signed)
Pt aware.

## 2011-12-20 ENCOUNTER — Telehealth: Payer: Self-pay | Admitting: Family Medicine

## 2011-12-20 MED ORDER — CYCLOBENZAPRINE HCL 10 MG PO TABS: 10.0000 mg | ORAL_TABLET | Freq: Every evening | ORAL | Status: AC | PRN

## 2011-12-20 NOTE — Telephone Encounter (Signed)
Spoke with pt and she would like for flexeril to be sent in for spasms.  She is aware that she will only take at bedtime because of the effects.  Med sent to pharmacy.

## 2011-12-20 NOTE — Telephone Encounter (Signed)
Tylenol 500mg  one twice daily to 3 times daily for 5 days. If having spasms, send in flexeril 10mg  one at night as needed #14 only and let her know, let her know has sleepy side effect also

## 2012-01-12 ENCOUNTER — Other Ambulatory Visit: Payer: Self-pay | Admitting: Family Medicine

## 2012-01-14 ENCOUNTER — Other Ambulatory Visit: Payer: Self-pay | Admitting: Family Medicine

## 2012-01-20 ENCOUNTER — Ambulatory Visit: Payer: Managed Care, Other (non HMO) | Admitting: Family Medicine

## 2012-01-23 DIAGNOSIS — B028 Zoster with other complications: Secondary | ICD-10-CM

## 2012-01-23 HISTORY — DX: Zoster with other complications: B02.8

## 2012-01-27 ENCOUNTER — Ambulatory Visit: Payer: Managed Care, Other (non HMO) | Admitting: Family Medicine

## 2012-02-03 ENCOUNTER — Other Ambulatory Visit: Payer: Self-pay | Admitting: Family Medicine

## 2012-02-03 ENCOUNTER — Telehealth: Payer: Self-pay | Admitting: Family Medicine

## 2012-02-03 DIAGNOSIS — E669 Obesity, unspecified: Secondary | ICD-10-CM

## 2012-02-03 MED ORDER — PHENTERMINE HCL 37.5 MG PO CAPS
37.5000 mg | ORAL_CAPSULE | ORAL | Status: DC
Start: 2012-02-03 — End: 2012-03-05

## 2012-02-03 NOTE — Telephone Encounter (Signed)
Refilled, waiting on Dr to sign

## 2012-02-15 LAB — HM MAMMOGRAPHY: HM Mammogram: NORMAL

## 2012-02-16 ENCOUNTER — Other Ambulatory Visit: Payer: Self-pay | Admitting: Family Medicine

## 2012-03-01 ENCOUNTER — Encounter: Payer: Self-pay | Admitting: Family Medicine

## 2012-03-05 ENCOUNTER — Ambulatory Visit (INDEPENDENT_AMBULATORY_CARE_PROVIDER_SITE_OTHER): Payer: Managed Care, Other (non HMO) | Admitting: Family Medicine

## 2012-03-05 ENCOUNTER — Other Ambulatory Visit (HOSPITAL_COMMUNITY)
Admission: RE | Admit: 2012-03-05 | Discharge: 2012-03-05 | Disposition: A | Discharge status: Home / Self Care | Payer: Managed Care, Other (non HMO) | Source: Ambulatory Visit | Attending: Family Medicine | Admitting: Family Medicine

## 2012-03-05 ENCOUNTER — Encounter: Payer: Self-pay | Admitting: Family Medicine

## 2012-03-05 VITALS — BP 120/70 | HR 87 | Resp 16 | Ht 66.5 in | Wt 325.0 lb

## 2012-03-05 DIAGNOSIS — R5381 Other malaise: Secondary | ICD-10-CM

## 2012-03-05 DIAGNOSIS — R5383 Other fatigue: Secondary | ICD-10-CM

## 2012-03-05 DIAGNOSIS — E119 Type 2 diabetes mellitus without complications: Secondary | ICD-10-CM

## 2012-03-05 DIAGNOSIS — Z01419 Encounter for gynecological examination (general) (routine) without abnormal findings: Secondary | ICD-10-CM | POA: Insufficient documentation

## 2012-03-05 DIAGNOSIS — J309 Allergic rhinitis, unspecified: Secondary | ICD-10-CM

## 2012-03-05 DIAGNOSIS — F4323 Adjustment disorder with mixed anxiety and depressed mood: Secondary | ICD-10-CM

## 2012-03-05 DIAGNOSIS — Z0000 Encounter for general adult medical examination without abnormal findings: Secondary | ICD-10-CM

## 2012-03-05 DIAGNOSIS — Z1211 Encounter for screening for malignant neoplasm of colon: Secondary | ICD-10-CM

## 2012-03-05 DIAGNOSIS — F17200 Nicotine dependence, unspecified, uncomplicated: Secondary | ICD-10-CM

## 2012-03-05 DIAGNOSIS — Z124 Encounter for screening for malignant neoplasm of cervix: Secondary | ICD-10-CM

## 2012-03-05 LAB — POC HEMOCCULT BLD/STL (OFFICE/1-CARD/DIAGNOSTIC): Fecal Occult Blood, POC: NEGATIVE

## 2012-03-05 MED ORDER — BUPROPION HCL ER (SR) 150 MG PO TB12: 150.0000 mg | ORAL_TABLET | Freq: Two times a day (BID) | ORAL | Status: DC

## 2012-03-05 MED ORDER — METHYLPREDNISOLONE ACETATE 80 MG/ML IJ SUSP
80.0000 mg | Freq: Once | INTRAMUSCULAR | Status: AC
  Administered 2012-03-05: 80 mg via INTRAMUSCULAR

## 2012-03-05 NOTE — Patient Instructions (Addendum)
F/u in 3 month  Stop fluoxetine, take only wellbutrin for anxiety and depression, this will also help with smoking cessation. I recommend you call and attend a class on surgical options for weight loss, phentermine has not helped you and is discontinued.  Please commit to daily exercise and change in diet.  Please cut back by 1 cigarette each week, you need to quit smoking.  Depo medrol 80mg   will be administered for allergies if OK with the treating opthalmologist  HBA1C and CBC  Today  Mammogram report is normal

## 2012-03-06 LAB — CBC WITH DIFFERENTIAL/PLATELET
Basophils Relative: 0 % (ref 0–1)
Hemoglobin: 11.8 g/dL — ABNORMAL LOW (ref 12.0–15.0)
Lymphs Abs: 3.1 10*3/uL (ref 0.7–4.0)
Monocytes Relative: 9 % (ref 3–12)
Neutro Abs: 2.9 10*3/uL (ref 1.7–7.7)
Neutrophils Relative %: 44 % (ref 43–77)
RBC: 5.41 MIL/uL — ABNORMAL HIGH (ref 3.87–5.11)
WBC: 6.8 10*3/uL (ref 4.0–10.5)

## 2012-03-06 LAB — HEMOGLOBIN A1C
Hgb A1c MFr Bld: 6.6 % — ABNORMAL HIGH (ref <5.7)
Mean Plasma Glucose: 143 mg/dL — ABNORMAL HIGH (ref <117)

## 2012-03-09 DIAGNOSIS — Z0000 Encounter for general adult medical examination without abnormal findings: Secondary | ICD-10-CM | POA: Insufficient documentation

## 2012-03-09 NOTE — Assessment & Plan Note (Signed)
Healthy lifestyle discussed and encouraged, pt will see obesity specialist due to ongoing weight gain

## 2012-03-09 NOTE — Assessment & Plan Note (Signed)
Worsened, Patient counseled for approximately 5 minutes regarding the health risks of ongoing nicotine use, specifically all types of cancer, heart disease, stroke and respiratory failure. The options available for help with cessation ,the behavioral changes to assist the process, and the option to either gradully reduce usage  Or abruptly stop.is also discussed. Pt is also encouraged to set specific goals in number of cigarettes used daily, as well as to set a quit date.

## 2012-03-09 NOTE — Assessment & Plan Note (Signed)
Uncontrolled, not suicidal or homicidal, changhe to wellbutrin to help with smoking cessation

## 2012-03-09 NOTE — Progress Notes (Signed)
  Subjective:    Patient ID: [name], female    DOB: [birth year], 46 y.o.   MRN: [id number]  HPI The PT is here for annual exam  and re-evaluation of chronic medical conditions, medication management and review of any available recent lab and radiology data.  Preventive health is updated, specifically  Cancer screening and Immunization.   Questions or concerns regarding consultations or procedures which the PT has had in the interim are  Addressed.Currently being treated for shingles affecting the right eye The PT denies any adverse reactions to current medications since the last visit.  Concerned about weight gain and increased nicotine use. Stressed over her children C/o uncontrolled allergies and requests steroid injection      Review of Systems See HPI Denies recent fever or chills. Denies sinus pressure, nasal congestion, ear pain or sore throat. Denies chest congestion, productive cough or wheezing. Denies chest pains, palpitations and leg swelling Denies abdominal pain, nausea, vomiting,diarrhea or constipation.   Denies dysuria, frequency, hesitancy or incontinence. Denies joint pain, swelling and limitation in mobility. Denies headaches, seizures, numbness, or tingling. Denies uncontrolled  depression, anxiety or insomnia. Denies skin break down or rash.        Objective:   Physical Exam  Pleasant morbidly obese female, alert and oriented x 3, in no cardio-pulmonary distress. Afebrile. HEENT No facial trauma or asymetry. Sinuses non tender.  EOMI, PERTL, fundoscopic exam is normal, no hemorhage or exudate.  External ears normal, tympanic membranes clear. Oropharynx moist, no exudate, fair dentition. Neck: supple, no adenopathy,JVD or thyromegaly.No bruits.  Chest: Clear to ascultation bilaterally.No crackles or wheezes. Non tender to palpation  Breast: No asymetry,no masses. No nipple discharge or inversion. No axillary or supraclavicular  adenopathy  Cardiovascular system; Heart sounds normal,  S1 and  S2 ,no S3.  No murmur, or thrill. Apical beat not displaced Peripheral pulses normal.  Abdomen: Soft, non tender, no organomegaly or masses.Obese No bruits. Bowel sounds normal. No guarding, tenderness or rebound.  Rectal:  No mass. Guaiac negative stool.  GU: External genitalia normal. No lesions. Vaginal canal normal.No discharge. Uterus normal size, no adnexal masses, no cervical motion or adnexal tenderness.  Musculoskeletal exam: Decreased though adequate  ROM of spine, hips , shoulders and knees. No deformity ,swelling or crepitus noted. No muscle wasting or atrophy.   Neurologic: Cranial nerves 2 to 12 intact. Power, tone ,sensation and reflexes normal throughout. No disturbance in gait. No tremor.  Skin: Intact, no ulceration, erythema , scaling or rash noted. Pigmentation normal throughout  Psych; Normal mood and affect. Judgement and concentration normal  Diabetic Foot Check:  Appearance -  calluses Skin - no unusual pallor or redness Sensation - grossly intact to light touch Monofilament testing -  Right - Great toe, medial, central, lateral ball and posterior foot diminished at heels Left - Great toe, medial, central, lateral ball and posterior foot diminished at heels Pulses Left - Dorsalis Pedis and Posterior Tibia normal Right - Dorsalis Pedis and Posterior Tibia normal      Assessment & Plan:

## 2012-05-18 ENCOUNTER — Other Ambulatory Visit: Payer: Self-pay | Admitting: Family Medicine

## 2012-06-30 ENCOUNTER — Other Ambulatory Visit: Payer: Self-pay | Admitting: Family Medicine

## 2012-07-09 ENCOUNTER — Ambulatory Visit (INDEPENDENT_AMBULATORY_CARE_PROVIDER_SITE_OTHER): Payer: Managed Care, Other (non HMO) | Admitting: Family Medicine

## 2012-07-09 ENCOUNTER — Encounter: Payer: Self-pay | Admitting: Family Medicine

## 2012-07-09 VITALS — BP 110/80 | HR 88 | Resp 16 | Ht 66.5 in | Wt 330.0 lb

## 2012-07-09 DIAGNOSIS — E119 Type 2 diabetes mellitus without complications: Secondary | ICD-10-CM

## 2012-07-09 DIAGNOSIS — F4323 Adjustment disorder with mixed anxiety and depressed mood: Secondary | ICD-10-CM

## 2012-07-09 DIAGNOSIS — J449 Chronic obstructive pulmonary disease, unspecified: Secondary | ICD-10-CM

## 2012-07-09 DIAGNOSIS — F17200 Nicotine dependence, unspecified, uncomplicated: Secondary | ICD-10-CM

## 2012-07-09 DIAGNOSIS — E669 Obesity, unspecified: Secondary | ICD-10-CM

## 2012-07-09 DIAGNOSIS — I10 Essential (primary) hypertension: Secondary | ICD-10-CM

## 2012-07-09 MED ORDER — OMEPRAZOLE 40 MG PO CPDR: DELAYED_RELEASE_CAPSULE | ORAL | Status: DC

## 2012-07-09 MED ORDER — PHENTERMINE HCL 37.5 MG PO CAPS: 37.5000 mg | ORAL_CAPSULE | ORAL | Status: DC

## 2012-07-09 MED ORDER — METFORMIN HCL 500 MG PO TABS: 500.0000 mg | ORAL_TABLET | Freq: Every day | ORAL | Status: DC

## 2012-07-09 MED ORDER — PAROXETINE HCL 10 MG PO TABS: 10.0000 mg | ORAL_TABLET | Freq: Every day | ORAL | Status: DC

## 2012-07-09 MED ORDER — ALBUTEROL SULFATE HFA 108 (90 BASE) MCG/ACT IN AERS
2.0000 | INHALATION_SPRAY | Freq: Four times a day (QID) | RESPIRATORY_TRACT | Status: DC | PRN
Start: 2012-07-09 — End: 2013-07-22

## 2012-07-09 MED ORDER — LORATADINE 10 MG PO TABS: 10.0000 mg | ORAL_TABLET | Freq: Every day | ORAL | Status: DC

## 2012-07-09 NOTE — Assessment & Plan Note (Signed)
Current 10 per day, unchanged Patient counseled for approximately 5 minutes regarding the health risks of ongoing nicotine use, specifically all types of cancer, heart disease, stroke and respiratory failure. The options available for help with cessation ,the behavioral changes to assist the process, and the option to either gradully reduce usage  Or abruptly stop.is also discussed. Pt is also encouraged to set specific goals in number of cigarettes used daily, as well as to set a quit date.

## 2012-07-09 NOTE — Progress Notes (Signed)
  Subjective:    Patient ID: [name], female    DOB: March 08, 1966, 46 y.o.   MRN: [id number]  HPI The PT is here for follow up and re-evaluation of chronic medical conditions, medication management and review of any available recent lab and radiology data.  Preventive health is updated, specifically  Cancer screening and Immunization.   Questions or concerns regarding consultations or procedures which the PT has had in the interim are  Addressed.Has upcoming re eval of vision, new eye problems since this year  Hot flashes remain uncontrolled, intolerant of effexor though it helped. Takes one metformin daily, 2 make her sick Gaining weight , no interest in sex, wheezing and experiencing increased shortness of breath with minimal activity Depressed and anxious about ongoing weight gain, no nterest in surgery, wants to resume phentermine and work at lifestyle     Review of Systems See HPI Denies recent fever or chills. Denies sinus pressure, nasal congestion, ear pain or sore throat.  Denies chest pains, palpitations and leg swelling Denies abdominal pain, nausea, vomiting,diarrhea or constipation.   Denies dysuria, frequency, hesitancy or incontinence. Denies joint pain, swelling and limitation in mobility. Denies headaches, seizures, numbness, or tingling. Denies skin break down or rash.        Objective:   Physical Exam  Patient alert and oriented and in no cardiopulmonary distress.  HEENT: No facial asymmetry, EOMI, no sinus tenderness,  oropharynx pink and moist.  Neck supple no adenopathy.  Chest: decreased air entry bilaterally, few scattered crackles, no wheezes  CVS: S1, S2 no murmurs, no S3.  ABD: Soft non tender. Bowel sounds normal.  Ext: No edema  MS: Adequate ROM spine, shoulders, hips and knees.  Skin: Intact, no ulcerations or rash noted.  Psych: Good eye contact, normal affect. Memory intact mildly anxious not  depressed appearing.  CNS: CN 2-12  intact, power, tone and sensation normal throughout.       Assessment & Plan:

## 2012-07-09 NOTE — Patient Instructions (Addendum)
F/u in 2.5 month  Start paxil for anxiety and depression.  Commit to daily exercise at the gym for at least 30 minutes, start slow and be consistent  Commit to reducing cigarettes by 1 each month, start at 9 per day today, YOU NEED TO QUIT.  Resume phemntermine capsule 1 daily  Weight loss goal of 1 to 2 pounds per week  HBA1C cem 7 and EGFR today

## 2012-07-10 LAB — MICROALBUMIN / CREATININE URINE RATIO: Microalb Creat Ratio: 2.9 mg/g (ref 0.0–30.0)

## 2012-07-10 LAB — BASIC METABOLIC PANEL
BUN: 10 mg/dL (ref 6–23)
Calcium: 9.2 mg/dL (ref 8.4–10.5)
Creat: 0.7 mg/dL (ref 0.50–1.10)
Glucose, Bld: 82 mg/dL (ref 70–99)

## 2012-07-10 LAB — HEMOGLOBIN A1C
Hgb A1c MFr Bld: 6.7 % — ABNORMAL HIGH (ref <5.7)
Mean Plasma Glucose: 146 mg/dL — ABNORMAL HIGH (ref <117)

## 2012-08-05 NOTE — Assessment & Plan Note (Signed)
Uncontrolled, addition of wellbutrin esop to assist with smoking cessation

## 2012-08-05 NOTE — Assessment & Plan Note (Signed)
Controlled, no change in medication DASH diet and commitment to daily physical activity for a minimum of 30 minutes discussed and encouraged, as a part of hypertension management. The importance of attaining a healthy weight is also discussed.  

## 2012-08-05 NOTE — Assessment & Plan Note (Signed)
Deteriorated. Patient re-educated about  the importance of commitment to a  minimum of 150 minutes of exercise per week. The importance of healthy food choices with portion control discussed. Encouraged to start a food diary, count calories and to consider  joining a support group. Sample diet sheets offered. Goals set by the patient for the next several months.    

## 2012-08-05 NOTE — Assessment & Plan Note (Signed)
Deteriorating due to ongoing nicotine use, counseled to quirt. Pt would benefit from maintenance inhaler will address at next visit

## 2012-08-05 NOTE — Assessment & Plan Note (Signed)
Controlled, no change in medication Patient advised to reduce carb and sweets, commit to regular physical activity, take meds as prescribed, test blood as directed, and attempt to lose weight, to improve blood sugar control.  

## 2012-08-23 ENCOUNTER — Telehealth: Payer: Self-pay | Admitting: Family Medicine

## 2012-08-23 DIAGNOSIS — E119 Type 2 diabetes mellitus without complications: Secondary | ICD-10-CM

## 2012-08-23 MED ORDER — PHENTERMINE HCL 37.5 MG PO TABS: 37.5000 mg | ORAL_TABLET | Freq: Every day | ORAL | Status: DC

## 2012-08-23 NOTE — Telephone Encounter (Signed)
Wanted you to know she is going to start back taking the metformin twice a day. She can tell a difference since she has been doing only qd. She has also been constipated. Ok to send in refills for bid?

## 2012-08-23 NOTE — Telephone Encounter (Signed)
Yes pls refill at twice daily

## 2012-08-24 MED ORDER — METFORMIN HCL 500 MG PO TABS: 500.0000 mg | ORAL_TABLET | Freq: Two times a day (BID) | ORAL | Status: DC

## 2012-08-24 NOTE — Telephone Encounter (Signed)
Med refilled.

## 2012-09-03 ENCOUNTER — Other Ambulatory Visit: Payer: Self-pay

## 2012-09-03 ENCOUNTER — Other Ambulatory Visit: Payer: Self-pay | Admitting: Family Medicine

## 2012-09-03 ENCOUNTER — Telehealth: Payer: Self-pay | Admitting: Family Medicine

## 2012-09-03 MED ORDER — ONDANSETRON HCL 4 MG PO TABS: 4.0000 mg | ORAL_TABLET | Freq: Every day | ORAL | Status: DC | PRN

## 2012-09-03 MED ORDER — DIPHENOXYLATE-ATROPINE 2.5-0.025 MG PO TABS: 1.0000 | ORAL_TABLET | Freq: Four times a day (QID) | ORAL | Status: DC | PRN

## 2012-09-03 NOTE — Telephone Encounter (Signed)
Spoke with patient and she states that she has had chill since yesterday along with diarrhea.  She has been to the bathroom 3 times today.  She is also nauseated.  She is asking if meds can be sent.

## 2012-09-03 NOTE — Telephone Encounter (Signed)
rx faxed

## 2012-09-03 NOTE — Telephone Encounter (Signed)
pls fax in zofran and lomotil

## 2012-09-03 NOTE — Telephone Encounter (Signed)
Lomotil and zofran sent in for symptoms, pt aware

## 2012-09-04 ENCOUNTER — Telehealth: Payer: Self-pay

## 2012-09-04 NOTE — Telephone Encounter (Signed)
Please advise she needs appt before any work excuse will be given per Dr

## 2012-09-04 NOTE — Telephone Encounter (Signed)
What you told thye pt is corrrect, let her know this please

## 2012-09-04 NOTE — Telephone Encounter (Signed)
Pt states she is feeling better since she took the meds but was needing a work excuse to she could use her sick time for Mon and tues. I told her she needed an appt and could see Dr Jeanice Lim if were full but she kept insisting I ask first because there was no need for her to come in if she felt better. I told her to get an excuse that was required but she still wanted to ask first

## 2012-09-12 ENCOUNTER — Other Ambulatory Visit: Payer: Self-pay | Admitting: Family Medicine

## 2012-10-01 ENCOUNTER — Ambulatory Visit: Payer: Managed Care, Other (non HMO) | Admitting: Family Medicine

## 2012-10-16 ENCOUNTER — Ambulatory Visit: Payer: Managed Care, Other (non HMO) | Admitting: Family Medicine

## 2012-10-17 ENCOUNTER — Other Ambulatory Visit: Payer: Self-pay | Admitting: Family Medicine

## 2012-11-20 ENCOUNTER — Encounter: Payer: Self-pay | Admitting: Family Medicine

## 2012-11-20 ENCOUNTER — Other Ambulatory Visit: Payer: Self-pay | Admitting: Family Medicine

## 2012-11-20 ENCOUNTER — Ambulatory Visit (INDEPENDENT_AMBULATORY_CARE_PROVIDER_SITE_OTHER): Payer: Managed Care, Other (non HMO) | Admitting: Family Medicine

## 2012-11-20 VITALS — BP 112/78 | HR 99 | Resp 18 | Ht 66.5 in | Wt 338.0 lb

## 2012-11-20 DIAGNOSIS — R232 Flushing: Secondary | ICD-10-CM

## 2012-11-20 DIAGNOSIS — F17200 Nicotine dependence, unspecified, uncomplicated: Secondary | ICD-10-CM

## 2012-11-20 DIAGNOSIS — R5381 Other malaise: Secondary | ICD-10-CM

## 2012-11-20 DIAGNOSIS — I10 Essential (primary) hypertension: Secondary | ICD-10-CM

## 2012-11-20 DIAGNOSIS — E119 Type 2 diabetes mellitus without complications: Secondary | ICD-10-CM

## 2012-11-20 DIAGNOSIS — R5383 Other fatigue: Secondary | ICD-10-CM

## 2012-11-20 DIAGNOSIS — F4323 Adjustment disorder with mixed anxiety and depressed mood: Secondary | ICD-10-CM

## 2012-11-20 DIAGNOSIS — N951 Menopausal and female climacteric states: Secondary | ICD-10-CM

## 2012-11-20 DIAGNOSIS — J309 Allergic rhinitis, unspecified: Secondary | ICD-10-CM

## 2012-11-20 DIAGNOSIS — E669 Obesity, unspecified: Secondary | ICD-10-CM

## 2012-11-20 LAB — TSH: TSH: 1.255 u[IU]/mL (ref 0.350–4.500)

## 2012-11-20 LAB — HEMOGLOBIN A1C
Hgb A1c MFr Bld: 6.2 % — ABNORMAL HIGH (ref <5.7)
Mean Plasma Glucose: 131 mg/dL — ABNORMAL HIGH (ref <117)

## 2012-11-20 MED ORDER — VENLAFAXINE HCL ER 37.5 MG PO CP24: 37.5000 mg | ORAL_CAPSULE | Freq: Every day | ORAL | Status: DC

## 2012-11-20 MED ORDER — ALPRAZOLAM 0.5 MG PO TABS: 0.5000 mg | ORAL_TABLET | Freq: Every evening | ORAL | Status: DC | PRN

## 2012-11-20 NOTE — Patient Instructions (Addendum)
F/u in 3.5 month  HBA1C, , cmp, and EGFR, TSH today  Please commit to daily exercise, this is vital for depression and weight issues   You need to stop smoking today, join the grp through your insurance to help with this  You will be referred for therapy  Continue daily phenetermine, weight loss goal of 4 pounds per month  Depomedrol 80 mg IM today for uncontrolled allergies

## 2012-11-20 NOTE — Progress Notes (Signed)
  Subjective:    Patient ID: [name], female    DOB: 02/28/1966, 47 y.o.   MRN: [id number]  HPI    Review of Systems     Objective:   Physical Exam        Assessment & Plan:

## 2012-11-21 LAB — COMPLETE METABOLIC PANEL WITH GFR
ALT: 10 U/L (ref 0–35)
AST: 11 U/L (ref 0–37)
Alkaline Phosphatase: 88 U/L (ref 39–117)
Calcium: 9.6 mg/dL (ref 8.4–10.5)
Chloride: 103 mEq/L (ref 96–112)
Creat: 0.75 mg/dL (ref 0.50–1.10)

## 2012-11-21 MED ORDER — METHYLPREDNISOLONE ACETATE 80 MG/ML IJ SUSP
80.0000 mg | Freq: Once | INTRAMUSCULAR | Status: AC
  Administered 2012-11-20: 80 mg via INTRAMUSCULAR

## 2012-11-25 NOTE — Assessment & Plan Note (Signed)
Worsened, with poor sleep and hair loss, pt to start effexor which will also help with depression

## 2012-11-25 NOTE — Assessment & Plan Note (Signed)
Controlled, no change in medication Patient advised to reduce carb and sweets, commit to regular physical activity, take meds as prescribed, test blood as directed, and attempt to lose weight, to improve blood sugar control.  

## 2012-11-25 NOTE — Assessment & Plan Note (Signed)
Controlled, no change in medication DASH diet and commitment to daily physical activity for a minimum of 30 minutes discussed and encouraged, as a part of hypertension management. The importance of attaining a healthy weight is also discussed.  

## 2012-11-25 NOTE — Assessment & Plan Note (Signed)
Uncontrolled, with increase feeling of being overwhelmed and stessed. Not suicidal or homicidal Refer to therapy and start medication. Pt also to start regular physical activity

## 2012-11-25 NOTE — Assessment & Plan Note (Signed)
Worsened Patient counseled for approximately 5 minutes regarding the health risks of ongoing nicotine use, specifically all types of cancer, heart disease, stroke and respiratory failure. The options available for help with cessation ,the behavioral changes to assist the process, and the option to either gradully reduce usage  Or abruptly stop.is also discussed. Pt is also encouraged to set specific goals in number of cigarettes used daily, as well as to set a quit date.  

## 2012-11-25 NOTE — Assessment & Plan Note (Signed)
Deteriorated. Patient re-educated about  the importance of commitment to a  minimum of 150 minutes of exercise per week. The importance of healthy food choices with portion control discussed. Encouraged to start a food diary, count calories and to consider  joining a support group. Sample diet sheets offered. Goals set by the patient for the next several months.    

## 2013-02-06 ENCOUNTER — Other Ambulatory Visit: Payer: Self-pay | Admitting: Family Medicine

## 2013-02-25 ENCOUNTER — Other Ambulatory Visit: Payer: Self-pay | Admitting: Family Medicine

## 2013-03-05 ENCOUNTER — Other Ambulatory Visit: Payer: Self-pay | Admitting: Family Medicine

## 2013-03-05 ENCOUNTER — Telehealth: Payer: Self-pay

## 2013-03-05 MED ORDER — DIPHENOXYLATE-ATROPINE 2.5-0.025 MG PO TABS: 1.0000 | ORAL_TABLET | Freq: Four times a day (QID) | ORAL | Status: DC | PRN

## 2013-03-05 NOTE — Telephone Encounter (Signed)
lomitil printed, pls fax and let her know  Discuss BRAT and hand hygiene

## 2013-03-05 NOTE — Telephone Encounter (Signed)
Called patient and left message.

## 2013-03-06 ENCOUNTER — Encounter: Payer: Self-pay | Admitting: Family Medicine

## 2013-03-18 ENCOUNTER — Ambulatory Visit (INDEPENDENT_AMBULATORY_CARE_PROVIDER_SITE_OTHER): Payer: Managed Care, Other (non HMO) | Admitting: Family Medicine

## 2013-03-18 ENCOUNTER — Encounter: Payer: Self-pay | Admitting: Family Medicine

## 2013-03-18 VITALS — BP 128/82 | HR 86 | Resp 18 | Ht 66.5 in | Wt 331.1 lb

## 2013-03-18 DIAGNOSIS — E119 Type 2 diabetes mellitus without complications: Secondary | ICD-10-CM

## 2013-03-18 DIAGNOSIS — F4323 Adjustment disorder with mixed anxiety and depressed mood: Secondary | ICD-10-CM

## 2013-03-18 DIAGNOSIS — N951 Menopausal and female climacteric states: Secondary | ICD-10-CM

## 2013-03-18 DIAGNOSIS — Z13220 Encounter for screening for lipoid disorders: Secondary | ICD-10-CM

## 2013-03-18 DIAGNOSIS — G4700 Insomnia, unspecified: Secondary | ICD-10-CM

## 2013-03-18 DIAGNOSIS — R232 Flushing: Secondary | ICD-10-CM

## 2013-03-18 DIAGNOSIS — F17200 Nicotine dependence, unspecified, uncomplicated: Secondary | ICD-10-CM

## 2013-03-18 DIAGNOSIS — B351 Tinea unguium: Secondary | ICD-10-CM

## 2013-03-18 DIAGNOSIS — I10 Essential (primary) hypertension: Secondary | ICD-10-CM

## 2013-03-18 DIAGNOSIS — E669 Obesity, unspecified: Secondary | ICD-10-CM

## 2013-03-18 LAB — LIPID PANEL
HDL: 54 mg/dL (ref >39)
LDL Cholesterol: 95 mg/dL (ref 0–99)
Total CHOL/HDL Ratio: 3.2 Ratio
VLDL: 22 mg/dL (ref 0–40)

## 2013-03-18 LAB — BASIC METABOLIC PANEL
Calcium: 9.2 mg/dL (ref 8.4–10.5)
Glucose, Bld: 95 mg/dL (ref 70–99)
Potassium: 4.6 mEq/L (ref 3.5–5.3)
Sodium: 140 mEq/L (ref 135–145)

## 2013-03-18 LAB — CBC
Platelets: 275 10*3/uL (ref 150–400)
RBC: 5.72 MIL/uL — ABNORMAL HIGH (ref 3.87–5.11)
WBC: 6.4 10*3/uL (ref 4.0–10.5)

## 2013-03-18 MED ORDER — TERBINAFINE HCL 250 MG PO TABS: 250.0000 mg | ORAL_TABLET | Freq: Every day | ORAL | Status: DC

## 2013-03-18 MED ORDER — PHENTERMINE HCL 37.5 MG PO CAPS: ORAL_CAPSULE | ORAL | Status: DC

## 2013-03-18 NOTE — Assessment & Plan Note (Signed)
Unchanged. Patient counseled for approximately 5 minutes regarding the health risks of ongoing nicotine use, specifically all types of cancer, heart disease, stroke and respiratory failure. The options available for help with cessation ,the behavioral changes to assist the process, and the option to either gradully reduce usage  Or abruptly stop.is also discussed. Pt is also encouraged to set specific goals in number of cigarettes used daily, as well as to set a quit date.  

## 2013-03-18 NOTE — Assessment & Plan Note (Signed)
Updated lab today  Patient advised to reduce carb and sweets, commit to regular physical activity, take meds as prescribed, test blood as directed, and attempt to lose weight, to improve blood sugar control.  

## 2013-03-18 NOTE — Progress Notes (Signed)
  Subjective:    Patient ID: [name], female    DOB: 06-Apr-1966, 47 y.o.   MRN: [id number]  HPI  The PT is here for follow up and re-evaluation of chronic medical conditions, medication management and review of any available recent lab and radiology data.  Preventive health is updated, specifically  Cancer screening and Immunization.   . The PT denies any adverse reactions to current medications since the last visit. Reports excellent response to effexor, hot flashes have subsided , and her moos is significantly improved, not as anxious and depressed as previously C/o tingling at fingertips of right hand , she has carpal tunnel syndrome but does not want to do anything about this at this time Very happy with weight loss but reports inconsistent use of phentermine , re educated her about the  need for consistency Denies polyuria, polydipsia or blurred vision   Review of Systems See HPI Denies recent fever or chills. Denies sinus pressure, nasal congestion, ear pain or sore throat. Denies chest congestion, productive cough or wheezing. Denies chest pains, palpitations and leg swelling Denies abdominal pain, nausea, vomiting,diarrhea or constipation.   Denies dysuria, frequency, hesitancy or incontinence. Denies joint pain, swelling and limitation in mobility. Denies headaches, seizures, numbness, or tingling. Denies depression, anxiety or insomnia. Denies skin break down or rash.        Objective:   Physical Exam Patient alert and oriented and in no cardiopulmonary distress.  HEENT: No facial asymmetry, EOMI, no sinus tenderness,  oropharynx pink and moist.  Neck supple no adenopathy.  Chest: Clear to auscultation bilaterally.  CVS: S1, S2 no murmurs, no S3.  ABD: Soft non tender. Bowel sounds normal.  Ext: No edema  MS: Adequate ROM spine, shoulders, hips and knees.  Skin: Intact, no ulcerations or rash noted.  Psych: Good eye contact, normal affect. Memory  intact not anxious or depressed appearing.  CNS: CN 2-12 intact, power, tone and sensation normal throughout.        Assessment & Plan:

## 2013-03-18 NOTE — Patient Instructions (Addendum)
CPE and pap 2nd week in December  New medication once daily for toenail infection for 3 month  Reconsider the pneumonia vaccine you need this  Recommit to healthy lifestyle, weight loss goal is 3 pounds per month  Cigarettes reduce by 1 every 2 weeks, you need to quit  Mammogram is normal

## 2013-03-18 NOTE — Assessment & Plan Note (Signed)
M,arked improvement on effexor, continue same

## 2013-03-18 NOTE — Assessment & Plan Note (Signed)
Marked improvement on effexor, also improvement in depression, will continue same

## 2013-03-18 NOTE — Assessment & Plan Note (Signed)
Improved. Pt applauded on succesful weight loss through lifestyle change, and encouraged to continue same. Weight loss goal set for the next several months.  

## 2013-03-18 NOTE — Assessment & Plan Note (Signed)
Controlled, no change in medication DASH diet and commitment to daily physical activity for a minimum of 30 minutes discussed and encouraged, as a part of hypertension management. The importance of attaining a healthy weight is also discussed.  

## 2013-03-18 NOTE — Assessment & Plan Note (Signed)
Improved , uses xanax as needed, and uses it less

## 2013-03-18 NOTE — Assessment & Plan Note (Signed)
Significant fungal toenail infection. Terbinafine for 3 months

## 2013-05-23 ENCOUNTER — Ambulatory Visit: Payer: Managed Care, Other (non HMO) | Admitting: Family Medicine

## 2013-06-04 ENCOUNTER — Telehealth: Payer: Self-pay | Admitting: Family Medicine

## 2013-06-07 NOTE — Telephone Encounter (Signed)
Patient stated that  her strips expired 2013 and wants to know can she use them Dr. Lodema Hong said no needs new ones patient is aware

## 2013-06-07 NOTE — Telephone Encounter (Signed)
The patient is feeling better she was not taking her medicine right

## 2013-06-07 NOTE — Telephone Encounter (Signed)
pls give her an appt for Monday

## 2013-06-10 ENCOUNTER — Ambulatory Visit: Payer: Managed Care, Other (non HMO) | Admitting: Family Medicine

## 2013-06-16 ENCOUNTER — Other Ambulatory Visit: Payer: Self-pay | Admitting: Family Medicine

## 2013-06-17 ENCOUNTER — Other Ambulatory Visit: Payer: Self-pay | Admitting: Family Medicine

## 2013-07-04 ENCOUNTER — Encounter: Payer: Managed Care, Other (non HMO) | Admitting: Family Medicine

## 2013-07-14 ENCOUNTER — Other Ambulatory Visit: Payer: Self-pay | Admitting: Family Medicine

## 2013-07-17 ENCOUNTER — Other Ambulatory Visit: Payer: Self-pay | Admitting: Family Medicine

## 2013-07-22 ENCOUNTER — Encounter: Payer: Self-pay | Admitting: Family Medicine

## 2013-07-22 ENCOUNTER — Encounter (INDEPENDENT_AMBULATORY_CARE_PROVIDER_SITE_OTHER): Payer: Self-pay

## 2013-07-22 ENCOUNTER — Ambulatory Visit (INDEPENDENT_AMBULATORY_CARE_PROVIDER_SITE_OTHER): Payer: Managed Care, Other (non HMO) | Admitting: Family Medicine

## 2013-07-22 ENCOUNTER — Other Ambulatory Visit (HOSPITAL_COMMUNITY)
Admission: RE | Admit: 2013-07-22 | Discharge: 2013-07-22 | Disposition: A | Discharge status: Home / Self Care | Payer: Managed Care, Other (non HMO) | Source: Ambulatory Visit | Attending: Family Medicine | Admitting: Family Medicine

## 2013-07-22 VITALS — BP 130/82 | HR 100 | Resp 20 | Ht 66.5 in | Wt 338.0 lb

## 2013-07-22 DIAGNOSIS — J449 Chronic obstructive pulmonary disease, unspecified: Secondary | ICD-10-CM

## 2013-07-22 DIAGNOSIS — N951 Menopausal and female climacteric states: Secondary | ICD-10-CM

## 2013-07-22 DIAGNOSIS — E119 Type 2 diabetes mellitus without complications: Secondary | ICD-10-CM

## 2013-07-22 DIAGNOSIS — Z113 Encounter for screening for infections with a predominantly sexual mode of transmission: Secondary | ICD-10-CM

## 2013-07-22 DIAGNOSIS — R0609 Other forms of dyspnea: Secondary | ICD-10-CM

## 2013-07-22 DIAGNOSIS — Z1211 Encounter for screening for malignant neoplasm of colon: Secondary | ICD-10-CM

## 2013-07-22 DIAGNOSIS — Z23 Encounter for immunization: Secondary | ICD-10-CM

## 2013-07-22 DIAGNOSIS — Z6841 Body Mass Index (BMI) 40.0 and over, adult: Secondary | ICD-10-CM

## 2013-07-22 DIAGNOSIS — Z0000 Encounter for general adult medical examination without abnormal findings: Secondary | ICD-10-CM

## 2013-07-22 DIAGNOSIS — F17200 Nicotine dependence, unspecified, uncomplicated: Secondary | ICD-10-CM

## 2013-07-22 DIAGNOSIS — I10 Essential (primary) hypertension: Secondary | ICD-10-CM

## 2013-07-22 DIAGNOSIS — J309 Allergic rhinitis, unspecified: Secondary | ICD-10-CM

## 2013-07-22 DIAGNOSIS — R232 Flushing: Secondary | ICD-10-CM

## 2013-07-22 DIAGNOSIS — Z01419 Encounter for gynecological examination (general) (routine) without abnormal findings: Secondary | ICD-10-CM | POA: Insufficient documentation

## 2013-07-22 DIAGNOSIS — Z1151 Encounter for screening for human papillomavirus (HPV): Secondary | ICD-10-CM | POA: Insufficient documentation

## 2013-07-22 LAB — POC HEMOCCULT BLD/STL (OFFICE/1-CARD/DIAGNOSTIC): Fecal Occult Blood, POC: NEGATIVE

## 2013-07-22 MED ORDER — METFORMIN HCL 500 MG PO TABS: 500.0000 mg | ORAL_TABLET | Freq: Two times a day (BID) | ORAL | Status: DC

## 2013-07-22 MED ORDER — PHENTERMINE HCL 37.5 MG PO TBDP: 1.0000 | ORAL_TABLET | Freq: Every day | ORAL | Status: DC

## 2013-07-22 MED ORDER — PROMETHAZINE-DM 6.25-15 MG/5ML PO SYRP: 5.0000 mL | ORAL_SOLUTION | Freq: Every evening | ORAL | Status: DC | PRN

## 2013-07-22 MED ORDER — VENLAFAXINE HCL ER 75 MG PO CP24: 75.0000 mg | ORAL_CAPSULE | Freq: Every day | ORAL | Status: DC

## 2013-07-22 NOTE — Progress Notes (Signed)
   Subjective:    Patient ID: [name], female    DOB: 1966-05-08, 47 y.o.   MRN: [id number]  HPI The PT is here for annual exam, medication management and review of any available recent lab and radiology data.  Preventive health is updated, specifically  Cancer screening and Immunization.   C/o increased exertional dyspnea in the past 3 months, frustration with inability to lose weight despite some effort, unfortunately inconsistently, and the desire to get surgical help for this. C/o uncontrolled allergy symptoms with excessive cough in the supine position, requests cough suppressant medication for this, denies fever or chills C/o uncontrolled hot flashes disturbing sleep espescially, requests med adjustment if possible    Review of Systems See HPI Denies recent fever or chills.  Denies chest congestion, productive cough or wheezing. Denies chest pains, palpitations, PND , orthopnea and leg swelling Denies abdominal pain, nausea, vomiting,diarrhea or constipation.   Denies dysuria, frequency, hesitancy or incontinence. Denies joint pain, swelling and limitation in mobility. Denies headaches, seizures, numbness, or tingling. Denies uncontrolled  depression, anxiety or insomnia. Denies skin break down or rash.        Objective:   Physical Exam  Pleasant well nourished female, alert and oriented x 3, in no cardio-pulmonary distress. Afebrile. HEENT No facial trauma or asymetry. Sinuses non tender.  EOMI, PERTL, fundoscopic exam  no hemorhage or exudate.  External ears normal, tympanic membranes clear. Erythema and edema of nasal mucosa. Oropharynx moist, no exudate, good dentition. Neck: supple, no adenopathy,JVD or thyromegaly.No bruits.  Chest: Clear to ascultation bilaterally.No crackles or wheezes. Non tender to palpation  Breast: No asymetry,no masses. No nipple discharge or inversion. No axillary or supraclavicular adenopathy  Cardiovascular  system; Heart sounds normal,  S1 and  S2 ,no S3.  No murmur, or thrill. Apical beat not displaced Peripheral pulses normal.  Abdomen: Soft, non tender, no organomegaly or masses. No bruits. Bowel sounds normal. No guarding, tenderness or rebound.  Rectal:  No mass. Guaiac negative stool.  GU: External genitalia normal. No lesions. Vaginal canal normal.fishy grey  discharge. Uterus normal size, no adnexal masses, no cervical motion or adnexal tenderness.Exam limited by body size  Musculoskeletal exam: Full ROM of spine, hips , shoulders and knees. No deformity ,swelling or crepitus noted. No muscle wasting or atrophy.   Neurologic: Cranial nerves 2 to 12 intact. Power, tone ,sensation and reflexes normal throughout. No disturbance in gait. No tremor.  Skin: Intact, no ulceration, erythema , scaling or rash noted. Pigmentation normal throughout  Psych; Normal mood and affect. Judgement and concentration normal       Assessment & Plan:

## 2013-07-22 NOTE — Patient Instructions (Addendum)
F/u in 4 month, call if you need me before  EKG today based on increased risk of heart disease and new increased shortness of breath  CXR order sent to [facility], please do tomorrow as promised.  HBA1C , chem 7 and EGFR today   Sign into "my chart " in office today before you leave please.  You need to stop smoking, as we discussed reduce by one cigarette each week, then in 10 weeks you will have quit.  You are referred for surgical consultation for weight management in [place], expect a call from referral staff about this, call back in 1 week if you hav had no feedback from our office please. Thias is important you absolutely NEED to lose weight  Flu and pneumonia vaccines today  Microalb from office today  Start taking  BOTH claritin and flonase EVERY day, to improve allergy symptoms, and therefore reduce night cough. Phenrgan DM is sent in for as needed use only for excessive cough at night only  EKG shows no sign of acute heart damage , but ongoing cigarette smoking will increase your risk of heart disease and stroke, as well as all forms of cancer so you need to QUIT  Increase in effexor dose to TWO capsules daily for hot flashes, take 2nd in here before you leave, new script , which you will fill in 2 weeks is for 75mg  ONE daily  Nicotine Addiction Nicotine can act as both a stimulant (excites/activates) and a sedative (calms/quiets). Immediately after exposure to nicotine, there is a "kick" caused in part by the drug's stimulation of the adrenal glands and resulting discharge of adrenaline (epinephrine). The rush of adrenaline stimulates the body and causes a sudden release of sugar. This means that smokers are always slightly hyperglycemic. Hyperglycemic means that the blood sugar is high, just like in diabetics. Nicotine also decreases the amount of insulin which helps control sugar levels in the body. There is an increase in blood pressure, breathing, and the rate of  heart beats.  In addition, nicotine indirectly causes a release of dopamine in the brain that controls pleasure and motivation. A similar reaction is seen with other drugs of abuse, such as cocaine and heroin. This dopamine release is thought to cause the pleasurable sensations when smoking. In some different cases, nicotine can also create a calming effect, depending on sensitivity of the smoker's nervous system and the dose of nicotine taken. WHAT HAPPENS WHEN NICOTINE IS TAKEN FOR LONG PERIODS OF TIME?  Long-term use of nicotine results in addiction. It is difficult to stop.  Repeated use of nicotine creates tolerance. Higher doses of nicotine are needed to get the "kick." When nicotine use is stopped, withdrawal may last a month or more. Withdrawal may begin within a few hours after the last cigarette. Symptoms peak within the first few days and may lessen within a few weeks. For some people, however, symptoms may last for months or longer. Withdrawal symptoms include:   Irritability.  Craving.  Learning and attention deficits.  Sleep disturbances.  Increased appetite. Craving for tobacco may last for 6 months or longer. Many behaviors done while using nicotine can also play a part in the severity of withdrawal symptoms. For some people, the feel, smell, and sight of a cigarette and the ritual of obtaining, handling, lighting, and smoking the cigarette are closely linked with the pleasure of smoking. When stopped, they also miss the related behaviors which make the withdrawal or craving worse. While nicotine  gum and patches may lessen the drug aspects of withdrawal, cravings often persist. WHAT ARE THE MEDICAL CONSEQUENCES OF NICOTINE USE?  Nicotine addiction accounts for one-third of all cancers. The top cancer caused by tobacco is lung cancer. Lung cancer is the number one cancer killer of both men and women.  Smoking is also associated with cancers of  the:  Mouth.  Pharynx.  Larynx.  Esophagus.  Stomach.  Pancreas.  Cervix.  Kidney.  Ureter.  Bladder.  Smoking also causes lung diseases such as lasting (chronic) bronchitis and emphysema.  It worsens asthma in adults and children.  Smoking increases the risk of heart disease, including:  Stroke.  Heart attack.  Vascular disease.  Aneurysm.  Passive or secondary smoke can also increase medical risks including:  Asthma in children.  Sudden Infant Death Syndrome (SIDS).  Additionally, dropped cigarettes are the leading cause of residential fire fatalities.  Nicotine poisoning has been reported from accidental ingestion of tobacco products by children and pets. Death usually results in a few minutes from respiratory failure (when a person stops breathing) caused by paralysis. TREATMENT   Medication. Nicotine replacement medicines such as nicotine gum and the patch are used to stop smoking. These medicines gradually lower the dosage of nicotine in the body. These medicines do not contain the carbon monoxide and other toxins found in tobacco smoke.  Hypnotherapy.  Relaxation therapy.  Nicotine Anonymous (a 12-step support program). Find times and locations in your local yellow pages. Document Released: 03/16/2004 Document Revised: 10/03/2011 Document Reviewed: 08/08/2007 [facility] Patient Information 2014 [place], [place].

## 2013-07-23 LAB — MICROALBUMIN / CREATININE URINE RATIO
Microalb Creat Ratio: 7.3 mg/g (ref 0.0–30.0)
Microalb, Ur: 1.51 mg/dL (ref 0.00–1.89)

## 2013-07-23 LAB — COMPLETE METABOLIC PANEL WITH GFR
ALT: 11 U/L (ref 0–35)
AST: 14 U/L (ref 0–37)
CO2: 28 mEq/L (ref 19–32)
Calcium: 9.7 mg/dL (ref 8.4–10.5)
Chloride: 103 mEq/L (ref 96–112)
Creat: 0.77 mg/dL (ref 0.50–1.10)
GFR, Est African American: >89 mL/min
Glucose, Bld: 89 mg/dL (ref 70–99)
Potassium: 4.4 mEq/L (ref 3.5–5.3)
Sodium: 137 mEq/L (ref 135–145)
Total Bilirubin: 0.3 mg/dL (ref 0.3–1.2)
Total Protein: 6.8 g/dL (ref 6.0–8.3)

## 2013-07-23 LAB — HEMOGLOBIN A1C
Hgb A1c MFr Bld: 6.5 % — ABNORMAL HIGH (ref <5.7)
Mean Plasma Glucose: 140 mg/dL — ABNORMAL HIGH (ref <117)

## 2013-07-24 ENCOUNTER — Telehealth: Payer: Self-pay | Admitting: Family Medicine

## 2013-07-24 ENCOUNTER — Other Ambulatory Visit: Payer: Self-pay | Admitting: Family Medicine

## 2013-07-24 NOTE — Telephone Encounter (Signed)
Patient is aware 

## 2013-07-25 NOTE — Assessment & Plan Note (Signed)
Controlled, no change in medication Patient advised to reduce carb and sweets, commit to regular physical activity, take meds as prescribed, test blood as directed, and attempt to lose weight, to improve blood sugar control.  

## 2013-07-25 NOTE — Assessment & Plan Note (Addendum)
Deteriorating, 10 pound weight gain since last visit. Refer to bariatric surgeon. Start daily phentermine Commit to regular exercise and dietary chnage

## 2013-07-25 NOTE — Assessment & Plan Note (Signed)
Uncontrolled, pt to commit to daily use of medication. Cough suppressant for bedtime use prescribed. CXR past due and needs to be done

## 2013-07-25 NOTE — Assessment & Plan Note (Signed)
Wants to quit but unwilling to commit to a set date Patient counseled for approximately 5 minutes regarding the health risks of ongoing nicotine use, specifically all types of cancer, heart disease, stroke and respiratory failure. The options available for help with cessation ,the behavioral changes to assist the process, and the option to either gradully reduce usage  Or abruptly stop.is also discussed. Pt is also encouraged to set specific goals in number of cigarettes used daily, as well as to set a quit date.

## 2013-07-25 NOTE — Assessment & Plan Note (Signed)
Increased exertional dyspnea in past 3 month. No associated c/o chest pain, nausea , or diaphoresis, however , pt has multiple cardiac risk factors. Office EKG shows sinus rhythm with no ischemic changes

## 2013-07-25 NOTE — Assessment & Plan Note (Signed)
Controlled, no change in medication DASH diet and commitment to daily physical activity for a minimum of 30 minutes discussed and encouraged, as a part of hypertension management. The importance of attaining a healthy weight is also discussed.  

## 2013-07-25 NOTE — Assessment & Plan Note (Signed)
Exam as documented Immunization needs addressed Counseled and engaged pt in new ways to address her ongoing challenges of nicotine addition and morbid obesity.

## 2013-07-26 ENCOUNTER — Other Ambulatory Visit: Payer: Self-pay

## 2013-07-26 MED ORDER — METRONIDAZOLE 500 MG PO TABS: ORAL_TABLET | ORAL | Status: DC

## 2013-07-26 NOTE — Addendum Note (Signed)
Addended by: Denman George B on: 07/26/2013 11:00 PM   Modules accepted: Orders

## 2013-07-29 ENCOUNTER — Telehealth: Payer: Self-pay | Admitting: Family Medicine

## 2013-07-29 NOTE — Telephone Encounter (Signed)
Spoke with patient and addressed questions of pap results.

## 2013-08-15 ENCOUNTER — Encounter: Payer: Self-pay | Admitting: Family Medicine

## 2013-09-20 ENCOUNTER — Telehealth: Payer: Self-pay | Admitting: Family Medicine

## 2013-09-20 ENCOUNTER — Other Ambulatory Visit: Payer: Self-pay

## 2013-09-20 MED ORDER — LOSARTAN POTASSIUM 25 MG PO TABS: ORAL_TABLET | ORAL | Status: DC

## 2013-09-20 NOTE — Telephone Encounter (Signed)
Called and left message for patient to call back with location of pharmacy.

## 2013-09-20 NOTE — Telephone Encounter (Signed)
Spoke with patient and rx routed to appropriate pharmacy.

## 2013-10-18 ENCOUNTER — Other Ambulatory Visit: Payer: Self-pay

## 2013-10-18 MED ORDER — VENLAFAXINE HCL ER 37.5 MG PO CP24: ORAL_CAPSULE | ORAL | Status: DC

## 2013-10-21 ENCOUNTER — Other Ambulatory Visit: Payer: Self-pay

## 2013-10-21 ENCOUNTER — Telehealth: Payer: Self-pay

## 2013-10-21 MED ORDER — VENLAFAXINE HCL ER 37.5 MG PO CP24
ORAL_CAPSULE | ORAL | Status: DC
Start: 2013-10-21 — End: 2014-08-06

## 2013-10-21 NOTE — Telephone Encounter (Signed)
Patient wants to change effexor back to 37.5 mg.  States that she is unable to tolerate 75mg  dose.  Rx sent to pharmacy.  FYI

## 2013-10-21 NOTE — Telephone Encounter (Signed)
Noted, thanks!

## 2013-11-15 ENCOUNTER — Other Ambulatory Visit: Payer: Self-pay | Admitting: Family Medicine

## 2013-11-20 ENCOUNTER — Ambulatory Visit: Payer: Managed Care, Other (non HMO) | Admitting: Family Medicine

## 2013-11-25 ENCOUNTER — Ambulatory Visit (INDEPENDENT_AMBULATORY_CARE_PROVIDER_SITE_OTHER): Payer: Managed Care, Other (non HMO) | Admitting: Family Medicine

## 2013-11-25 ENCOUNTER — Encounter: Payer: Self-pay | Admitting: Family Medicine

## 2013-11-25 VITALS — BP 120/82 | HR 100 | Resp 18 | Ht 66.5 in | Wt 330.1 lb

## 2013-11-25 DIAGNOSIS — Z6841 Body Mass Index (BMI) 40.0 and over, adult: Secondary | ICD-10-CM

## 2013-11-25 DIAGNOSIS — I10 Essential (primary) hypertension: Secondary | ICD-10-CM

## 2013-11-25 DIAGNOSIS — F17200 Nicotine dependence, unspecified, uncomplicated: Secondary | ICD-10-CM

## 2013-11-25 DIAGNOSIS — F4323 Adjustment disorder with mixed anxiety and depressed mood: Secondary | ICD-10-CM

## 2013-11-25 DIAGNOSIS — M899 Disorder of bone, unspecified: Secondary | ICD-10-CM

## 2013-11-25 DIAGNOSIS — M949 Disorder of cartilage, unspecified: Secondary | ICD-10-CM

## 2013-11-25 DIAGNOSIS — E119 Type 2 diabetes mellitus without complications: Secondary | ICD-10-CM

## 2013-11-25 DIAGNOSIS — M62838 Other muscle spasm: Secondary | ICD-10-CM | POA: Insufficient documentation

## 2013-11-25 DIAGNOSIS — J309 Allergic rhinitis, unspecified: Secondary | ICD-10-CM

## 2013-11-25 LAB — GLUCOSE, POCT (MANUAL RESULT ENTRY): POC Glucose: 138 mg/dl — AB (ref 70–99)

## 2013-11-25 MED ORDER — LORATADINE 10 MG PO TABS: 10.0000 mg | ORAL_TABLET | Freq: Every day | ORAL | Status: DC

## 2013-11-25 MED ORDER — ALPRAZOLAM 0.5 MG PO TABS: 0.5000 mg | ORAL_TABLET | Freq: Every evening | ORAL | Status: DC | PRN

## 2013-11-25 MED ORDER — PHENTERMINE HCL 37.5 MG PO TABS: 37.5000 mg | ORAL_TABLET | Freq: Every day | ORAL | Status: DC

## 2013-11-25 MED ORDER — CYCLOBENZAPRINE HCL 5 MG PO TABS: ORAL_TABLET | ORAL | Status: DC

## 2013-11-25 MED ORDER — METHYLPREDNISOLONE ACETATE 80 MG/ML IJ SUSP
80.0000 mg | Freq: Once | INTRAMUSCULAR | Status: AC
  Administered 2013-11-25: 80 mg via INTRAMUSCULAR

## 2013-11-25 NOTE — Patient Instructions (Addendum)
F/u in 4 month call if you need me before Phentermine one daily as before  Congrats on weight loss keep it up, start daily exercise for 30 minutes   Deomedrol 4m IM in office today for allergies   Flexeril at bedtime for left shoulder  spasm and daily massage  HBA1C and chem7 , tSH and EGFR and vit D today  HBA1C , and chem 7 and EGFR, and fasting lipid, cBC in 4 month, before next visit

## 2013-11-26 ENCOUNTER — Other Ambulatory Visit: Payer: Self-pay

## 2013-11-26 DIAGNOSIS — Z6841 Body Mass Index (BMI) 40.0 and over, adult: Principal | ICD-10-CM

## 2013-11-26 DIAGNOSIS — E119 Type 2 diabetes mellitus without complications: Secondary | ICD-10-CM

## 2013-11-26 LAB — COMPLETE METABOLIC PANEL WITH GFR
ALT: 10 U/L (ref 0–35)
AST: 13 U/L (ref 0–37)
Albumin: 3.9 g/dL (ref 3.5–5.2)
Alkaline Phosphatase: 78 U/L (ref 39–117)
BILIRUBIN TOTAL: 0.2 mg/dL (ref 0.2–1.2)
BUN: 11 mg/dL (ref 6–23)
CO2: 28 mEq/L (ref 19–32)
Calcium: 9.2 mg/dL (ref 8.4–10.5)
Chloride: 105 mEq/L (ref 96–112)
Creat: 0.74 mg/dL (ref 0.50–1.10)
GFR, Est African American: >89 mL/min
GLUCOSE: 94 mg/dL (ref 70–99)
Potassium: 4.1 mEq/L (ref 3.5–5.3)
SODIUM: 143 meq/L (ref 135–145)
Total Protein: 6.4 g/dL (ref 6.0–8.3)

## 2013-11-26 LAB — VITAMIN D 25 HYDROXY (VIT D DEFICIENCY, FRACTURES): Vit D, 25-Hydroxy: 36 ng/mL (ref 30–89)

## 2013-11-26 LAB — HEMOGLOBIN A1C
HEMOGLOBIN A1C: 6.8 % — AB (ref <5.7)
MEAN PLASMA GLUCOSE: 148 mg/dL — AB (ref <117)

## 2013-11-26 LAB — TSH: TSH: 1.598 u[IU]/mL (ref 0.350–4.500)

## 2013-11-26 MED ORDER — PHENTERMINE HCL 37.5 MG PO CAPS: 37.5000 mg | ORAL_CAPSULE | ORAL | Status: DC

## 2013-11-26 MED ORDER — METFORMIN HCL 500 MG PO TABS
ORAL_TABLET | ORAL | Status: DC
Start: 2013-11-26 — End: 2014-08-06

## 2013-11-26 NOTE — Assessment & Plan Note (Signed)
Not as well controlled, dose increase in metformin Pt to commit to daily exercise and weight loss through reduced caloric intake

## 2013-12-09 ENCOUNTER — Other Ambulatory Visit: Payer: Self-pay | Admitting: Family Medicine

## 2013-12-11 ENCOUNTER — Telehealth: Payer: Self-pay | Admitting: Family Medicine

## 2013-12-11 NOTE — Assessment & Plan Note (Signed)
Uncontrolled with flare of symptoms, depo medrol in office

## 2013-12-11 NOTE — Assessment & Plan Note (Signed)
Improved. Pt applauded on succesful weight loss through lifestyle change, and encouraged to continue same. Weight loss goal set for the next several months.  

## 2013-12-11 NOTE — Assessment & Plan Note (Signed)
Controlled, no change in medication DASH diet and commitment to daily physical activity for a minimum of 30 minutes discussed and encouraged, as a part of hypertension management. The importance of attaining a healthy weight is also discussed.  

## 2013-12-11 NOTE — Telephone Encounter (Signed)
Dr Moshe Cipro called. She will try to call her back

## 2013-12-11 NOTE — Assessment & Plan Note (Signed)
Continues to smoke and though wants to quit , does not feel capable of setting a quit date at this time. Patient counseled for approximately 5 minutes regarding the health risks of ongoing nicotine use, specifically all types of cancer, heart disease, stroke and respiratory failure. The options available for help with cessation ,the behavioral changes to assist the process, and the option to either gradully reduce usage  Or abruptly stop.is also discussed. Pt is also encouraged to set specific goals in number of cigarettes used daily, as well as to set a quit date.

## 2013-12-11 NOTE — Assessment & Plan Note (Signed)
Improved with effexor 

## 2013-12-11 NOTE — Assessment & Plan Note (Signed)
Flexeril for bedtime use as needed, pt also encouraged to massage neck and shoulders regularly to help with spasm

## 2013-12-11 NOTE — Progress Notes (Signed)
Subjective:    Patient ID: [name], female    DOB: 02/15/1966, 48 y.o.   MRN: [id number]  HPI The PT is here for follow up and re-evaluation of chronic medical conditions, medication management and review of any available recent lab and radiology data.  Preventive health is updated, specifically  Cancer screening and Immunization.   The PT denies any adverse reactions to current medications since the last visit.  2 day h/o headache with frontal and maxillary pressure, nasal congestion, clear drianage , due to uncontrolled allergies despite use of daily medication. Benefits from depo medrol and is requesting same today Has been successful with some weight loss , and will continue to pursue this, her insurance will not cover surgery, reportedly    Review of Systems See HPI Denies recent fever or chills. Denies , ear pain or sore throat. Denies chest congestion, productive cough or wheezing. Denies chest pains, palpitations and leg swelling Denies abdominal pain, nausea, vomiting,diarrhea or constipation.   Denies dysuria, frequency, hesitancy or incontinence. Denies joint pain, swelling and limitation in mobility. Denies , seizures, numbness, or tingling. Denies uncontrolled depression, anxiety or insomnia. Denies skin break down or rash.        Objective:   Physical Exam  BP 120/82  Pulse 100  Resp 18  Ht 5' 6.5" (1.689 m)  Wt 330 lb 1.3 oz (149.723 kg)  BMI 52.48 kg/m2  SpO2 96% Patient alert and oriented and in no cardiopulmonary distress.  HEENT: No facial asymmetry, EOMI, frontal and maxillary sinus tenderness,  oropharynx pink and moist.  Neck supple no adenopathy.Nasal mucosa erythematous and edematous, TM clear bilaterally  Chest: Clear to auscultation bilaterally.  CVS: S1, S2 no murmurs, no S3.  ABD: Soft non tender. Bowel sounds normal.  Ext: No edema  MS: Adequate ROM spine, shoulders, hips and knees.  Skin: Intact, no ulcerations or rash  noted.  Psych: Good eye contact, normal affect. Memory intact not anxious or depressed appearing.  CNS: CN 2-12 intact, power, tone and sensation normal throughout.       Assessment & Plan:  DIABETES MELLITUS, TYPE II Not as well controlled, dose increase in metformin Pt to commit to daily exercise and weight loss through reduced caloric intake  HTN, goal below 130/80 Controlled, no change in medication DASH diet and commitment to daily physical activity for a minimum of 30 minutes discussed and encouraged, as a part of hypertension management. The importance of attaining a healthy weight is also discussed.   ALLERGIC RHINITIS Uncontrolled with flare of symptoms, depo medrol in office  Morbid obesity with body mass index of 50.0-59.9 in adult Improved. Pt applauded on succesful weight loss through lifestyle change, and encouraged to continue same. Weight loss goal set for the next several months.   Neck muscle spasm Flexeril for bedtime use as needed, pt also encouraged to massage neck and shoulders regularly to help with spasm  NICOTINE ADDICTION Continues to smoke and though wants to quit , does not feel capable of setting a quit date at this time. Patient counseled for approximately 5 minutes regarding the health risks of ongoing nicotine use, specifically all types of cancer, heart disease, stroke and respiratory failure. The options available for help with cessation ,the behavioral changes to assist the process, and the option to either gradully reduce usage  Or abruptly stop.is also discussed. Pt is also encouraged to set specific goals in number of cigarettes used daily, as well as to set a quit  date.   ADJ DISORDER WITH MIXED ANXIETY & DEPRESSED MOOD Improved with effexor

## 2013-12-30 ENCOUNTER — Telehealth: Payer: Self-pay

## 2013-12-30 MED ORDER — MECLIZINE HCL 12.5 MG PO TABS: 12.5000 mg | ORAL_TABLET | Freq: Three times a day (TID) | ORAL | Status: DC | PRN

## 2013-12-30 NOTE — Telephone Encounter (Signed)
Med sent to pharmacy.  Patient aware.  

## 2013-12-30 NOTE — Telephone Encounter (Signed)
Verbal order for meclizine 12.5 mg tid prn #30

## 2014-01-08 LAB — HM DIABETES EYE EXAM

## 2014-01-11 ENCOUNTER — Other Ambulatory Visit: Payer: Self-pay | Admitting: Family Medicine

## 2014-02-26 ENCOUNTER — Telehealth: Payer: Self-pay

## 2014-02-26 DIAGNOSIS — Z139 Encounter for screening, unspecified: Secondary | ICD-10-CM

## 2014-02-26 MED ORDER — NICOTINE 21 MG/24HR TD PT24: 21.0000 mg | MEDICATED_PATCH | Freq: Every day | TRANSDERMAL | Status: AC

## 2014-02-26 MED ORDER — NICOTINE 14 MG/24HR TD PT24: 14.0000 mg | MEDICATED_PATCH | Freq: Every day | TRANSDERMAL | Status: DC

## 2014-02-26 NOTE — Telephone Encounter (Signed)
Patient aware, appt scheduled and lab order mailed

## 2014-02-26 NOTE — Telephone Encounter (Signed)
Patient wants rx for nicotine patches sent to [facility] (said her insurance would cover them with an rx) Also, patient wants me to mail her a lab order to be tested for hepatitis (A, B, C) Said her husband was diagnosed with C. Told her I would mail the lab order once the Dr approved.

## 2014-02-26 NOTE — Telephone Encounter (Signed)
Patient called back to inform that it needs to be for Nicotine 24hour patches

## 2014-02-26 NOTE — Telephone Encounter (Signed)
pls let her know 2 months have been orescribed,start with the 21 mg, dose , then after 4 weeks the 14 mg dose, I should be seeing her within the next 2 mionths  Remind No smoke while using patches. Encourage hjer to use QUIT NOW line , and also on an excellent health decision OK to order  Lab and mail Verify appropriate f/u date and labs to follow pls she is diabetic etc

## 2014-02-26 NOTE — Addendum Note (Signed)
Addended by: Eual Fines on: 02/26/2014 03:17 PM   Modules accepted: Orders

## 2014-03-03 ENCOUNTER — Other Ambulatory Visit: Payer: Self-pay | Admitting: Family Medicine

## 2014-03-04 LAB — HEPATITIS PANEL, ACUTE
HCV AB: NEGATIVE
HEP A IGM: NONREACTIVE
HEP B C IGM: NONREACTIVE
Hepatitis B Surface Ag: NEGATIVE

## 2014-03-17 ENCOUNTER — Other Ambulatory Visit: Payer: Self-pay | Admitting: Family Medicine

## 2014-03-30 ENCOUNTER — Emergency Department (HOSPITAL_BASED_OUTPATIENT_CLINIC_OR_DEPARTMENT_OTHER): Payer: Managed Care, Other (non HMO)

## 2014-03-30 ENCOUNTER — Encounter (HOSPITAL_BASED_OUTPATIENT_CLINIC_OR_DEPARTMENT_OTHER): Payer: Self-pay | Admitting: Emergency Medicine

## 2014-03-30 ENCOUNTER — Emergency Department (HOSPITAL_COMMUNITY)
Admission: EM | Admit: 2014-03-30 | Discharge: 2014-03-30 | Disposition: A | Discharge status: Home / Self Care | Payer: Managed Care, Other (non HMO)

## 2014-03-30 ENCOUNTER — Emergency Department (HOSPITAL_BASED_OUTPATIENT_CLINIC_OR_DEPARTMENT_OTHER)
Admission: EM | Admit: 2014-03-30 | Discharge: 2014-03-30 | Disposition: A | Discharge status: Home / Self Care | Payer: Managed Care, Other (non HMO) | Attending: Emergency Medicine | Admitting: Emergency Medicine

## 2014-03-30 DIAGNOSIS — Z8619 Personal history of other infectious and parasitic diseases: Secondary | ICD-10-CM | POA: Diagnosis not present

## 2014-03-30 DIAGNOSIS — Z79899 Other long term (current) drug therapy: Secondary | ICD-10-CM | POA: Diagnosis not present

## 2014-03-30 DIAGNOSIS — Z8709 Personal history of other diseases of the respiratory system: Secondary | ICD-10-CM | POA: Diagnosis not present

## 2014-03-30 DIAGNOSIS — IMO0002 Reserved for concepts with insufficient information to code with codable children: Secondary | ICD-10-CM | POA: Insufficient documentation

## 2014-03-30 DIAGNOSIS — E669 Obesity, unspecified: Secondary | ICD-10-CM | POA: Diagnosis not present

## 2014-03-30 DIAGNOSIS — M7989 Other specified soft tissue disorders: Secondary | ICD-10-CM | POA: Diagnosis not present

## 2014-03-30 DIAGNOSIS — F17200 Nicotine dependence, unspecified, uncomplicated: Secondary | ICD-10-CM | POA: Insufficient documentation

## 2014-03-30 DIAGNOSIS — I10 Essential (primary) hypertension: Secondary | ICD-10-CM | POA: Insufficient documentation

## 2014-03-30 LAB — CBC WITH DIFFERENTIAL/PLATELET
Basophils Absolute: 0.1 10*3/uL (ref 0.0–0.1)
Basophils Relative: 1 % (ref 0–1)
Eosinophils Absolute: 0.2 10*3/uL (ref 0.0–0.7)
Eosinophils Relative: 3 % (ref 0–5)
HCT: 34.6 % — ABNORMAL LOW (ref 36.0–46.0)
Hemoglobin: 11.2 g/dL — ABNORMAL LOW (ref 12.0–15.0)
Lymphocytes Relative: 36 % (ref 12–46)
Lymphs Abs: 2.4 10*3/uL (ref 0.7–4.0)
MCH: 22.5 pg — AB (ref 26.0–34.0)
MCHC: 32.4 g/dL (ref 30.0–36.0)
MCV: 69.5 fL — ABNORMAL LOW (ref 78.0–100.0)
MONO ABS: 0.6 10*3/uL (ref 0.1–1.0)
MONOS PCT: 9 % (ref 3–12)
Neutro Abs: 3.3 10*3/uL (ref 1.7–7.7)
Neutrophils Relative %: 51 % (ref 43–77)
PLATELETS: 249 10*3/uL (ref 150–400)
RBC: 4.98 MIL/uL (ref 3.87–5.11)
RDW: 14.9 % (ref 11.5–15.5)
WBC: 6.6 10*3/uL (ref 4.0–10.5)

## 2014-03-30 LAB — COMPREHENSIVE METABOLIC PANEL
ALBUMIN: 3.3 g/dL — AB (ref 3.5–5.2)
ALT: 13 U/L (ref 0–35)
AST: 19 U/L (ref 0–37)
Alkaline Phosphatase: 77 U/L (ref 39–117)
Anion gap: 13 (ref 5–15)
BUN: 11 mg/dL (ref 6–23)
CHLORIDE: 103 meq/L (ref 96–112)
CO2: 25 mEq/L (ref 19–32)
CREATININE: 0.8 mg/dL (ref 0.50–1.10)
Calcium: 9.9 mg/dL (ref 8.4–10.5)
GFR calc Af Amer: >90 mL/min (ref >90)
GFR calc non Af Amer: 86 mL/min — ABNORMAL LOW (ref >90)
Glucose, Bld: 162 mg/dL — ABNORMAL HIGH (ref 70–99)
Potassium: 3.9 mEq/L (ref 3.7–5.3)
Sodium: 141 mEq/L (ref 137–147)
Total Protein: 7.1 g/dL (ref 6.0–8.3)

## 2014-03-30 MED ORDER — ACETAMINOPHEN 500 MG PO TABS
1000.0000 mg | ORAL_TABLET | Freq: Once | ORAL | Status: AC
  Administered 2014-03-30: 1000 mg via ORAL
  Filled 2014-03-30: qty 2

## 2014-03-30 NOTE — ED Notes (Signed)
Reports bilateral lower swelling x 1 week. Worse in the past 2 nights.

## 2014-03-30 NOTE — ED Provider Notes (Signed)
CSN: [id number]     Arrival date & time 03/30/14  1544 History  This chart was scribed for Ephraim Hamburger, MD by Cathie Hoops, ED Scribe. The patient was seen in [place]. The patient's care was started at 5:43 PM.     Chief Complaint  Patient presents with  . Leg Swelling   The history is provided by the patient. No language interpreter was used.   HPI Comments: [name] is a 48 y.o. female who presents to the Emergency Department complaining of new, gradually worsening, moderate bilateral lower leg swelling onset one week ago. Pt notes she has associated right leg pain and describes it was throbbing. Pt took two tylenol last night before bed with no relief because her pain has worsened over the past two days. Pt's pain is worsened with ambulation. Pt notes it started as a knot on her right ankle. Pt reports her right leg is more swollen than her left leg. Pt denies chest pain and SOB.  PCP: Dr. Tula Nakayama  Past Medical History  Diagnosis Date  . Nicotine addiction   . Obesity   . Hypertension   . Sinusitis   . Herpes zoster complicated 12/2945    affecting right eye with blured vision   Past Surgical History  Procedure Laterality Date  . Cholecystectomy    . Btl    . Marsupraliztion of left bartholin's cyst  2004   Family History  Problem Relation Age of Onset  . Diabetes Mother   . Hypertension Mother   . Diabetes Father   . Hypertension Father   . Hypertension Sister   . Hypertension Brother   . Heart disease Sister    History  Substance Use Topics  . Smoking status: Current Every Day Smoker -- 0.30 packs/day    Types: Cigarettes  . Smokeless tobacco: Not on file  . Alcohol Use: No   OB History   Grav Para Term Preterm Abortions TAB SAB Ect Mult Living                 Review of Systems  Respiratory: Negative for shortness of breath.   Cardiovascular: Positive for leg swelling. Negative for chest pain.  All other systems reviewed and are  negative.     Allergies  Ibuprofen and Statins  Home Medications   Prior to Admission medications   Medication Sig Start Date End Date Taking? Authorizing Provider  ALPRAZolam Duanne Moron) 0.5 MG tablet Take 1 tablet (0.5 mg total) by mouth at bedtime as needed. 11/25/13   Fayrene Helper, MD  Calcium Carbonate-Vitamin D (CALCIUM + D PO) Take by mouth daily.      Historical Provider, MD  cyclobenzaprine (FLEXERIL) 5 MG tablet One tablet at bedtime as needed, for muscle spasm 11/25/13   Fayrene Helper, MD  fluticasone (FLONASE) 50 MCG/ACT nasal spray PLACE 2 SPRAYS INTO THE NOSE DAILY. 06/17/13   Fayrene Helper, MD  glucose blood test strip Once daily testing 04/01/11   Fayrene Helper, MD  loratadine (CLARITIN) 10 MG tablet Take 1 tablet (10 mg total) by mouth daily. 11/25/13   Fayrene Helper, MD  loratadine (CLARITIN) 10 MG tablet TAKE ONE TABLET BY MOUTH EVERY DAY 12/09/13   Fayrene Helper, MD  losartan (COZAAR) 25 MG tablet TAKE 1 TABLET BY MOUTH EVERY DAY 11/15/13   Fayrene Helper, MD  losartan (COZAAR) 25 MG tablet TAKE 1 TABLET BY MOUTH EVERY DAY 03/18/14   Fayrene Helper,  MD  meclizine (ANTIVERT) 12.5 MG tablet Take 1 tablet (12.5 mg total) by mouth 3 (three) times daily as needed for dizziness. 12/30/13   Fayrene Helper, MD  metFORMIN (GLUCOPHAGE) 500 MG tablet One tablet three times daily 11/26/13   Fayrene Helper, MD  nicotine (NICODERM CQ - DOSED IN MG/24 HOURS) 14 mg/24hr patch Place 1 patch (14 mg total) onto the skin daily. 03/26/14 04/23/14  Fayrene Helper, MD  ONE TOUCH LANCETS MISC by Does not apply route.      Historical Provider, MD  phentermine 37.5 MG capsule Take 1 capsule (37.5 mg total) by mouth every morning. 11/26/13   Fayrene Helper, MD  valACYclovir (VALTREX) 500 MG tablet Take 500 mg by mouth daily.    Historical Provider, MD  venlafaxine XR (EFFEXOR-XR) 37.5 MG 24 hr capsule TAKE 1 CAPSULE (37.5 MG TOTAL) BY MOUTH DAILY. 10/21/13   Fayrene Helper, MD   Triage Vitals: BP 134/79  Pulse 76  Temp(Src) 98.1 F (36.7 C) (Oral)  Resp 18  Ht 5\' 6"  (1.676 m)  Wt 325 lb (147.419 kg)  BMI 52.48 kg/m2  SpO2 98% Physical Exam  Nursing note and vitals reviewed. Constitutional: She is oriented to person, place, and time. She appears well-developed and well-nourished. No distress.  HENT:  Head: Normocephalic and atraumatic.  Neck: Neck supple.  Cardiovascular: Normal rate, regular rhythm and normal heart sounds.   Pulmonary/Chest: Effort normal and breath sounds normal. No respiratory distress.  Abdominal: Soft. She exhibits no distension. There is no tenderness.  Musculoskeletal: She exhibits edema and tenderness.  Bilateral lower extremely swelling. Mild tenderness to medial, lower right leg. No cellulitis, warmth or abscess. Right lower extremity slightly more swollen than left. DP pulses normal bilaterally.  Neurological: She is alert and oriented to person, place, and time.  Skin: Skin is warm and dry.  Psychiatric: She has a normal mood and affect. Her behavior is normal.    ED Course  Procedures (including critical care time) DIAGNOSTIC STUDIES: Oxygen Saturation is 98% on Ra, normal by my interpretation.    COORDINATION OF CARE: 5:48 PM- Patient informed of current plan for treatment and evaluation and agrees with plan at this time.  Labs Review Labs Reviewed  CBC WITH DIFFERENTIAL - Abnormal; Notable for the following:    Hemoglobin 11.2 (*)    HCT 34.6 (*)    MCV 69.5 (*)    MCH 22.5 (*)    All other components within normal limits  COMPREHENSIVE METABOLIC PANEL - Abnormal; Notable for the following:    Glucose, Bld 162 (*)    Albumin 3.3 (*)    Total Bilirubin <0.2 (*)    GFR calc non Af Amer 86 (*)    All other components within normal limits    Imaging Review US Venous Img Lower Bilateral  03/30/2014   CLINICAL DATA:  Bilateral lower extremity swelling and erythema.  EXAM: BILATERAL LOWER EXTREMITY  VENOUS DOPPLER ULTRASOUND  TECHNIQUE: Gray-scale sonography with graded compression, as well as color Doppler and duplex ultrasound were performed to evaluate the lower extremity deep venous systems from the level of the common femoral vein and including the common femoral, femoral, profunda femoral, popliteal and calf veins including the posterior tibial, peroneal and gastrocnemius veins when visible. The superficial great saphenous vein was also interrogated. Spectral Doppler was utilized to evaluate flow at rest and with distal augmentation maneuvers in the common femoral, femoral and popliteal veins.  COMPARISON:  None.  FINDINGS: RIGHT LOWER EXTREMITY  Common Femoral Vein: No evidence of thrombus. Normal compressibility, respiratory phasicity and response to augmentation.  Saphenofemoral Junction: No evidence of thrombus. Normal compressibility and flow on color Doppler imaging.  Profunda Femoral Vein: No evidence of thrombus. Normal compressibility and flow on color Doppler imaging.  Femoral Vein: No evidence of thrombus. Normal compressibility, respiratory phasicity and response to augmentation.  Popliteal Vein: No evidence of thrombus. Normal compressibility, respiratory phasicity and response to augmentation. A small cystic area seen in the popliteal fossa measuring 2.7 x 0.9 x 1.5 cm.  Calf Veins: No evidence of thrombus. Normal compressibility and flow on color Doppler imaging. A superficial varicose vein is seen in the medial right lower calf, which remains patent.  Superficial Great Saphenous Vein: No evidence of thrombus. Normal compressibility and flow on color Doppler imaging.  Venous Reflux:  None.  Other Findings:  None.  LEFT LOWER EXTREMITY  Common Femoral Vein: No evidence of thrombus. Normal compressibility, respiratory phasicity and response to augmentation.  Saphenofemoral Junction: No evidence of thrombus. Normal compressibility and flow on color Doppler imaging.  Profunda Femoral Vein: No  evidence of thrombus. Normal compressibility and flow on color Doppler imaging.  Femoral Vein: No evidence of thrombus. Normal compressibility, respiratory phasicity and response to augmentation.  Popliteal Vein: No evidence of thrombus. Normal compressibility, respiratory phasicity and response to augmentation.  Calf Veins: No evidence of thrombus. Normal compressibility and flow on color Doppler imaging.  Superficial Great Saphenous Vein: No evidence of thrombus. Normal compressibility and flow on color Doppler imaging.  Venous Reflux:  None.  Other Findings:  None.  IMPRESSION: No evidence of deep venous thrombosis in either lower extremity.  Small right popliteal fossa Baker cyst. Superficial varicose vein also noted in the medial right lower calf, which remains patent.   Electronically Signed   By: Earle Gell M.D.   On: 03/30/2014 18:46   MDM   Final diagnoses:  Leg swelling    Patient with bilateral leg swelling, right slightly worse than left. No signs of infection or pitting edema. No chest pain or dyspnea. DVT u/s is negative for DVT. Patient's workup is otherwise unremarkable. She feels comfortable with discharge and states with elevation while in ED her leg swelling has already decreased.   This chart was scribed in my presence and reviewed by me personally.   Ephraim Hamburger, MD 03/31/14 1126

## 2014-03-30 NOTE — Discharge Instructions (Signed)
Edema °Edema is an abnormal buildup of fluids in your body tissues. Edema is somewhat dependent on gravity to pull the fluid to the lowest place in your body. That makes the condition more common in the legs and thighs (lower extremities). Painless swelling of the feet and ankles is common and becomes more likely as you get older. It is also common in looser tissues, like around your eyes.  °When the affected area is squeezed, the fluid may move out of that spot and leave a dent for a few moments. This dent is called pitting.  °CAUSES  °There are many possible causes of edema. Eating too much salt and being on your feet or sitting for a long time can cause edema in your legs and ankles. Hot weather may make edema worse. Common medical causes of edema include: °· Heart failure. °· Liver disease. °· Kidney disease. °· Weak blood vessels in your legs. °· Cancer. °· An injury. °· Pregnancy. °· Some medications. °· Obesity.  °SYMPTOMS  °Edema is usually painless. Your skin may look swollen or shiny.  °DIAGNOSIS  °Your health care provider may be able to diagnose edema by asking about your medical history and doing a physical exam. You may need to have tests such as X-rays, an electrocardiogram, or blood tests to check for medical conditions that may cause edema.  °TREATMENT  °Edema treatment depends on the cause. If you have heart, liver, or kidney disease, you need the treatment appropriate for these conditions. General treatment may include: °· Elevation of the affected body part above the level of your heart. °· Compression of the affected body part. Pressure from elastic bandages or support stockings squeezes the tissues and forces fluid back into the blood vessels. This keeps fluid from entering the tissues. °· Restriction of fluid and salt intake. °· Use of a water pill (diuretic). These medications are appropriate only for some types of edema. They pull fluid out of your body and make you urinate more often. This  gets rid of fluid and reduces swelling, but diuretics can have side effects. Only use diuretics as directed by your health care provider. °HOME CARE INSTRUCTIONS  °· Keep the affected body part above the level of your heart when you are lying down.   °· Do not sit still or stand for prolonged periods.   °· Do not put anything directly under your knees when lying down. °· Do not wear constricting clothing or garters on your upper legs.   °· Exercise your legs to work the fluid back into your blood vessels. This may help the swelling go down.   °· Wear elastic bandages or support stockings to reduce ankle swelling as directed by your health care provider.   °· Eat a low-salt diet to reduce fluid if your health care provider recommends it.   °· Only take medicines as directed by your health care provider.  °SEEK MEDICAL CARE IF:  °· Your edema is not responding to treatment. °· You have heart, liver, or kidney disease and notice symptoms of edema. °· You have edema in your legs that does not improve after elevating them.   °· You have sudden and unexplained weight gain. °SEEK IMMEDIATE MEDICAL CARE IF:  °· You develop shortness of breath or chest pain.   °· You cannot breathe when you lie down. °· You develop pain, redness, or warmth in the swollen areas.   °· You have heart, liver, or kidney disease and suddenly get edema. °· You have a fever and your symptoms suddenly get worse. °MAKE SURE YOU:  °·   Understand these instructions. °· Will watch your condition. °· Will get help right away if you are not doing well or get worse. °Document Released: 07/11/2005 Document Revised: 11/25/2013 Document Reviewed: 05/03/2013 °ExitCare® Patient Information ©2015 [employer]. This information is not intended to replace advice given to you by your health care provider. Make sure you discuss any questions you have with your health care provider. ° °Peripheral Edema °You have swelling in your legs (peripheral edema). This swelling  is due to excess accumulation of salt and water in your body. Edema may be a sign of heart, kidney or liver disease, or a side effect of a medication. It may also be due to problems in the leg veins. Elevating your legs and using special support stockings may be very helpful, if the cause of the swelling is due to poor venous circulation. Avoid long periods of standing, whatever the cause. °Treatment of edema depends on identifying the cause. Chips, pretzels, pickles and other salty foods should be avoided. Restricting salt in your diet is almost always needed. Water pills (diuretics) are often used to remove the excess salt and water from your body via urine. These medicines prevent the kidney from reabsorbing sodium. This increases urine flow. °Diuretic treatment may also result in lowering of potassium levels in your body. Potassium supplements may be needed if you have to use diuretics daily. Daily weights can help you keep track of your progress in clearing your edema. You should call your caregiver for follow up care as recommended. °SEEK IMMEDIATE MEDICAL CARE IF:  °· You have increased swelling, pain, redness, or heat in your legs. °· You develop shortness of breath, especially when lying down. °· You develop chest or abdominal pain, weakness, or fainting. °· You have a fever. °Document Released: 08/18/2004 Document Revised: 10/03/2011 Document Reviewed: 07/29/2009 °ExitCare® Patient Information ©2015 [employer]. This information is not intended to replace advice given to you by your health care provider. Make sure you discuss any questions you have with your health care provider. ° °

## 2014-03-30 NOTE — ED Notes (Signed)
Verbal orders received from MD [facility]

## 2014-04-01 ENCOUNTER — Telehealth: Payer: Self-pay | Admitting: Family Medicine

## 2014-04-01 NOTE — Telephone Encounter (Signed)
Lab order and letter requesting that they be drawn as soon as possible sent to patient.

## 2014-04-02 ENCOUNTER — Ambulatory Visit (INDEPENDENT_AMBULATORY_CARE_PROVIDER_SITE_OTHER): Payer: Managed Care, Other (non HMO) | Admitting: Family Medicine

## 2014-04-02 ENCOUNTER — Telehealth: Payer: Self-pay | Admitting: Family Medicine

## 2014-04-02 ENCOUNTER — Encounter: Payer: Self-pay | Admitting: Family Medicine

## 2014-04-02 VITALS — BP 130/70 | HR 70 | Resp 14 | Ht 66.5 in | Wt 325.0 lb

## 2014-04-02 DIAGNOSIS — I8390 Asymptomatic varicose veins of unspecified lower extremity: Secondary | ICD-10-CM | POA: Insufficient documentation

## 2014-04-02 DIAGNOSIS — E119 Type 2 diabetes mellitus without complications: Secondary | ICD-10-CM

## 2014-04-02 DIAGNOSIS — I10 Essential (primary) hypertension: Secondary | ICD-10-CM

## 2014-04-02 DIAGNOSIS — B351 Tinea unguium: Secondary | ICD-10-CM

## 2014-04-02 DIAGNOSIS — F4323 Adjustment disorder with mixed anxiety and depressed mood: Secondary | ICD-10-CM

## 2014-04-02 DIAGNOSIS — Z6841 Body Mass Index (BMI) 40.0 and over, adult: Secondary | ICD-10-CM

## 2014-04-02 DIAGNOSIS — Z79899 Other long term (current) drug therapy: Secondary | ICD-10-CM

## 2014-04-02 DIAGNOSIS — N951 Menopausal and female climacteric states: Secondary | ICD-10-CM

## 2014-04-02 DIAGNOSIS — M7989 Other specified soft tissue disorders: Secondary | ICD-10-CM

## 2014-04-02 DIAGNOSIS — R232 Flushing: Secondary | ICD-10-CM

## 2014-04-02 LAB — LIPID PANEL
Cholesterol: 152 mg/dL (ref 0–200)
HDL: 54 mg/dL (ref >39)
LDL CALC: 70 mg/dL (ref 0–99)
Total CHOL/HDL Ratio: 2.8 Ratio
Triglycerides: 140 mg/dL (ref <150)
VLDL: 28 mg/dL (ref 0–40)

## 2014-04-02 LAB — HEMOGLOBIN A1C
HEMOGLOBIN A1C: 7.1 % — AB (ref <5.7)
Mean Plasma Glucose: 157 mg/dL — ABNORMAL HIGH (ref <117)

## 2014-04-02 MED ORDER — NICOTINE 14 MG/24HR TD PT24: 14.0000 mg | MEDICATED_PATCH | Freq: Every day | TRANSDERMAL | Status: AC

## 2014-04-02 MED ORDER — POTASSIUM CHLORIDE CRYS ER 20 MEQ PO TBCR: 20.0000 meq | EXTENDED_RELEASE_TABLET | Freq: Every day | ORAL | Status: DC

## 2014-04-02 MED ORDER — FLUTICASONE PROPIONATE 50 MCG/ACT NA SUSP: NASAL | Status: DC

## 2014-04-02 MED ORDER — FUROSEMIDE 20 MG PO TABS: 20.0000 mg | ORAL_TABLET | Freq: Every day | ORAL | Status: DC

## 2014-04-02 MED ORDER — ALPRAZOLAM 0.5 MG PO TABS: 0.5000 mg | ORAL_TABLET | Freq: Every evening | ORAL | Status: DC | PRN

## 2014-04-02 NOTE — Assessment & Plan Note (Signed)
Ready for surgery, will refer for consultation

## 2014-04-02 NOTE — Telephone Encounter (Signed)
Will mail letter to patient once signed

## 2014-04-02 NOTE — Patient Instructions (Signed)
F/u in 3.5 month, call if you need me before  Lasix and potassium once daily to help with leg swelling  Knee high compression hose for right leg where  You have a varicose vein   You are referred for consultation with Doctor for surgery for weight loss.  Congrats on smoking cessation.  Labs today HBA1C, and lipid fasting  Try tablet for fungal nail infection again  Lab in 3.5 month, non fast hBA1C, cmp and EGFR

## 2014-04-06 NOTE — Assessment & Plan Note (Signed)
Svere, needs treatment, states she was nauseated when she took medication in the past , will try again

## 2014-04-06 NOTE — Assessment & Plan Note (Signed)
Improved mentally continue xanax at bedtime, as needed, for sleep

## 2014-04-06 NOTE — Assessment & Plan Note (Signed)
deteriorated . Medication compliance stressed, has  Been under dosing the metformin prescribed Patient advised to reduce carb and sweets, commit to regular physical activity, take meds as prescribed, test blood as directed, and attempt to lose weight, to improve blood sugar control.

## 2014-04-06 NOTE — Assessment & Plan Note (Addendum)
Script for compression hose provided to assist with this, also lasix and potassium for daily use as needed

## 2014-04-06 NOTE — Progress Notes (Signed)
   Subjective:    Patient ID: [name], female    DOB: Mar 20, 1966, 48 y.o.   MRN: [id number]  HPI The PT is here for follow up and re-evaluation of chronic medical conditions, medication management and review of any available recent lab and radiology data.  Preventive health is updated, specifically  Cancer screening and Immunization.   Recently seen in ED for bilateral leg swelling, right worse than left, in  For f/u. States she had to abort recent trip to [place] due to leg swelling. Denies PND, orthopnea or new chest pain. Venous doppler done in ED shows distended  superficial varicose vein The PT denies any adverse reactions to current medications since the last visit.  Spouse recently diagnosed with hepatitis , her titers are negative likely needs to be immunized Has quit nicotine, really wants gastric bypass ands hopes to  Be able to have this she has ben trying for weight loss with no ral success,ultiple co morbidities    Review of Systems See HPI Denies recent fever or chills. Denies sinus pressure, nasal congestion, ear pain or sore throat. Denies chest congestion, productive cough or wheezing. Denies abdominal pain, nausea, vomiting,diarrhea or constipation.   Denies dysuria, frequency, hesitancy or incontinence. Denies joint pain, swelling and limitation in mobility. Denies headaches, seizures, numbness, or tingling. Denies depression, anxiety or insomnia. Denies skin break down or rash.        Objective:   Physical Exam BP 130/70  Pulse 70  Resp 14  Ht 5' 6.5" (1.689 m)  Wt 325 lb (147.419 kg)  BMI 51.68 kg/m2  SpO2 98% Patient alert and oriented and in no cardiopulmonary distress.  HEENT: No facial asymmetry, EOMI,   oropharynx pink and moist.  Neck supple no JVD, no mass.  Chest: Clear to auscultation bilaterally.  CVS: S1, S2 no murmurs, no S3.Regular rate.  ABD: Soft non tender.   Ext: one plus pitting  Edema, right more than left. [place]  swelling in lower third of right leg where there is distended superficial vein  MS: Adequate ROM spine, shoulders, hips and knees.  Skin: Intact, no ulcerations or rash noted.  Psych: Good eye contact, normal affect. Memory intact not anxious or depressed appearing.  CNS: CN 2-12 intact, power,  normal throughout.no focal deficits noted.        Assessment & Plan:  Morbid obesity with body mass index of 50.0-59.9 in adult Ready for surgery, will refer for consultation  Varicose vein Script for compression hose provided to assist with this, also lasix and potassium for daily use as needed  DIABETES MELLITUS, TYPE II deteriorated . Medication compliance stressed, has  Been under dosing the metformin prescribed Patient advised to reduce carb and sweets, commit to regular physical activity, take meds as prescribed, test blood as directed, and attempt to lose weight, to improve blood sugar control.   Onychomycosis Svere, needs treatment, states she was nauseated when she took medication in the past , will try again  Leg swelling Start daily lasix and potassium, reduce sodium intake and elevate legs when able  Hot flashes Improved with effexor, continue same  ADJ DISORDER WITH MIXED ANXIETY & DEPRESSED MOOD Improved mentally continue xanax at bedtime, as needed, for sleep

## 2014-04-06 NOTE — Assessment & Plan Note (Signed)
Improved with effexor , continue same 

## 2014-04-06 NOTE — Assessment & Plan Note (Signed)
Start daily lasix and potassium, reduce sodium intake and elevate legs when able

## 2014-04-08 ENCOUNTER — Ambulatory Visit: Payer: Managed Care, Other (non HMO) | Admitting: Family Medicine

## 2014-04-10 NOTE — Addendum Note (Signed)
Addended by: Eual Fines on: 04/10/2014 09:37 AM   Modules accepted: Orders

## 2014-04-23 ENCOUNTER — Ambulatory Visit: Payer: Managed Care, Other (non HMO) | Admitting: Family Medicine

## 2014-05-08 ENCOUNTER — Telehealth: Payer: Self-pay

## 2014-05-08 NOTE — Telephone Encounter (Addendum)
Sounds as if she is having loca reaction to injection, there is no report here of drainage or pain I suggest benadryl tablets OTC one to 2 daily for acute inflammatory response. If has pain and purulent drainage will need eval, and will need to go to urgent care for a Provider to see it. If she feels it is that bad and wants to come in this pm that is OK as well (no work in for this next week is available)

## 2014-05-08 NOTE — Telephone Encounter (Signed)
Patient aware and states that she is not having any drainage from site.  Patient also taken out work by [employer].

## 2014-05-09 ENCOUNTER — Telehealth: Payer: Self-pay

## 2014-05-09 NOTE — Telephone Encounter (Signed)
Ok to give a work note to cover up to 05/12/2014

## 2014-05-09 NOTE — Telephone Encounter (Signed)
Note printed and sent to fax #.

## 2014-05-17 ENCOUNTER — Other Ambulatory Visit: Payer: Self-pay | Admitting: Family Medicine

## 2014-06-02 ENCOUNTER — Other Ambulatory Visit: Payer: Self-pay | Admitting: Family Medicine

## 2014-06-24 ENCOUNTER — Other Ambulatory Visit: Payer: Self-pay

## 2014-06-24 ENCOUNTER — Telehealth: Payer: Self-pay | Admitting: *Deleted

## 2014-06-24 MED ORDER — FLUCONAZOLE 150 MG PO TABS: 150.0000 mg | ORAL_TABLET | Freq: Once | ORAL | Status: DC

## 2014-06-24 MED ORDER — AZITHROMYCIN 250 MG PO TABS: ORAL_TABLET | ORAL | Status: DC

## 2014-06-24 NOTE — Telephone Encounter (Signed)
States x 3 days, nasal congestion, thick green, pain and pressure on left side of face and in head. Taking tylenol and nasal spray- not helping. Offered appt this PM but she is in [place].. Wants something called in to [place]. Please advise

## 2014-06-24 NOTE — Telephone Encounter (Signed)
Pt called requesting something for a sinus infection Please advise

## 2014-06-24 NOTE — Telephone Encounter (Signed)
Med sent and patient aware 

## 2014-06-24 NOTE — Telephone Encounter (Addendum)
pls send z pack x 1, ex[plain if worsens , will need to be seen here  if available or at [facility] . Explain no work excuse from here will be available without OV, also let her know that policy is no antibiotic withoput ov so theis is an EXCEPTION (one time in next 12 months!) Send fluconazole #1 if she needs also pls

## 2014-06-29 ENCOUNTER — Other Ambulatory Visit: Payer: Self-pay | Admitting: Family Medicine

## 2014-08-02 ENCOUNTER — Other Ambulatory Visit: Payer: Self-pay | Admitting: Family Medicine

## 2014-08-02 LAB — BASIC METABOLIC PANEL WITH GFR
BUN: 11 mg/dL (ref 6–23)
CO2: 28 mEq/L (ref 19–32)
CREATININE: 0.78 mg/dL (ref 0.50–1.10)
Calcium: 9.2 mg/dL (ref 8.4–10.5)
Chloride: 103 mEq/L (ref 96–112)
GFR, Est Non African American: >89 mL/min
Glucose, Bld: 93 mg/dL (ref 70–99)
POTASSIUM: 4.6 meq/L (ref 3.5–5.3)
Sodium: 139 mEq/L (ref 135–145)

## 2014-08-02 LAB — CBC WITH DIFFERENTIAL/PLATELET
BASOS PCT: 1 % (ref 0–1)
Basophils Absolute: 0.1 10*3/uL (ref 0.0–0.1)
EOS PCT: 3 % (ref 0–5)
Eosinophils Absolute: 0.2 10*3/uL (ref 0.0–0.7)
HCT: 40.9 % (ref 36.0–46.0)
HEMOGLOBIN: 12.3 g/dL (ref 12.0–15.0)
LYMPHS PCT: 41 % (ref 12–46)
Lymphs Abs: 2.2 10*3/uL (ref 0.7–4.0)
MCH: 22.1 pg — ABNORMAL LOW (ref 26.0–34.0)
MCHC: 30.1 g/dL (ref 30.0–36.0)
MCV: 73.6 fL — AB (ref 78.0–100.0)
Monocytes Absolute: 0.5 10*3/uL (ref 0.1–1.0)
Monocytes Relative: 9 % (ref 3–12)
NEUTROS PCT: 46 % (ref 43–77)
Neutro Abs: 2.5 10*3/uL (ref 1.7–7.7)
PLATELETS: 267 10*3/uL (ref 150–400)
RBC: 5.56 MIL/uL — ABNORMAL HIGH (ref 3.87–5.11)
RDW: 15.1 % (ref 11.5–15.5)
WBC: 5.4 10*3/uL (ref 4.0–10.5)

## 2014-08-03 LAB — MICROALBUMIN / CREATININE URINE RATIO
Creatinine, Urine: 122.4 mg/dL
Microalb Creat Ratio: 4.9 mg/g (ref 0.0–30.0)
Microalb, Ur: 0.6 mg/dL (ref <2.0)

## 2014-08-03 LAB — HEMOGLOBIN A1C
HEMOGLOBIN A1C: 6.5 % — AB (ref <5.7)
Mean Plasma Glucose: 140 mg/dL — ABNORMAL HIGH (ref <117)

## 2014-08-06 ENCOUNTER — Ambulatory Visit: Payer: Managed Care, Other (non HMO) | Admitting: Family Medicine

## 2014-08-06 ENCOUNTER — Encounter: Payer: Self-pay | Admitting: Family Medicine

## 2014-08-06 ENCOUNTER — Ambulatory Visit (INDEPENDENT_AMBULATORY_CARE_PROVIDER_SITE_OTHER): Payer: Managed Care, Other (non HMO) | Admitting: Family Medicine

## 2014-08-06 VITALS — BP 120/78 | HR 96 | Resp 16 | Ht 67.0 in | Wt 358.0 lb

## 2014-08-06 DIAGNOSIS — J302 Other seasonal allergic rhinitis: Secondary | ICD-10-CM

## 2014-08-06 DIAGNOSIS — M1990 Unspecified osteoarthritis, unspecified site: Secondary | ICD-10-CM

## 2014-08-06 DIAGNOSIS — Z6841 Body Mass Index (BMI) 40.0 and over, adult: Secondary | ICD-10-CM

## 2014-08-06 DIAGNOSIS — I10 Essential (primary) hypertension: Secondary | ICD-10-CM

## 2014-08-06 DIAGNOSIS — E119 Type 2 diabetes mellitus without complications: Secondary | ICD-10-CM

## 2014-08-06 DIAGNOSIS — N951 Menopausal and female climacteric states: Secondary | ICD-10-CM

## 2014-08-06 DIAGNOSIS — R232 Flushing: Secondary | ICD-10-CM

## 2014-08-06 DIAGNOSIS — F4323 Adjustment disorder with mixed anxiety and depressed mood: Secondary | ICD-10-CM

## 2014-08-06 MED ORDER — METHYLPREDNISOLONE ACETATE 80 MG/ML IJ SUSP
80.0000 mg | Freq: Once | INTRAMUSCULAR | Status: AC
Start: 2014-08-06 — End: 2014-08-06
  Administered 2014-08-06: 80 mg via INTRAMUSCULAR

## 2014-08-06 MED ORDER — PHENTERMINE HCL 37.5 MG PO TABS: 37.5000 mg | ORAL_TABLET | Freq: Every day | ORAL | Status: DC

## 2014-08-06 MED ORDER — PREDNISONE 5 MG PO TABS: 5.0000 mg | ORAL_TABLET | Freq: Two times a day (BID) | ORAL | Status: AC

## 2014-08-06 MED ORDER — ALPRAZOLAM 0.5 MG PO TABS: 0.5000 mg | ORAL_TABLET | Freq: Every evening | ORAL | Status: DC | PRN

## 2014-08-06 MED ORDER — FLUTICASONE PROPIONATE 50 MCG/ACT NA SUSP: NASAL | Status: DC

## 2014-08-06 MED ORDER — VENLAFAXINE HCL ER 37.5 MG PO CP24: ORAL_CAPSULE | ORAL | Status: DC

## 2014-08-06 NOTE — Progress Notes (Signed)
Subjective:    Patient ID: [name], female    DOB: 02/17/1966, 49 y.o.   MRN: [id number]  HPI The PT is here for follow up and re-evaluation of chronic medical conditions, medication management and review of any available recent lab and radiology data.  Preventive health is updated, specifically  Cancer screening and Immunization.   Questions or concerns regarding consultations or procedures which the PT has had in the interim are  addressed. The PT denies any adverse reactions to current medications since the last visit.  C/o increased and uncontrolled  Joint pains, states tylenol is ineffectitive C/o increased stress and anxiety with ongoing weight gain and poor health of her spouse, willing tro seek therapy trough her job which she needs Denies polyuria, polydipsia, blurred vision , or hypoglycemic episodes.Tests daily Increased head congestion, stuffy nose and clear drainage, no fever or chills        Review of Systems    See HPI Denies recent fever or chills. Denies  ear pain or sore throat. C/o non productive cough denies  wheezing. Denies chest pains, palpitations and leg swelling Denies abdominal pain, nausea, vomiting,diarrhea or constipation.   Denies dysuria, frequency, hesitancy or incontinence.  Denies headaches, seizures, numbness, or tingling. . Denies skin break down or rash.      Objective:   Physical Exam  BP 120/78 mmHg  Pulse 96  Resp 16  Ht 5\' 7"  (1.702 m)  Wt 358 lb (162.388 kg)  BMI 56.06 kg/m2  SpO2 98%  Patient alert and oriented and in no cardiopulmonary distress.  HEENT: No facial asymmetry, EOMI,   oropharynx pink and moist.  Neck supple no JVD, no mass. Erythema and edema of nasal mucosa, TM clear bilaterally, bilateral watering and mildly erythematous conjunctiva, no sinus tenderness Chest: Clear to auscultation bilaterally.  CVS: S1, S2 no murmurs, no S3.Regular rate.  ABD: Soft non tender.   Ext: No edema  MS:  Adequate, though reduced  ROM spine, shoulders, hips and knees.  Skin: Intact, no ulcerations or rash noted.  Psych: Good eye contact, normal affect. Memory intact , mildly anxious tearful and  depressed appearing.  CNS: CN 2-12 intact, power,  normal throughout.no focal deficits noted.       Assessment & Plan:  HTN, goal below 130/80 Controlled, no change in medication DASH diet and commitment to daily physical activity for a minimum of 30 minutes discussed and encouraged, as a part of hypertension management. The importance of attaining a healthy weight is also discussed.    Seasonal allergies Uncontrolled, depo medrol in office and meds prescribed   Diabetes mellitus without complication Controlled, no change in medication Patient advised to reduce carb and sweets, commit to regular physical activity, take meds as prescribed, test blood as directed, and attempt to lose weight, to improve blood sugar control. Updated lab needed at/ before next visit.    ADJ DISORDER WITH MIXED ANXIETY & DEPRESSED MOOD Deteriorated, resume effexor and pt to seek therapy through her job, which is available, not suicidal or homicidal, weight gain is added stressor Personal stress, spouse dx with cirrhosis   Morbid obesity with body mass index of 50.0-59.9 in adult Deteriorated. Patient re-educated about  the importance of commitment to a  minimum of 150 minutes of exercise per week. The importance of healthy food choices with portion control discussed. Encouraged to start a food diary, count calories and to consider  joining a support group. Sample diet sheets offered. Goals set by  the patient for the next several months.      Hot flashes Improved, but will resume effexor for hot flashes as well as GAD   Arthritis C/o increased and generalized joint pains, short steroid course and depo medrol IM

## 2014-08-06 NOTE — Patient Instructions (Addendum)
F/u in 3.5 month, call if you need me before  Congrats on improved blood sugar and smoking cessation for the past 5 months  Medication sent in for arthritis as well as injection in the office   Resume phentermine half tablet twice daily  Resume effexor one daily for depression and anxiety and to help with hot flashes  PLEASE schedule an appt with therapist through your job  NEED to sTOP sweet tea and sweets  HBa1C, chem 7 and EGFR and TSH in 3.5 month  Weight loss goal of 5 pounds per month

## 2014-08-10 DIAGNOSIS — M1990 Unspecified osteoarthritis, unspecified site: Secondary | ICD-10-CM | POA: Insufficient documentation

## 2014-08-10 NOTE — Assessment & Plan Note (Signed)
Deteriorated. Patient re-educated about  the importance of commitment to a  minimum of 150 minutes of exercise per week. The importance of healthy food choices with portion control discussed. Encouraged to start a food diary, count calories and to consider  joining a support group. Sample diet sheets offered. Goals set by the patient for the next several months.    

## 2014-08-10 NOTE — Assessment & Plan Note (Signed)
Controlled, no change in medication Patient advised to reduce carb and sweets, commit to regular physical activity, take meds as prescribed, test blood as directed, and attempt to lose weight, to improve blood sugar control. Updated lab needed at/ before next visit.  

## 2014-08-10 NOTE — Assessment & Plan Note (Signed)
Controlled, no change in medication DASH diet and commitment to daily physical activity for a minimum of 30 minutes discussed and encouraged, as a part of hypertension management. The importance of attaining a healthy weight is also discussed.  

## 2014-08-10 NOTE — Assessment & Plan Note (Signed)
Deteriorated, resume effexor and pt to seek therapy through her job, which is available, not suicidal or homicidal, weight gain is added stressor Personal stress, spouse dx with cirrhosis

## 2014-08-10 NOTE — Assessment & Plan Note (Signed)
Uncontrolled, depo medrol in office and meds prescribed

## 2014-08-10 NOTE — Assessment & Plan Note (Signed)
Improved, but will resume effexor for hot flashes as well as GAD

## 2014-08-10 NOTE — Assessment & Plan Note (Addendum)
C/o increased and generalized joint pains, short steroid course and depo medrol IM

## 2014-08-23 ENCOUNTER — Other Ambulatory Visit: Payer: Self-pay | Admitting: Family Medicine

## 2014-09-14 ENCOUNTER — Other Ambulatory Visit: Payer: Self-pay | Admitting: Family Medicine

## 2014-10-27 ENCOUNTER — Ambulatory Visit (INDEPENDENT_AMBULATORY_CARE_PROVIDER_SITE_OTHER): Payer: Managed Care, Other (non HMO) | Admitting: Family Medicine

## 2014-10-27 ENCOUNTER — Encounter: Payer: Self-pay | Admitting: Family Medicine

## 2014-10-27 VITALS — BP 126/82 | HR 93 | Resp 18 | Ht 67.0 in | Wt 362.1 lb

## 2014-10-27 DIAGNOSIS — G479 Sleep disorder, unspecified: Secondary | ICD-10-CM

## 2014-10-27 DIAGNOSIS — K219 Gastro-esophageal reflux disease without esophagitis: Secondary | ICD-10-CM

## 2014-10-27 DIAGNOSIS — M545 Low back pain: Secondary | ICD-10-CM

## 2014-10-27 DIAGNOSIS — F5105 Insomnia due to other mental disorder: Secondary | ICD-10-CM

## 2014-10-27 DIAGNOSIS — N951 Menopausal and female climacteric states: Secondary | ICD-10-CM

## 2014-10-27 DIAGNOSIS — J302 Other seasonal allergic rhinitis: Secondary | ICD-10-CM

## 2014-10-27 DIAGNOSIS — J4520 Mild intermittent asthma, uncomplicated: Secondary | ICD-10-CM

## 2014-10-27 DIAGNOSIS — R232 Flushing: Secondary | ICD-10-CM

## 2014-10-27 DIAGNOSIS — R5382 Chronic fatigue, unspecified: Secondary | ICD-10-CM

## 2014-10-27 DIAGNOSIS — M5440 Lumbago with sciatica, unspecified side: Secondary | ICD-10-CM

## 2014-10-27 DIAGNOSIS — R0609 Other forms of dyspnea: Secondary | ICD-10-CM

## 2014-10-27 DIAGNOSIS — F419 Anxiety disorder, unspecified: Secondary | ICD-10-CM

## 2014-10-27 DIAGNOSIS — I10 Essential (primary) hypertension: Secondary | ICD-10-CM | POA: Diagnosis not present

## 2014-10-27 DIAGNOSIS — Z6841 Body Mass Index (BMI) 40.0 and over, adult: Secondary | ICD-10-CM

## 2014-10-27 DIAGNOSIS — E119 Type 2 diabetes mellitus without complications: Secondary | ICD-10-CM | POA: Diagnosis not present

## 2014-10-27 DIAGNOSIS — Z87891 Personal history of nicotine dependence: Secondary | ICD-10-CM

## 2014-10-27 DIAGNOSIS — F064 Anxiety disorder due to known physiological condition: Secondary | ICD-10-CM

## 2014-10-27 LAB — POCT URINALYSIS DIPSTICK
Bilirubin, UA: NEGATIVE
Blood, UA: NEGATIVE
Glucose, UA: NEGATIVE
Ketones, UA: NEGATIVE
Leukocytes, UA: NEGATIVE
NITRITE UA: NEGATIVE
PH UA: 6
PROTEIN UA: NEGATIVE
Spec Grav, UA: 1.025
Urobilinogen, UA: 0.2

## 2014-10-27 MED ORDER — ALBUTEROL SULFATE HFA 108 (90 BASE) MCG/ACT IN AERS: 2.0000 | INHALATION_SPRAY | Freq: Four times a day (QID) | RESPIRATORY_TRACT | Status: DC | PRN

## 2014-10-27 MED ORDER — METHYLPREDNISOLONE ACETATE 80 MG/ML IJ SUSP
120.0000 mg | Freq: Once | INTRAMUSCULAR | Status: AC
  Administered 2014-10-27: 120 mg via INTRAMUSCULAR

## 2014-10-27 NOTE — Patient Instructions (Addendum)
F/u in 3 month, call if you need me before  Depo medrol 120 mg tday for back pain radiating to left groin , which I believe is from a pinched nerve, also 5 day course of prednisone prescribed. Muscle relaxant at bedtime if needed   You are referred to [facility] cardiology and also ot lung specialist  Belviq is new med to help with weight  Loss, twice daily, need to control eating   Urine test shows no infection  NO PHENTERMINE until heart and lungs are fully evaluated  HBA1C, fasting lipid, cmp and EGFR today  Pls sign for recent CXR and EKG to be sent  Allbuterol inhaler is prescribed, and also omeprazole  CONGRATS  on remaining nicotine free

## 2014-10-29 ENCOUNTER — Telehealth: Payer: Self-pay | Admitting: Family Medicine

## 2014-10-30 NOTE — Telephone Encounter (Signed)
Called and left message for patient to return call.  Will need to verify if patient did receive free trial.

## 2014-10-30 NOTE — Telephone Encounter (Signed)
Patient states that she has already received the free trial.  Her insurance will not pay for the med at all.  Will check formulary

## 2014-10-30 NOTE — Telephone Encounter (Signed)
Can we try to do a PA on the belviq, she is currently being sent for evaluation for CAD, and lung disease , so phenterminwe is not a safe option at this time  Pls see if her ins has any appetite suppressant that they will approve if able, qysmia will not be an option either

## 2014-10-30 NOTE — Telephone Encounter (Signed)
Just want to clarify that there are no further options.

## 2014-11-02 DIAGNOSIS — M545 Low back pain, unspecified: Secondary | ICD-10-CM | POA: Insufficient documentation

## 2014-11-02 DIAGNOSIS — J4520 Mild intermittent asthma, uncomplicated: Secondary | ICD-10-CM | POA: Insufficient documentation

## 2014-11-02 DIAGNOSIS — G4700 Insomnia, unspecified: Secondary | ICD-10-CM | POA: Insufficient documentation

## 2014-11-02 MED ORDER — OMEPRAZOLE 40 MG PO CPDR: 40.0000 mg | DELAYED_RELEASE_CAPSULE | Freq: Every day | ORAL | Status: DC

## 2014-11-02 NOTE — Assessment & Plan Note (Signed)
Controlled, no change in medication DASH diet and commitment to daily physical activity for a minimum of 30 minutes discussed and encouraged, as a part of hypertension management. The importance of attaining a healthy weight is also discussed.  BP/Weight 10/27/2014 08/06/2014 04/02/2014 03/30/2014 11/25/2013 07/22/2013 3/66/4403  Systolic BP 474 259 563 875 643 329 518  Diastolic BP 82 78 70 79 82 82 82  Wt. (Lbs) 362.12 358 325 325 330.08 338 331.08  BMI 56.7 56.06 51.68 52.48 52.48 53.74 52.64

## 2014-11-02 NOTE — Assessment & Plan Note (Signed)
Deteriorated. Patient re-educated about  the importance of commitment to a  minimum of 150 minutes of exercise per week.  The importance of healthy food choices with portion control discussed. Encouraged to start a food diary, count calories and to consider  joining a support group. Sample diet sheets offered. Goals set by the patient for the next several months. Though patient would greatly benefit from bariatric surgery, and needs this, states repeatedly that her insurance will not cover this will need to try belviq as a weight loss medication as she has progressive dyspnea as a complaint which needs cardiac and pulmonary evaluation   Weight /BMI 10/27/2014 08/06/2014 04/02/2014  WEIGHT 362 lb 1.9 oz 358 lb 325 lb  HEIGHT 5\' 7"  5\' 7"  5' 6.5"  BMI 56.7 kg/m2 56.06 kg/m2 51.68 kg/m2    Current exercise per week 40 minutes.

## 2014-11-02 NOTE — Assessment & Plan Note (Signed)
Dicussed the need for daily use of medication for improved control, states she has meds at home

## 2014-11-02 NOTE — Assessment & Plan Note (Signed)
Sleep hygiene reviewed and written information offered also. Prescription sent for  medication needed. Low dose xanax at bedtime prescribed

## 2014-11-02 NOTE — Assessment & Plan Note (Signed)
3 week history of increased and uncontrolled pain Uncontrolled.  Depo medrol administered IM in the office, to be followed by a short course of oral prednisone. Bedtime muscle relaxant also prescribed

## 2014-11-02 NOTE — Assessment & Plan Note (Signed)
Albuterol prescribed for as needed use, and importance of controlling upper airway allergies is discussed

## 2014-11-02 NOTE — Assessment & Plan Note (Signed)
Progressive exertional fatigue with poor exercise tolerance despite nicotine cessation x 8 months. Ongoing weight gain, multiple risk factors for CAD. Needs both cardiology and pulmonary eval, will refer for both

## 2014-11-02 NOTE — Assessment & Plan Note (Signed)
Controlled on omeprazole medication  sent

## 2014-11-02 NOTE — Assessment & Plan Note (Signed)
Controlled, no change in medication Patient advised to reduce carb and sweets, commit to regular physical activity, take meds as prescribed, test blood as directed, and attempt to lose weight, to improve blood sugar control. Patient educated about the importance of limiting  Carbohydrate intake , the need to commit to daily physical activity for a minimum of 30 minutes , and to commit weight loss. The fact that changes in all these areas will reduce or eliminate all together the development of diabetes is stressed.   Diabetic Labs Latest Ref Rng 08/02/2014 04/02/2014 03/30/2014 11/25/2013 07/22/2013  HbA1c <5.7 % 6.5(H) 7.1(H) - 6.8(H) 6.5(H)  Microalbumin <2.0 mg/dL 0.6 - - - 1.51  Micro/Creat Ratio 0.0 - 30.0 mg/g 4.9 - - - 7.3  Chol 0 - 200 mg/dL - 152 - - -  HDL >39 mg/dL - 54 - - -  Calc LDL 0 - 99 mg/dL - 70 - - -  Triglycerides <150 mg/dL - 140 - - -  Creatinine 0.50 - 1.10 mg/dL 0.78 - 0.80 0.74 0.77   BP/Weight 10/27/2014 08/06/2014 04/02/2014 03/30/2014 11/25/2013 07/22/2013 3/81/8403  Systolic BP 754 360 677 034 035 248 185  Diastolic BP 82 78 70 79 82 82 82  Wt. (Lbs) 362.12 358 325 325 330.08 338 331.08  BMI 56.7 56.06 51.68 52.48 52.48 53.74 52.64   Foot/eye exam completion dates Latest Ref Rng 04/02/2014 01/08/2014  Eye Exam No Retinopathy - No Retinopathy  Foot exam Order - - -  Foot Form Completion - Done -

## 2014-11-02 NOTE — Progress Notes (Signed)
Subjective:    Patient ID: [name], female    DOB: [birth year], 49 y.o.   MRN: [id number]  HPI The PT is here for follow up and re-evaluation of chronic medical conditions, medication management and review of any available recent lab and radiology data.  Preventive health is updated, specifically  Cancer screening and Immunization.   3 week h/o increased back pain radiating top groin, needs medication help, has been off work this past several days and wants to return in am. No specific aggravating factor.Denies lower ext weakness, numbness, or incontinence.  4 to 6 month h/o increased exertional fatigue, also wakes up feeling tired. Needs both lung and heart evaluation, and I explain this to her , also the fact that  she has to discontinue phentermine until this is addressed. Reports being recently evaluated in urgent care setting where she had both a CXR and an EKG will request n both and fwd to cardiology and pulmonary Denies polyuria, polydipsia, blurred vision , or hypoglycemic episodes. Frustrated with ongoing weight gain and inability to get ins coverage for needed bariatric surgery      Review of Systems See HPI Denies recent fever or chills.c/o chronic fatigue Denies sinus pressure, nasal congestion, ear pain or sore throat. Denies chest congestion, productive cough or wheezing. Denies chest pains, palpitations and leg swelling Denies abdominal pain, nausea, vomiting,diarrhea or constipation.   Denies dysuria, frequency, hesitancy or incontinence.  Denies headaches, seizures, numbness, or tingling. Denies depression, anxiety or insomnia. Denies skin break down or rash.        Objective:   Physical Exam  BP 126/82 mmHg  Pulse 93  Resp 18  Ht 5\' 7"  (1.702 m)  Wt 362 lb 1.9 oz (164.257 kg)  BMI 56.70 kg/m2  SpO2 96% Patient alert and oriented and in no cardiopulmonary distress.  HEENT: No facial asymmetry, EOMI,   oropharynx pink and moist.  Neck supple no  JVD, no mass.  Chest: Clear to auscultation bilaterally.  CVS: S1, S2 no murmurs, no S3.Regular rate.  ABD: Soft non tender.   Ext: No edema  EX:[id number]  ROM lumbar  Spine, adequate in  shoulders, hips and knees.  Skin: Intact, no ulcerations or rash noted.  Psych: Good eye contact, normal affect. Memory intact not anxious or depressed appearing.  CNS: CN 2-12 intact, power,  normal throughout.no focal deficits noted.       Assessment & Plan:  Exertional dyspnea Progressive exertional fatigue with poor exercise tolerance despite nicotine cessation x 8 months. Ongoing weight gain, multiple risk factors for CAD. Needs both cardiology and pulmonary eval, will refer for both   HTN, goal below 130/80 Controlled, no change in medication DASH diet and commitment to daily physical activity for a minimum of 30 minutes discussed and encouraged, as a part of hypertension management. The importance of attaining a healthy weight is also discussed.  BP/Weight 10/27/2014 08/06/2014 04/02/2014 03/30/2014 11/25/2013 07/22/2013 4/49/6759  Systolic BP 163 846 659 935 701 779 390  Diastolic BP 82 78 70 79 82 82 82  Wt. (Lbs) 362.12 358 325 325 330.08 338 331.08  BMI 56.7 56.06 51.68 52.48 52.48 53.74 52.64         Diabetes mellitus without complication Controlled, no change in medication Patient advised to reduce carb and sweets, commit to regular physical activity, take meds as prescribed, test blood as directed, and attempt to lose weight, to improve blood sugar control. Patient educated about the importance of limiting  Carbohydrate  intake , the need to commit to daily physical activity for a minimum of 30 minutes , and to commit weight loss. The fact that changes in all these areas will reduce or eliminate all together the development of diabetes is stressed.   Diabetic Labs Latest Ref Rng 08/02/2014 04/02/2014 03/30/2014 11/25/2013 07/22/2013  HbA1c <5.7 % 6.5(H) 7.1(H) - 6.8(H) 6.5(H)    Microalbumin <2.0 mg/dL 0.6 - - - 1.51  Micro/Creat Ratio 0.0 - 30.0 mg/g 4.9 - - - 7.3  Chol 0 - 200 mg/dL - 152 - - -  HDL >39 mg/dL - 54 - - -  Calc LDL 0 - 99 mg/dL - 70 - - -  Triglycerides <150 mg/dL - 140 - - -  Creatinine 0.50 - 1.10 mg/dL 0.78 - 0.80 0.74 0.77   BP/Weight 10/27/2014 08/06/2014 04/02/2014 03/30/2014 11/25/2013 07/22/2013 1/66/0630  Systolic BP 160 109 323 557 322 025 427  Diastolic BP 82 78 70 79 82 82 82  Wt. (Lbs) 362.12 358 325 325 330.08 338 331.08  BMI 56.7 56.06 51.68 52.48 52.48 53.74 52.64   Foot/eye exam completion dates Latest Ref Rng 04/02/2014 01/08/2014  Eye Exam No Retinopathy - No Retinopathy  Foot exam Order - - -  Foot Form Completion - Done -        Morbid obesity with body mass index of 50.0-59.9 in adult Deteriorated. Patient re-educated about  the importance of commitment to a  minimum of 150 minutes of exercise per week.  The importance of healthy food choices with portion control discussed. Encouraged to start a food diary, count calories and to consider  joining a support group. Sample diet sheets offered. Goals set by the patient for the next several months. Though patient would greatly benefit from bariatric surgery, and needs this, states repeatedly that her insurance will not cover this will need to try belviq as a weight loss medication as she has progressive dyspnea as a complaint which needs cardiac and pulmonary evaluation   Weight /BMI 10/27/2014 08/06/2014 04/02/2014  WEIGHT 362 lb 1.9 oz 358 lb 325 lb  HEIGHT 5\' 7"  5\' 7"  5' 6.5"  BMI 56.7 kg/m2 56.06 kg/m2 51.68 kg/m2    Current exercise per week 40 minutes.    Hot flashes Reports that they are less severe than in the past   Chronic fatigue Multifactorial, obesity as well a probabe sleep disorder, needs evaluation  willrefer to pulmonary   Low back pain 3 week history of increased and uncontrolled pain Uncontrolled.  Depo medrol administered IM in the office, to be  followed by a short course of oral prednisone. Bedtime muscle relaxant also prescribed    GERD Controlled on omeprazole medication  sent   Hyposomnia, insomnia or sleeplessness associated with anxiety Sleep hygiene reviewed and written information offered also. Prescription sent for  medication needed. Low dose xanax at bedtime prescribed   Seasonal allergies Dicussed the need for daily use of medication for improved control, states she has meds at home   Mild intermittent reactive airway disease with wheezing without complication Albuterol prescribed for as needed use, and importance of controlling upper airway allergies is discussed

## 2014-11-02 NOTE — Assessment & Plan Note (Signed)
Multifactorial, obesity as well a probabe sleep disorder, needs evaluation  willrefer to pulmonary

## 2014-11-02 NOTE — Assessment & Plan Note (Signed)
Reports that they are less severe than in the past

## 2014-11-03 NOTE — Telephone Encounter (Signed)
Spoke with patient and she will try savings card to make copay $75.  She will call back if she has any problems.

## 2014-11-04 ENCOUNTER — Telehealth: Payer: Self-pay | Admitting: Family Medicine

## 2014-11-04 NOTE — Telephone Encounter (Signed)
Concern addressed

## 2014-11-06 LAB — COMPLETE METABOLIC PANEL WITH GFR
ALT: 10 U/L (ref 0–35)
AST: 11 U/L (ref 0–37)
Albumin: 4 g/dL (ref 3.5–5.2)
Alkaline Phosphatase: 79 U/L (ref 39–117)
BUN: 12 mg/dL (ref 6–23)
CO2: 29 mEq/L (ref 19–32)
Calcium: 9.2 mg/dL (ref 8.4–10.5)
Chloride: 99 mEq/L (ref 96–112)
Creat: 0.76 mg/dL (ref 0.50–1.10)
GFR, Est African American: >89 mL/min
GFR, Est Non African American: >89 mL/min
Glucose, Bld: 106 mg/dL — ABNORMAL HIGH (ref 70–99)
Potassium: 4.7 mEq/L (ref 3.5–5.3)
SODIUM: 137 meq/L (ref 135–145)
Total Bilirubin: 0.4 mg/dL (ref 0.2–1.2)
Total Protein: 6.8 g/dL (ref 6.0–8.3)

## 2014-11-06 LAB — LIPID PANEL
Cholesterol: 172 mg/dL (ref 0–200)
HDL: 76 mg/dL (ref >=46)
LDL Cholesterol: 79 mg/dL (ref 0–99)
TRIGLYCERIDES: 83 mg/dL (ref <150)
Total CHOL/HDL Ratio: 2.3 Ratio
VLDL: 17 mg/dL (ref 0–40)

## 2014-11-06 LAB — HEMOGLOBIN A1C
Hgb A1c MFr Bld: 7.1 % — ABNORMAL HIGH (ref <5.7)
MEAN PLASMA GLUCOSE: 157 mg/dL — AB (ref <117)

## 2014-11-10 ENCOUNTER — Other Ambulatory Visit: Payer: Self-pay

## 2014-11-10 ENCOUNTER — Telehealth: Payer: Self-pay

## 2014-11-10 DIAGNOSIS — F4323 Adjustment disorder with mixed anxiety and depressed mood: Secondary | ICD-10-CM

## 2014-11-10 MED ORDER — BUPROPION HCL ER (SR) 150 MG PO TB12: 150.0000 mg | ORAL_TABLET | Freq: Two times a day (BID) | ORAL | Status: DC

## 2014-11-10 NOTE — Telephone Encounter (Signed)
Yes pls resume at dose she was on perviously, offer referral for counselling also, I wuiill sign if she agreea

## 2014-11-10 NOTE — Telephone Encounter (Signed)
Med sent to pharmacy.  Patient states the would like to hold on counseling at this time.

## 2014-12-08 ENCOUNTER — Encounter: Payer: Self-pay | Admitting: Cardiovascular Disease

## 2014-12-08 ENCOUNTER — Ambulatory Visit (INDEPENDENT_AMBULATORY_CARE_PROVIDER_SITE_OTHER): Payer: Managed Care, Other (non HMO) | Admitting: Cardiovascular Disease

## 2014-12-08 VITALS — BP 110/86 | HR 89 | Ht 67.0 in | Wt 360.0 lb

## 2014-12-08 DIAGNOSIS — R0609 Other forms of dyspnea: Secondary | ICD-10-CM

## 2014-12-08 DIAGNOSIS — Z6841 Body Mass Index (BMI) 40.0 and over, adult: Secondary | ICD-10-CM | POA: Diagnosis not present

## 2014-12-08 DIAGNOSIS — R0600 Dyspnea, unspecified: Secondary | ICD-10-CM

## 2014-12-08 NOTE — Progress Notes (Signed)
Cardiology Office Note   Date:  12/08/2014   ID:  BROOKIE WAYMENT, DOB [birth year], MRN [id number]  PCP:  Tula Nakayama, MD  Cardiologist:   Acie Fredrickson Wonda Cheng, MD   Chief Complaint  Patient presents with  . Shortness of Breath   Problem List 1. Dyspnea 2. Morbid obesity 3. Diabetes mellitus  4. COPD    History of Present Illness: [name] is a 49 y.o. female who presents for  Shortness of breat.  3 - 4 months  In duratin DOE with waking from car. Has COPD.    No chest pain ,  Does have some anxiety. 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     Past Medical History  Diagnosis Date  . Nicotine addiction   . Obesity   . Hypertension   . Sinusitis   . Herpes zoster complicated 0/3833    affecting right eye with blured vision    Past Surgical History  Procedure Laterality Date  . Cholecystectomy    . Btl    . Marsupraliztion of left bartholin's cyst  2004     Current Outpatient Prescriptions  Medication Sig Dispense Refill  . acetaminophen (TYLENOL) 325 MG tablet Take 650 mg by mouth as needed.    Marland Kitchen albuterol (PROVENTIL HFA;VENTOLIN HFA) 108 (90 BASE) MCG/ACT inhaler Inhale 2 puffs into the lungs every 6 (six) hours as needed for wheezing or shortness of breath. 1 Inhaler 2  . ALPRAZolam (XANAX) 0.5 MG tablet Take 0.5 mg by mouth at bedtime.    Marland Kitchen buPROPion (WELLBUTRIN SR) 150 MG 12 hr tablet Take 1 tablet (150 mg total) by mouth 2 (two) times daily. 60 tablet 4  . Calcium Carbonate-Vitamin D (CALCIUM + D PO) Take 1 tablet by mouth daily.     . cyclobenzaprine (FLEXERIL) 5 MG tablet One tablet at bedtime as needed, for muscle spasm 30 tablet 0  . fluticasone (FLONASE) 50 MCG/ACT nasal spray PLACE 2 SPRAYS INTO THE NOSE DAILY. 16 g 3  . furosemide (LASIX) 20 MG tablet Take 20 mg by mouth daily as needed. FOR FLUID OR SWELLING    . glucose blood test strip Once daily testing 100 each 11  .  KLOR-CON M20 20 MEQ tablet Take 20 mEq by mouth daily as needed. WITH FLUID PILL    . losartan (COZAAR) 25 MG tablet TAKE 1 TABLET BY MOUTH EVERY DAY 30 tablet 1  . metFORMIN (GLUCOPHAGE) 500 MG tablet TAKE ONE TABLET BY MOUTH TWICE DAILY WITH MEALS 60 tablet 4  . omeprazole (PRILOSEC) 40 MG capsule Take 1 capsule (40 mg total) by mouth daily. 30 capsule 3  . ONE TOUCH LANCETS MISC by Does not apply route.       No current facility-administered medications for this visit.    Allergies:   Ibuprofen; Phentermine; and Statins    Social History:  The patient  reports that she quit smoking about 9 months ago. Her smoking use included Cigarettes. She smoked 0.30 packs per day. She does not have any smokeless tobacco history on file. She reports that she does not drink alcohol or use illicit drugs.   Family History:  The patient's family history includes Diabetes in her father and mother; Heart disease in her sister; Hypertension in her brother, father, mother, and sister; Stroke in her mother.    ROS:  Please see the history of present illness.  Review of Systems: Constitutional:  denies fever, chills, diaphoresis, appetite change and fatigue.  HEENT: denies photophobia, eye pain, redness, hearing loss, ear pain, congestion, sore throat, rhinorrhea, sneezing, neck pain, neck stiffness and tinnitus.  Respiratory: denies SOB, DOE, cough, chest tightness, and wheezing.  Cardiovascular: denies chest pain, palpitations and leg swelling.  Gastrointestinal: denies nausea, vomiting, abdominal pain, diarrhea, constipation, blood in stool.  Genitourinary: denies dysuria, urgency, frequency, hematuria, flank pain and difficulty urinating.  Musculoskeletal: denies  myalgias, back pain, joint swelling, arthralgias and gait problem.   Skin: denies pallor, rash and wound.  Neurological: denies dizziness, seizures, syncope, weakness, light-headedness, numbness and headaches.   Hematological: denies  adenopathy, easy bruising, personal or family bleeding history.  Psychiatric/ Behavioral: denies suicidal ideation, mood changes, confusion, nervousness, sleep disturbance and agitation.       All other systems are reviewed and negative.    PHYSICAL EXAM: VS:  BP 110/86 mmHg  Pulse 89  Ht 5\' 7"  (1.702 m)  Wt 163.295 kg (360 lb)  BMI 56.37 kg/m2 , BMI Body mass index is 56.37 kg/(m^2). GEN: Well nourished, well developed, in no acute distress HEENT: normal Neck: no JVD, carotid bruits, or masses Cardiac: RRR; no murmurs, rubs, or gallops,no edema  Respiratory:  clear to auscultation bilaterally, normal work of breathing GI: soft, nontender, nondistended, + BS MS: no deformity or atrophy Skin: warm and dry, no rash Neuro:  Strength and sensation are intact Psych: normal   EKG:  EKG is ordered today. The ekg ordered today demonstrates :  NSR at 89.  1st degree av block low voltage.    Recent Labs: 08/02/2014: Hemoglobin 12.3; Platelets 267 11/05/2014: ALT 10; BUN 12; Creatinine 0.76; Potassium 4.7; Sodium 137    Lipid Panel    Component Value Date/Time   CHOL 172 11/05/2014 0844   TRIG 83 11/05/2014 0844   HDL 76 11/05/2014 0844   CHOLHDL 2.3 11/05/2014 0844   VLDL 17 11/05/2014 0844   LDLCALC 79 11/05/2014 0844      Wt Readings from Last 3 Encounters:  12/08/14 163.295 kg (360 lb)  10/27/14 164.257 kg (362 lb 1.9 oz)  08/06/14 162.388 kg (358 lb)      Other studies Reviewed: Additional studies/ records that were reviewed today include: . Review of the above records demonstrates:    ASSESSMENT AND PLAN:  1. Dyspnea - lungs are clear. I think that her primary issue is her morbid obesity.    Further assessment would be difficult -echo images would be technically difficult.  2. Morbid obesity - i think this is her major issue.  She needs to lose ~200 lbs.    An echo would be very difficult . I've advised her to work on a diet and exercise and weight loss  program .  I've given her the name of an nutritionist. .   I will see her as needed.   3. Diabetes mellitus  4. COPD    Current medicines are reviewed at length with the patient today.  The patient does not have concerns regarding medicines.  The following changes have been made:  no change  Labs/ tests ordered today include:  No orders of the defined types were placed in this encounter.     Disposition:   FU with me as needed.       Nahser, Wonda Cheng, MD  12/08/2014 3:07 PM    [facility] [address], [place], [place]  [address] Phone: [phone];  Fax: [phone]   [facility]  [address] [place], [place]  [address] [phone]    Fax [phone]

## 2014-12-08 NOTE — Patient Instructions (Addendum)
Nutritionist - Estelle Grumbles  , [phone] [email or web address]   Medication Instructions:  Your physician recommends that you continue on your current medications as directed. Please refer to the Current Medication list given to you today.  Labwork: None Ordered  Testing/Procedures: None Ordered  Follow-Up: Your physician recommends that you schedule a follow-up appointment in: as needed

## 2014-12-09 ENCOUNTER — Ambulatory Visit: Payer: Managed Care, Other (non HMO) | Admitting: Family Medicine

## 2014-12-10 ENCOUNTER — Telehealth: Payer: Self-pay | Admitting: Family Medicine

## 2014-12-10 NOTE — Telephone Encounter (Signed)
Needs to see lung specialist also, pls see if belviq  V can be PA'd  , belviq 10mg  one twice daily

## 2014-12-10 NOTE — Telephone Encounter (Signed)
Will you review cardiology consult to see if patient can restart Phentermine.

## 2014-12-11 ENCOUNTER — Other Ambulatory Visit: Payer: Self-pay | Admitting: Family Medicine

## 2014-12-12 ENCOUNTER — Telehealth: Payer: Self-pay | Admitting: Family Medicine

## 2014-12-12 ENCOUNTER — Telehealth: Payer: Self-pay

## 2014-12-12 NOTE — Telephone Encounter (Signed)
pls read previous message and exxplain I tried calling her  The black box warning for phentermine is pulmonary / lung failure, she does need lung to be fully evaluated including the sleep study before she gets back onnthe phentermine. She is reporting increased fatigue , I understand that some of this is from her weight Pls tell her I am trying my best to help her offer a letter to her ins company which has repeatedly refused coverage for bariaitric surgery which she needs and wants, I still have a hard time seeing why she is being denied that. Letter needs to state wt, BMI and co morbidities-pls, as well as multiple attempts at weight loss over the years I will sign

## 2014-12-12 NOTE — Telephone Encounter (Signed)
States the belviq is $400 and she wants the phentermine CAP sent in. Said the heart Dr said nothing was wrong with her heart. She is wanting this sent today.

## 2014-12-12 NOTE — Telephone Encounter (Signed)
Patient aware.

## 2014-12-12 NOTE — Telephone Encounter (Signed)
Called patient and left message for them to return call at the office   

## 2014-12-12 NOTE — Telephone Encounter (Signed)
See previous message

## 2014-12-12 NOTE — Telephone Encounter (Signed)
Pt aware.

## 2014-12-31 ENCOUNTER — Encounter: Payer: Self-pay | Admitting: Pulmonary Disease

## 2014-12-31 ENCOUNTER — Ambulatory Visit (INDEPENDENT_AMBULATORY_CARE_PROVIDER_SITE_OTHER)
Admission: RE | Admit: 2014-12-31 | Discharge: 2014-12-31 | Disposition: A | Discharge status: Home / Self Care | Payer: Managed Care, Other (non HMO) | Source: Ambulatory Visit | Attending: Pulmonary Disease | Admitting: Pulmonary Disease

## 2014-12-31 ENCOUNTER — Ambulatory Visit (INDEPENDENT_AMBULATORY_CARE_PROVIDER_SITE_OTHER): Payer: Managed Care, Other (non HMO) | Admitting: Pulmonary Disease

## 2014-12-31 ENCOUNTER — Telehealth: Payer: Self-pay | Admitting: Pulmonary Disease

## 2014-12-31 VITALS — BP 120/84 | HR 95 | Ht 66.5 in | Wt 361.0 lb

## 2014-12-31 DIAGNOSIS — R0600 Dyspnea, unspecified: Secondary | ICD-10-CM

## 2014-12-31 DIAGNOSIS — Z87891 Personal history of nicotine dependence: Secondary | ICD-10-CM | POA: Diagnosis not present

## 2014-12-31 DIAGNOSIS — Z6841 Body Mass Index (BMI) 40.0 and over, adult: Secondary | ICD-10-CM

## 2014-12-31 DIAGNOSIS — R0683 Snoring: Secondary | ICD-10-CM

## 2014-12-31 NOTE — Progress Notes (Deleted)
   Subjective:    Patient ID: [name], female    DOB: [birth year], 49 y.o.   MRN: [id number]  HPI    Review of Systems  Constitutional: Positive for unexpected weight change. Negative for fever.  HENT: Positive for congestion, sneezing and trouble swallowing. Negative for dental problem, ear pain, nosebleeds, postnasal drip, rhinorrhea, sinus pressure and sore throat.   Eyes: Negative for redness and itching.  Respiratory: Positive for shortness of breath. Negative for cough, chest tightness and wheezing.   Cardiovascular: Negative for palpitations and leg swelling.  Gastrointestinal: Negative for nausea and vomiting.       Acid Heartburn // Indigestion  Genitourinary: Negative for dysuria.  Musculoskeletal: Positive for arthralgias. Negative for joint swelling.  Skin: Positive for rash ( itching).  Neurological: Negative for headaches.  Hematological: Does not bruise/bleed easily.  Psychiatric/Behavioral: Positive for dysphoric mood. The patient is nervous/anxious.        Objective:   Physical Exam        Assessment & Plan:

## 2014-12-31 NOTE — Progress Notes (Signed)
Chief Complaint  Patient presents with  . CONSULT    Patient needs clearance to restart Phentermine weight loss pills. Concern of pulmonary related issues.     History of Present Illness: [name] is a 49 y.o. female former smoker for evaluation of dyspnea.  She has trouble with her weight.  She has been trying to lose weight, but her efforts have been unsuccessful.  She was on phentermine previously and this helped.  She would like to get back on this again.  She has looked into bariatric surgery, but has issues with insurance coverage.  She has trouble with her breathing.  This happens when she exerts herself too much.  She does okay with routine activity (walking in grocery store).  She used to have trouble with cough, sputum and wheeze >> these all resolved after she quit smoking in August 2015. There is no prior history of asthma, pneumonia, or tuberculosis.  She denies hx of leg/lung clots.  She has not had recent PFT or CXR.    She does snore.  Her husband used to prod her to start breathing at times while asleep.  She says this hasn't happened recently.  She feels her sleep isn't an issue, and she feels rested during the day.   [name]  has a past medical history of Nicotine addiction; Obesity; Hypertension; Sinusitis; Herpes zoster complicated (11/1759); Diabetes; and Chronic headaches.  [name]  has past surgical history that includes Cholecystectomy; Btl; [place] CYST (2004); and Tubal ligation.  Prior to Admission medications   Medication Sig Start Date End Date Taking? Authorizing Provider  acetaminophen (TYLENOL) 325 MG tablet Take 650 mg by mouth as needed.   Yes Historical Provider, MD  albuterol (PROVENTIL HFA;VENTOLIN HFA) 108 (90 BASE) MCG/ACT inhaler Inhale 2 puffs into the lungs every 6 (six) hours as needed for wheezing or shortness of breath. 10/27/14  Yes Fayrene Helper, MD  ALPRAZolam Duanne Moron) 0.5 MG tablet Take 0.5  mg by mouth at bedtime. 11/25/13  Yes Historical Provider, MD  buPROPion (WELLBUTRIN SR) 150 MG 12 hr tablet Take 1 tablet (150 mg total) by mouth 2 (two) times daily. Patient taking differently: Take 150 mg by mouth daily.  11/10/14 11/10/15 Yes Fayrene Helper, MD  Calcium Carbonate-Vitamin D (CALCIUM + D PO) Take 1 tablet by mouth daily.    Yes Historical Provider, MD  cyclobenzaprine (FLEXERIL) 5 MG tablet One tablet at bedtime as needed, for muscle spasm 11/25/13  Yes Fayrene Helper, MD  fluticasone (FLONASE) 50 MCG/ACT nasal spray PLACE 2 SPRAYS INTO THE NOSE DAILY. 08/06/14  Yes Fayrene Helper, MD  furosemide (LASIX) 20 MG tablet Take 20 mg by mouth daily as needed. FOR FLUID OR SWELLING 12/05/14  Yes Historical Provider, MD  glucose blood test strip Once daily testing 04/01/11  Yes Fayrene Helper, MD  KLOR-CON M20 20 MEQ tablet Take 20 mEq by mouth daily as needed. WITH FLUID PILL 12/05/14  Yes Historical Provider, MD  losartan (COZAAR) 25 MG tablet TAKE 1 TABLET BY MOUTH EVERY DAY 11/15/13  Yes Fayrene Helper, MD  losartan (COZAAR) 25 MG tablet TAKE 1 TABLET BY MOUTH EVERY DAY 12/11/14  Yes Fayrene Helper, MD  metFORMIN (GLUCOPHAGE) 500 MG tablet TAKE ONE TABLET BY MOUTH TWICE DAILY WITH MEALS 08/25/14  Yes Fayrene Helper, MD  omeprazole (PRILOSEC) 40 MG capsule Take 1 capsule (40 mg total) by mouth daily. 11/02/14  Yes Margaret  Bartholome Bill, MD  ONE TOUCH LANCETS MISC by Does not apply route.     Yes Historical Provider, MD    Allergies  Allergen Reactions  . Ibuprofen   . Phentermine Other (See Comments)    Experiencing progressive dyspnea, needs cardiac and pulmonary evaluation  . Statins Other (See Comments)    Muscle cramps    Her family history includes Cancer in her other; Diabetes in her father and mother; Heart disease in her sister; Hypertension in her brother, father, mother, and sister; Rheum arthritis in her father and mother; Stroke in her mother.  She   reports that she quit smoking about 9 months ago. Her smoking use included Cigarettes. She has a 45 pack-year smoking history. She does not have any smokeless tobacco history on file. She reports that she does not drink alcohol or use illicit drugs.  Review of Systems  Constitutional: Positive for unexpected weight change. Negative for fever.  HENT: Positive for congestion, sneezing and trouble swallowing. Negative for dental problem, ear pain, nosebleeds, postnasal drip, rhinorrhea, sinus pressure and sore throat.   Eyes: Negative for redness and itching.  Respiratory: Positive for shortness of breath. Negative for cough, chest tightness and wheezing.   Cardiovascular: Negative for palpitations and leg swelling.  Gastrointestinal: Negative for nausea and vomiting.       Acid Heartburn // Indigestion  Genitourinary: Negative for dysuria.  Musculoskeletal: Positive for arthralgias. Negative for joint swelling.  Skin: Positive for rash ( itching).  Neurological: Negative for headaches.  Hematological: Does not bruise/bleed easily.  Psychiatric/Behavioral: Positive for dysphoric mood. The patient is nervous/anxious.    Physical Exam: Blood pressure 120/84, pulse 95, height 5' 6.5" (1.689 m), weight 361 lb (163.749 kg), SpO2 97 %. Body mass index is 57.4 kg/(m^2).  General - No distress ENT - No sinus tenderness, no oral exudate, no LAN, no thyromegaly, TM clear, pupils equal/reactive, MP 3, scalloped tongue Cardiac - s1s2 regular, no murmur, pulses symmetric Chest - No wheeze/rales/dullness, good air entry, normal respiratory excursion Back - No focal tenderness Abd - Soft, non-tender, no organomegaly, + bowel sounds Ext - No edema Neuro - Normal strength, cranial nerves intact Skin - No rashes Psych - Normal mood, and behavior   US Venous Img Lower Bilateral  03/30/2014   CLINICAL DATA:  Bilateral lower extremity swelling and erythema.  EXAM: BILATERAL LOWER EXTREMITY VENOUS DOPPLER  ULTRASOUND  TECHNIQUE: Gray-scale sonography with graded compression, as well as color Doppler and duplex ultrasound were performed to evaluate the lower extremity deep venous systems from the level of the common femoral vein and including the common femoral, femoral, profunda femoral, popliteal and calf veins including the posterior tibial, peroneal and gastrocnemius veins when visible. The superficial great saphenous vein was also interrogated. Spectral Doppler was utilized to evaluate flow at rest and with distal augmentation maneuvers in the common femoral, femoral and popliteal veins.  COMPARISON:  None.  FINDINGS: RIGHT LOWER EXTREMITY  Common Femoral Vein: No evidence of thrombus. Normal compressibility, respiratory phasicity and response to augmentation.  Saphenofemoral Junction: No evidence of thrombus. Normal compressibility and flow on color Doppler imaging.  Profunda Femoral Vein: No evidence of thrombus. Normal compressibility and flow on color Doppler imaging.  Femoral Vein: No evidence of thrombus. Normal compressibility, respiratory phasicity and response to augmentation.  Popliteal Vein: No evidence of thrombus. Normal compressibility, respiratory phasicity and response to augmentation. A small cystic area seen in the popliteal fossa measuring 2.7 x 0.9 x 1.5 cm.  Calf Veins: No evidence of thrombus. Normal compressibility and flow on color Doppler imaging. A superficial varicose vein is seen in the medial right lower calf, which remains patent.  Superficial Great Saphenous Vein: No evidence of thrombus. Normal compressibility and flow on color Doppler imaging.  Venous Reflux:  None.  Other Findings:  None.  LEFT LOWER EXTREMITY  Common Femoral Vein: No evidence of thrombus. Normal compressibility, respiratory phasicity and response to augmentation.  Saphenofemoral Junction: No evidence of thrombus. Normal compressibility and flow on color Doppler imaging.  Profunda Femoral Vein: No evidence of  thrombus. Normal compressibility and flow on color Doppler imaging.  Femoral Vein: No evidence of thrombus. Normal compressibility, respiratory phasicity and response to augmentation.  Popliteal Vein: No evidence of thrombus. Normal compressibility, respiratory phasicity and response to augmentation.  Calf Veins: No evidence of thrombus. Normal compressibility and flow on color Doppler imaging.  Superficial Great Saphenous Vein: No evidence of thrombus. Normal compressibility and flow on color Doppler imaging.  Venous Reflux:  None.  Other Findings:  None.  IMPRESSION: No evidence of deep venous thrombosis in either lower extremity.  Small right popliteal fossa Baker cyst. Superficial varicose vein also noted in the medial right lower calf, which remains patent.   Electronically Signed   By: Earle Gell M.D.   On: 03/30/2014 18:46    Lab Results  Component Value Date   WBC 5.4 08/02/2014   HGB 12.3 08/02/2014   HCT 40.9 08/02/2014   MCV 73.6* 08/02/2014   PLT 267 08/02/2014    Lab Results  Component Value Date   CREATININE 0.76 11/05/2014   BUN 12 11/05/2014   NA 137 11/05/2014   K 4.7 11/05/2014   CL 99 11/05/2014   CO2 29 11/05/2014    Lab Results  Component Value Date   ALT 10 11/05/2014   AST 11 11/05/2014   ALKPHOS 79 11/05/2014   BILITOT 0.4 11/05/2014    Lab Results  Component Value Date   TSH 1.598 11/25/2013   Discussion: She has difficulty with her weight and is morbidly obese.  She has hypertension and diabetes.  She has struggled with her weight loss efforts.  She has problems with dyspnea with exertion, but this has improved since she stopped smoking in August 2015.  She does also snore, but is not convinced she has any other issue with her sleep.  She would like to try restarting phentermine for weight loss.  Assessment/Plan:  Dyspnea >> mostly likely related to her weight. Plan: - will check CXR, PFT, and Echo  Hx of tobacco abuse. Plan: - will check CXR  and PFT to further assess for obstructive lung disease - defer inhaler therapy for now  Morbid obesity. She would like to resume phentermine. Plan: - will check Echo to assess RV pressures primary to determine whether she would be candidate for phentermine  Snoring. Plan: - she would like to defer sleep testing for now and continue to work on her weight   Chesley Mires, MD [place] Pager:  [phone]

## 2014-12-31 NOTE — Patient Instructions (Signed)
Chest xray today Will schedule Echocardiogram and pulmonary function testing Follow up in 6 to 8 weeks

## 2014-12-31 NOTE — Telephone Encounter (Signed)
Dg Chest 2 View  12/31/2014   CLINICAL DATA:  Dyspnea  EXAM: CHEST - 2 VIEW  COMPARISON:  None.  FINDINGS: The heart size and mediastinal contours are within normal limits. Both lungs are clear. The visualized skeletal structures are unremarkable.  IMPRESSION: No active disease.   Electronically Signed   By: Inez Catalina M.D.   On: 12/31/2014 14:56    Will have my nurse inform pt that CXR is normal.  Will call back once PFT and Echo are available.

## 2015-01-01 NOTE — Telephone Encounter (Signed)
Results have been explained to patient, pt expressed understanding. Nothing further needed.  

## 2015-01-16 ENCOUNTER — Other Ambulatory Visit: Payer: Self-pay

## 2015-01-16 MED ORDER — KLOR-CON M20 20 MEQ PO TBCR: 20.0000 meq | EXTENDED_RELEASE_TABLET | Freq: Every day | ORAL | Status: DC | PRN

## 2015-01-16 MED ORDER — FUROSEMIDE 20 MG PO TABS: 20.0000 mg | ORAL_TABLET | Freq: Every day | ORAL | Status: DC | PRN

## 2015-01-19 ENCOUNTER — Other Ambulatory Visit: Payer: Self-pay

## 2015-01-19 ENCOUNTER — Ambulatory Visit (HOSPITAL_COMMUNITY): Payer: Managed Care, Other (non HMO) | Attending: Pulmonary Disease

## 2015-01-19 DIAGNOSIS — R0600 Dyspnea, unspecified: Secondary | ICD-10-CM | POA: Diagnosis present

## 2015-01-20 ENCOUNTER — Telehealth: Payer: Self-pay | Admitting: Pulmonary Disease

## 2015-01-20 NOTE — Telephone Encounter (Signed)
Echo 01/19/15 >> EF 60 to 65%, normal PA pressures  Will have my nurse inform pt that Echo results were normal.  Will call back with results of PFT and review tests results in more detail at her next ROV.

## 2015-01-20 NOTE — Telephone Encounter (Signed)
Results have been explained to patient, pt expressed understanding. Nothing further needed.  

## 2015-01-29 ENCOUNTER — Ambulatory Visit: Payer: Managed Care, Other (non HMO) | Admitting: Family Medicine

## 2015-01-29 ENCOUNTER — Telehealth: Payer: Self-pay | Admitting: *Deleted

## 2015-01-29 NOTE — Telephone Encounter (Signed)
Order faxed to requested location

## 2015-01-29 NOTE — Telephone Encounter (Signed)
Pt called stating she needs her lab order faxed to [facility], pt does not have a fax number, I did not see any lab orders for pt. Please advise pt has a aptp 02/03/15 pt also stated if she does not answer to [facility] she is driving.

## 2015-01-31 LAB — COMPLETE METABOLIC PANEL WITH GFR
ALK PHOS: 76 U/L (ref 39–117)
ALT: 10 U/L (ref 0–35)
AST: 13 U/L (ref 0–37)
Albumin: 3.7 g/dL (ref 3.5–5.2)
BILIRUBIN TOTAL: 0.2 mg/dL (ref 0.2–1.2)
BUN: 11 mg/dL (ref 6–23)
CHLORIDE: 102 meq/L (ref 96–112)
CO2: 26 mEq/L (ref 19–32)
Calcium: 9.2 mg/dL (ref 8.4–10.5)
Creat: 0.77 mg/dL (ref 0.50–1.10)
GFR, Est African American: >89 mL/min
GFR, Est Non African American: >89 mL/min
GLUCOSE: 143 mg/dL — AB (ref 70–99)
POTASSIUM: 4.3 meq/L (ref 3.5–5.3)
Sodium: 139 mEq/L (ref 135–145)
TOTAL PROTEIN: 6.1 g/dL (ref 6.0–8.3)

## 2015-01-31 LAB — HEMOGLOBIN A1C
HEMOGLOBIN A1C: 7.1 % — AB (ref <5.7)
MEAN PLASMA GLUCOSE: 157 mg/dL — AB (ref <117)

## 2015-01-31 LAB — TSH: TSH: 1.401 u[IU]/mL (ref 0.350–4.500)

## 2015-02-03 ENCOUNTER — Encounter: Payer: Self-pay | Admitting: Family Medicine

## 2015-02-03 ENCOUNTER — Ambulatory Visit (INDEPENDENT_AMBULATORY_CARE_PROVIDER_SITE_OTHER): Payer: Managed Care, Other (non HMO) | Admitting: Family Medicine

## 2015-02-03 VITALS — BP 130/84 | HR 86 | Resp 16 | Ht 67.0 in | Wt 364.0 lb

## 2015-02-03 DIAGNOSIS — I868 Varicose veins of other specified sites: Secondary | ICD-10-CM

## 2015-02-03 DIAGNOSIS — M545 Low back pain, unspecified: Secondary | ICD-10-CM

## 2015-02-03 DIAGNOSIS — E119 Type 2 diabetes mellitus without complications: Secondary | ICD-10-CM | POA: Diagnosis not present

## 2015-02-03 DIAGNOSIS — J302 Other seasonal allergic rhinitis: Secondary | ICD-10-CM

## 2015-02-03 DIAGNOSIS — I10 Essential (primary) hypertension: Secondary | ICD-10-CM | POA: Diagnosis not present

## 2015-02-03 DIAGNOSIS — J3089 Other allergic rhinitis: Secondary | ICD-10-CM

## 2015-02-03 DIAGNOSIS — I8390 Asymptomatic varicose veins of unspecified lower extremity: Secondary | ICD-10-CM

## 2015-02-03 DIAGNOSIS — E1140 Type 2 diabetes mellitus with diabetic neuropathy, unspecified: Secondary | ICD-10-CM

## 2015-02-03 DIAGNOSIS — Z6841 Body Mass Index (BMI) 40.0 and over, adult: Secondary | ICD-10-CM

## 2015-02-03 MED ORDER — PHENTERMINE HCL 37.5 MG PO CAPS: 37.5000 mg | ORAL_CAPSULE | ORAL | Status: DC

## 2015-02-03 MED ORDER — GLUCOSE BLOOD VI STRP: ORAL_STRIP | Status: DC

## 2015-02-03 MED ORDER — POTASSIUM CHLORIDE CRYS ER 20 MEQ PO TBCR: 20.0000 meq | EXTENDED_RELEASE_TABLET | Freq: Two times a day (BID) | ORAL | Status: DC

## 2015-02-03 MED ORDER — FUROSEMIDE 40 MG PO TABS: 40.0000 mg | ORAL_TABLET | Freq: Every day | ORAL | Status: DC

## 2015-02-03 MED ORDER — METHYLPREDNISOLONE ACETATE 80 MG/ML IJ SUSP
80.0000 mg | Freq: Once | INTRAMUSCULAR | Status: AC
  Administered 2015-02-03: 80 mg via INTRAMUSCULAR

## 2015-02-03 MED ORDER — AZELASTINE HCL 0.1 % NA SOLN: 2.0000 | Freq: Two times a day (BID) | NASAL | Status: DC

## 2015-02-03 MED ORDER — LOSARTAN POTASSIUM 50 MG PO TABS: 50.0000 mg | ORAL_TABLET | Freq: Every day | ORAL | Status: DC

## 2015-02-03 NOTE — Patient Instructions (Addendum)
F/u in 3 month, call if you need me befoore  You do qualify for diabetic shoes based on exam , letter will be sent to your ins with the exam done today  Pls get eye exam  New dose of lasix and potassium   Use support hose every day  TAKE metformin TWO daily  Pls  keep appt with nutritionist  Commit to exercise for 30 mins 7 days per week  New is phentermine one daily, eat on a regular schedule  Additional inhaler for allergies, use both every day  Depo medrol today for allergies  Non fasting labs before next visit  Thanks for choosing [facility], we consider it a privelige to serve you.

## 2015-02-08 NOTE — Assessment & Plan Note (Signed)
Uncontrolled, depo medrol in office 

## 2015-02-08 NOTE — Assessment & Plan Note (Signed)
Acute flare, depo medrol and use of muscle relaxant as needed.  Good posture and back strengthening exercises encouraged also

## 2015-02-08 NOTE — Assessment & Plan Note (Signed)
Deteriorated. Patient re-educated about  the importance of commitment to a  minimum of 150 minutes of exercise per week.  The importance of healthy food choices with portion control discussed. Encouraged to start a food diary, count calories and to consider  joining a support group. Sample diet sheets offered. Goals set by the patient for the next several months.   Weight /BMI 02/03/2015 12/31/2014 12/08/2014  WEIGHT 364 lb 361 lb 360 lb  HEIGHT 5\' 7"  5' 6.5" 5\' 7"   BMI 57 kg/m2 57.4 kg/m2 56.37 kg/m2    Current exercise per week 60  Minutes. Start phentermine with weight loss goal of 3 to 4 pounds per month

## 2015-02-08 NOTE — Progress Notes (Signed)
[name]     MRN: [id number]      DOB: 01/29/1966   HPI [name] is here for follow up and re-evaluation of chronic medical conditions, medication management and review of any available recent lab and radiology data.  Preventive health is updated, specifically  Cancer screening and Immunization.   Questions or concerns regarding consultations or procedures which the PT has had in the interim are  Addressed.Has seen both cardiologist and pulmonary, and now wants to start phentermine again to help weight loss attempts The PT reports being unable to tolerate welbutrin, she remains nicotine free however, which is great. C/o leg swelling at the end of the day, which resolves overnight, is aware of the need to wear compression hose but is non compliant C/o uncontrolled allergy symptoms, nasal stuffiness, and drainage, no fever or chills C/o mid to low back pain with activity non radiaiting  ROS Denies recent fever or chills. Denies sinus pressure, nasal congestion, ear pain or sore throat. Denies chest congestion, productive cough or wheezing. Denies chest pains, palpitations and leg swelling Denies abdominal pain, nausea, vomiting,diarrhea or constipation.   Denies dysuria, frequency, hesitancy or incontinence.  Denies headaches, seizures, numbness, or tingling. Denies uncontrolled  depression, anxiety or insomnia. Denies skin break down or rash.   PE  BP 130/84 mmHg  Pulse 86  Resp 16  Ht 5\' 7"  (1.702 m)  Wt 364 lb (165.109 kg)  BMI 57.00 kg/m2  SpO2 95%  Patient alert and oriented and in no cardiopulmonary distress.  HEENT: No facial asymmetry, EOMI,   oropharynx pink and moist.  Neck supple no JVD, no mass.  Chest: Clear to auscultation bilaterally.  CVS: S1, S2 no murmurs, no S3.Regular rate.  ABD: Soft non tender.   Ext: No edema  MS:  Though reduced ROM spine, shoulders, hips and knees.  Skin: Intact, no ulcerations or rash noted.  Psych: Good eye  contact, normal affect. Memory intact not anxious or depressed appearing.  CNS: CN 2-12 intact, power,  normal throughout.no focal deficits noted.   Assessment & Plan   HTN, goal below 130/80 Controlled, no change in medication DASH diet and commitment to daily physical activity for a minimum of 30 minutes discussed and encouraged, as a part of hypertension management. The importance of attaining a healthy weight is also discussed.  BP/Weight 02/03/2015 12/31/2014 12/08/2014 10/27/2014 08/06/2014 01/30/3902 0/0/9233  Systolic BP 007 622 633 354 562 563 893  Diastolic BP 84 84 86 82 78 70 79  Wt. (Lbs) 364 361 360 362.12 358 325 325  BMI 57 57.4 56.37 56.7 56.06 51.68 52.48        Varicose vein Non compliant with use of compression hose with c/o leg swelling , re educated re need to use support hose  Allergic rhinitis Uncontrolled, depo medrol in office  Diabetes mellitus with neuropathy Controlled, no change in medication [name] is reminded of the importance of commitment to daily physical activity for 30 minutes or more, as able and the need to limit carbohydrate intake to 30 to 60 grams per meal to help with blood sugar control.   The need to take medication as prescribed, test blood sugar as directed, and to call between visits if there is a concern that blood sugar is uncontrolled is also discussed.   [name] is reminded of the importance of daily foot exam, annual eye examination, and good blood sugar, blood pressure and cholesterol control.  Diabetic Labs Latest Ref  Rng 01/30/2015 11/05/2014 08/02/2014 04/02/2014 03/30/2014  HbA1c <5.7 % 7.1(H) 7.1(H) 6.5(H) 7.1(H) -  Microalbumin <2.0 mg/dL - - 0.6 - -  Micro/Creat Ratio 0.0 - 30.0 mg/g - - 4.9 - -  Chol 0 - 200 mg/dL - 172 - 152 -  HDL >=46 mg/dL - 76 - 54 -  Calc LDL 0 - 99 mg/dL - 79 - 70 -  Triglycerides <150 mg/dL - 83 - 140 -  Creatinine 0.50 - 1.10 mg/dL 0.77 0.76 0.78 - 0.80   BP/Weight 02/03/2015 12/31/2014 12/08/2014  10/27/2014 08/06/2014 02/25/6628 10/29/6544  Systolic BP 503 546 568 127 517 001 749  Diastolic BP 84 84 86 82 78 70 79  Wt. (Lbs) 364 361 360 362.12 358 325 325  BMI 57 57.4 56.37 56.7 56.06 51.68 52.48   Foot/eye exam completion dates Latest Ref Rng 02/03/2015 04/02/2014  Eye Exam No Retinopathy - -  Foot exam Order - - -  Foot Form Completion - Done Done         Morbid obesity with body mass index of 50.0-59.9 in adult Deteriorated. Patient re-educated about  the importance of commitment to a  minimum of 150 minutes of exercise per week.  The importance of healthy food choices with portion control discussed. Encouraged to start a food diary, count calories and to consider  joining a support group. Sample diet sheets offered. Goals set by the patient for the next several months.   Weight /BMI 02/03/2015 12/31/2014 12/08/2014  WEIGHT 364 lb 361 lb 360 lb  HEIGHT 5\' 7"  5' 6.5" 5\' 7"   BMI 57 kg/m2 57.4 kg/m2 56.37 kg/m2    Current exercise per week 60  Minutes. Start phentermine with weight loss goal of 3 to 4 pounds per month   Low back pain Acute flare, depo medrol and use of muscle relaxant as needed.  Good posture and back strengthening exercises encouraged also

## 2015-02-08 NOTE — Assessment & Plan Note (Signed)
Non compliant with use of compression hose with c/o leg swelling , re educated re need to use support hose

## 2015-02-08 NOTE — Assessment & Plan Note (Signed)
Controlled, no change in medication DASH diet and commitment to daily physical activity for a minimum of 30 minutes discussed and encouraged, as a part of hypertension management. The importance of attaining a healthy weight is also discussed.  BP/Weight 02/03/2015 12/31/2014 12/08/2014 10/27/2014 08/06/2014 09/25/368 03/30/4382  Systolic BP 818 403 754 360 677 034 035  Diastolic BP 84 84 86 82 78 70 79  Wt. (Lbs) 364 361 360 362.12 358 325 325  BMI 57 57.4 56.37 56.7 56.06 51.68 52.48

## 2015-02-08 NOTE — Assessment & Plan Note (Signed)
Controlled, no change in medication [name] is reminded of the importance of commitment to daily physical activity for 30 minutes or more, as able and the need to limit carbohydrate intake to 30 to 60 grams per meal to help with blood sugar control.   The need to take medication as prescribed, test blood sugar as directed, and to call between visits if there is a concern that blood sugar is uncontrolled is also discussed.   [name] is reminded of the importance of daily foot exam, annual eye examination, and good blood sugar, blood pressure and cholesterol control.  Diabetic Labs Latest Ref Rng 01/30/2015 11/05/2014 08/02/2014 04/02/2014 03/30/2014  HbA1c <5.7 % 7.1(H) 7.1(H) 6.5(H) 7.1(H) -  Microalbumin <2.0 mg/dL - - 0.6 - -  Micro/Creat Ratio 0.0 - 30.0 mg/g - - 4.9 - -  Chol 0 - 200 mg/dL - 172 - 152 -  HDL >=46 mg/dL - 76 - 54 -  Calc LDL 0 - 99 mg/dL - 79 - 70 -  Triglycerides <150 mg/dL - 83 - 140 -  Creatinine 0.50 - 1.10 mg/dL 0.77 0.76 0.78 - 0.80   BP/Weight 02/03/2015 12/31/2014 12/08/2014 10/27/2014 08/06/2014 12/30/3417 12/25/2295  Systolic BP 989 211 941 740 814 481 856  Diastolic BP 84 84 86 82 78 70 79  Wt. (Lbs) 364 361 360 362.12 358 325 325  BMI 57 57.4 56.37 56.7 56.06 51.68 52.48   Foot/eye exam completion dates Latest Ref Rng 02/03/2015 04/02/2014  Eye Exam No Retinopathy - -  Foot exam Order - - -  Foot Form Completion - Done Done

## 2015-02-26 ENCOUNTER — Telehealth: Payer: Self-pay | Admitting: *Deleted

## 2015-02-26 NOTE — Telephone Encounter (Signed)
Pt called requesting to speak with Loma Sousa.

## 2015-02-27 NOTE — Telephone Encounter (Signed)
4 days with no fever, symptomatic only, saline nasal flushes, mucinex DM twice daily and a lot of fluids, tylenol for pain if needed Explain generally most URI clear with no antibioitc in 10 days, avoid antibiotic overuse fro safety  Call abck if fever or worsening

## 2015-02-27 NOTE — Telephone Encounter (Signed)
Patient states that she has sinus drainage. Cough, congestion x 4 days with green sputum.  No fever or chills.   Stated that she recently got air conditioner repaired.

## 2015-03-03 ENCOUNTER — Other Ambulatory Visit: Payer: Self-pay

## 2015-03-03 ENCOUNTER — Telehealth: Payer: Self-pay

## 2015-03-03 MED ORDER — VENLAFAXINE HCL 75 MG PO TABS: 75.0000 mg | ORAL_TABLET | Freq: Every day | ORAL | Status: DC

## 2015-03-03 NOTE — Telephone Encounter (Signed)
Spoke with patient and apologized for delay in returning call.  She states that she feels much better and has tried otc med with good results.

## 2015-03-03 NOTE — Telephone Encounter (Signed)
Patient denies suicidal homicidal thoughts and is in agreement on starting med. Appt scheduled for follow up

## 2015-03-03 NOTE — Telephone Encounter (Addendum)
Call [place] and let her know please, she has been on anti depressant in the past , see if interested in seeing a therapist also , ensure and document not suicidal or homicidal, happy to restart her on effexor 75 mg daily, needs appt here in next 2 to 4 weeks, and pls send in 2 months of antidepressant if she agrees Let therapist know sincer she called also if [name] agrees , thanks

## 2015-03-03 NOTE — Telephone Encounter (Signed)
Therapist at [facility] called and stated that patient seemed to be very depressed and she was concerned and wanted to know if you consider starting patient on an antidepressant. Her number is [phone]

## 2015-03-10 ENCOUNTER — Ambulatory Visit (INDEPENDENT_AMBULATORY_CARE_PROVIDER_SITE_OTHER): Payer: Managed Care, Other (non HMO) | Admitting: Pulmonary Disease

## 2015-03-10 ENCOUNTER — Encounter: Payer: Self-pay | Admitting: Pulmonary Disease

## 2015-03-10 VITALS — BP 120/80 | HR 75 | Ht 67.5 in | Wt 358.0 lb

## 2015-03-10 DIAGNOSIS — R0600 Dyspnea, unspecified: Secondary | ICD-10-CM

## 2015-03-10 DIAGNOSIS — R0683 Snoring: Secondary | ICD-10-CM | POA: Diagnosis not present

## 2015-03-10 DIAGNOSIS — Z6841 Body Mass Index (BMI) 40.0 and over, adult: Secondary | ICD-10-CM

## 2015-03-10 DIAGNOSIS — Z87891 Personal history of nicotine dependence: Secondary | ICD-10-CM

## 2015-03-10 LAB — PULMONARY FUNCTION TEST
DL/VA % pred: 104 %
DL/VA: 5.45 ml/min/mmHg/L
DLCO UNC % PRED: 81 %
DLCO unc: 23.51 ml/min/mmHg
FEF 25-75 Post: 3.22 L/sec
FEF 25-75 Pre: 3.14 L/sec
FEF2575-%CHANGE-POST: 2 %
FEF2575-%PRED-POST: 118 %
FEF2575-%Pred-Pre: 115 %
FEV1-%CHANGE-POST: 0 %
FEV1-%PRED-POST: 78 %
FEV1-%Pred-Pre: 78 %
FEV1-PRE: 2.09 L
FEV1-Post: 2.08 L
FEV1FVC-%CHANGE-POST: -2 %
FEV1FVC-%PRED-PRE: 110 %
FEV6-%Change-Post: 1 %
FEV6-%Pred-Post: 72 %
FEV6-%Pred-Pre: 71 %
FEV6-PRE: 2.32 L
FEV6-Post: 2.36 L
FEV6FVC-%PRED-PRE: 102 %
FEV6FVC-%Pred-Post: 102 %
FVC-%CHANGE-POST: 1 %
FVC-%PRED-POST: 71 %
FVC-%Pred-Pre: 70 %
FVC-PRE: 2.32 L
FVC-Post: 2.36 L
POST FEV1/FVC RATIO: 88 %
PRE FEV6/FVC RATIO: 100 %
Post FEV6/FVC ratio: 100 %
Pre FEV1/FVC ratio: 90 %
RV % pred: 69 %
RV: 1.36 L
TLC % pred: 75 %
TLC: 4.19 L

## 2015-03-10 NOTE — Patient Instructions (Signed)
Follow up with pulmonary as needed 

## 2015-03-10 NOTE — Progress Notes (Signed)
PFT done today. 

## 2015-03-10 NOTE — Progress Notes (Signed)
Chief Complaint  Patient presents with  . Follow-up    Review PFT. Denies any breathing isues since last seen.     History of Present Illness: [name] is a 49 y.o. female former smoker with dyspnea, morbid obesity, and snoring.  She is here to review her PFT and Echo.  PFT showed mild restrictive defect, but otherwise normal.  Echo was unremarkable, and specifically showed normal pulmonary artery pressures.  Her chest xray from last visit was normal.  She stopped wellbutrin, and feels her breathing improved after stopping this.  She has started phentermine again.   She denies cough, wheeze, sputum, chest pain, or leg swelling.  TESTS: Echo 01/19/15 >> EF 60 to 65%, normal PA pressures PFT 03/10/15 >> FEV1 2.09 (78%), FEV1% 90, TLC 4.19 (75%), DLCO 81%, no BD   Past medical hx >> HTN, DM, HA  Past surgical hx, Medications, Allergies, Family hx, Social hx all reviewed.   Physical Exam: BP 120/80 mmHg  Pulse 75  Ht 5' 7.5" (1.715 m)  Wt 358 lb (162.388 kg)  BMI 55.21 kg/m2  SpO2 95%  General - No distress ENT - No sinus tenderness, no oral exudate, no LAN, MP 3, scalloped tongue Cardiac - s1s2 regular, no murmur Chest - No wheeze/rales/dullness Back - No focal tenderness Abd - Soft, non-tender Ext - No edema Neuro - Normal strength Skin - No rashes Psych - normal mood, and behavior   Dg Chest 2 View  12/31/2014   CLINICAL DATA:  Dyspnea  EXAM: CHEST - 2 VIEW  COMPARISON:  None.  FINDINGS: The heart size and mediastinal contours are within normal limits. Both lungs are clear. The visualized skeletal structures are unremarkable.  IMPRESSION: No active disease.   Electronically Signed   By: Inez Catalina M.D.   On: 12/31/2014 14:56    Assessment/Plan:  Dyspnea. Discussion: She feels her breathing has improved since stopping wellbutrin.  She has mild restrictive defect on her PFT with is likely related to her obesity.  No other significant abnormalities on  pulmonary testing. Plan: - continue weight loss efforts - no additional pulmonary testing needed at this time  Morbid obesity. Plan: - she has resumed phentermine  Snoring. Plan: - she would like to continue weight loss efforts and defer any sleep testing at this time  Hx of tobacco abuse. Plan: - encouraged her to maintain her smoking abstinence   Chesley Mires, MD [facility] Pager:  [phone]

## 2015-03-11 ENCOUNTER — Ambulatory Visit: Payer: Managed Care, Other (non HMO) | Admitting: Dietician

## 2015-03-24 ENCOUNTER — Telehealth: Payer: Self-pay

## 2015-03-24 ENCOUNTER — Other Ambulatory Visit: Payer: Self-pay

## 2015-03-24 MED ORDER — FLUOXETINE HCL 10 MG PO TABS: 10.0000 mg | ORAL_TABLET | Freq: Every day | ORAL | Status: DC

## 2015-03-24 NOTE — Telephone Encounter (Signed)
Patient aware and med sent to pharmacy.  

## 2015-03-24 NOTE — Telephone Encounter (Signed)
Wean off efeexor take one every other day for 2 weeks then stop altogether, start fluoxetine 10 mg one daily , OK to start today, pls send in #30 refill x 3,

## 2015-04-01 ENCOUNTER — Ambulatory Visit (INDEPENDENT_AMBULATORY_CARE_PROVIDER_SITE_OTHER): Payer: Managed Care, Other (non HMO) | Admitting: Family Medicine

## 2015-04-01 ENCOUNTER — Encounter: Payer: Self-pay | Admitting: Family Medicine

## 2015-04-01 VITALS — BP 120/82 | HR 97 | Resp 16 | Ht 67.5 in | Wt 364.0 lb

## 2015-04-01 DIAGNOSIS — H1013 Acute atopic conjunctivitis, bilateral: Secondary | ICD-10-CM | POA: Diagnosis not present

## 2015-04-01 DIAGNOSIS — F329 Major depressive disorder, single episode, unspecified: Secondary | ICD-10-CM | POA: Diagnosis not present

## 2015-04-01 DIAGNOSIS — J3089 Other allergic rhinitis: Secondary | ICD-10-CM

## 2015-04-01 DIAGNOSIS — Z1159 Encounter for screening for other viral diseases: Secondary | ICD-10-CM

## 2015-04-01 DIAGNOSIS — E1140 Type 2 diabetes mellitus with diabetic neuropathy, unspecified: Secondary | ICD-10-CM | POA: Diagnosis not present

## 2015-04-01 DIAGNOSIS — I10 Essential (primary) hypertension: Secondary | ICD-10-CM | POA: Diagnosis not present

## 2015-04-01 DIAGNOSIS — H1010 Acute atopic conjunctivitis, unspecified eye: Secondary | ICD-10-CM | POA: Insufficient documentation

## 2015-04-01 DIAGNOSIS — F4323 Adjustment disorder with mixed anxiety and depressed mood: Secondary | ICD-10-CM

## 2015-04-01 DIAGNOSIS — Z6841 Body Mass Index (BMI) 40.0 and over, adult: Secondary | ICD-10-CM

## 2015-04-01 DIAGNOSIS — F32A Depression, unspecified: Secondary | ICD-10-CM | POA: Insufficient documentation

## 2015-04-01 LAB — COMPLETE METABOLIC PANEL WITH GFR
ALT: 10 U/L (ref 6–29)
AST: 13 U/L (ref 10–35)
Albumin: 3.8 g/dL (ref 3.6–5.1)
Alkaline Phosphatase: 66 U/L (ref 33–115)
BILIRUBIN TOTAL: 0.4 mg/dL (ref 0.2–1.2)
BUN: 8 mg/dL (ref 7–25)
CALCIUM: 9.1 mg/dL (ref 8.6–10.2)
CO2: 27 mmol/L (ref 20–31)
Chloride: 104 mmol/L (ref 98–110)
Creat: 0.61 mg/dL (ref 0.50–1.10)
GFR, Est African American: >89 mL/min (ref >=60)
GFR, Est Non African American: >89 mL/min (ref >=60)
Glucose, Bld: 107 mg/dL — ABNORMAL HIGH (ref 65–99)
POTASSIUM: 4.4 mmol/L (ref 3.5–5.3)
Sodium: 141 mmol/L (ref 135–146)
TOTAL PROTEIN: 6.3 g/dL (ref 6.1–8.1)

## 2015-04-01 LAB — HEMOGLOBIN A1C
Hgb A1c MFr Bld: 7.3 % — ABNORMAL HIGH (ref <5.7)
Mean Plasma Glucose: 163 mg/dL — ABNORMAL HIGH (ref <117)

## 2015-04-01 MED ORDER — ALPRAZOLAM 0.25 MG PO TABS: ORAL_TABLET | ORAL | Status: DC

## 2015-04-01 MED ORDER — METHYLPREDNISOLONE ACETATE 80 MG/ML IJ SUSP
80.0000 mg | Freq: Once | INTRAMUSCULAR | Status: AC
  Administered 2015-04-01: 80 mg via INTRAMUSCULAR

## 2015-04-01 MED ORDER — OLOPATADINE HCL 0.1 % OP SOLN: 1.0000 [drp] | Freq: Two times a day (BID) | OPHTHALMIC | Status: DC

## 2015-04-01 MED ORDER — FLUOXETINE HCL 20 MG PO TABS: 20.0000 mg | ORAL_TABLET | Freq: Every day | ORAL | Status: DC

## 2015-04-01 NOTE — Progress Notes (Signed)
Subjective:    Patient ID: [name], female    DOB: [birth year], 49 y.o.   MRN: [id number]  HPI   [name]     MRN: [id number]      DOB: [birth year]   HPI [name] is here for follow up and re-evaluation of chronic medical conditions, medication management and review of any available recent lab and radiology data.  Preventive health is updated, specifically  Cancer screening and Immunization.   Questions or concerns regarding consultations or procedures which the PT has had in the interim are  addressed. The PT denies any adverse reactions to current medications since the last visit.  Denies polyuria, polydipsia, blurred vision , or hypoglycemic episodes. Frustrated with unsuccessful weight loss attempt C/ increased and uncontrolled allergy symptoms, itchy eyes, excessive sneezing and nasal drainage , wants a steroid shot  ROS Denies recent fever or chills. C/o  sinus pressure, nasal congestion, denies  ear pain or sore throat.c/o excess clear nasal drainage , watery eyes and sneezing Denies chest congestion, productive cough or wheezing. Denies chest pains, palpitations and leg swelling Denies abdominal pain, nausea, vomiting,diarrhea or constipation.   Denies dysuria, frequency, hesitancy or incontinence. Denies joint pain, swelling and limitation in mobility. Denies headaches, seizures, numbness, or tingling. C/o increased  Depression and  anxiety and some  Insomnia. Not suicidal or homicidal, just stressed out about life, and her weight and health Denies skin break down or rash.   PE  BP 120/82 mmHg  Pulse 97  Resp 16  Ht 5' 7.5" (1.715 m)  Wt 364 lb (165.109 kg)  BMI 56.14 kg/m2  SpO2 99%  Patient alert and oriented and in no cardiopulmonary distress.  HEENT: No facial asymmetry, EOMI,   oropharynx pink and moist.  Neck supple no JVD, no mass.Erythema and edema of nasal mucosa, excessive watery eyes , sneezing  Chest: Clear to auscultation  bilaterally.  CVS: S1, S2 no murmurs, no S3.Regular rate.  ABD: Soft non tender.   Ext: No edema  MS: Adequate ROM spine, shoulders, hips and knees.  Skin: Intact, no ulcerations or rash noted.  Psych: Good eye contact, normal affect. Memory intact not anxious mildly depressed appearing.  CNS: CN 2-12 intact, power,  normal throughout.no focal deficits noted.   Assessment & Plan  HTN, goal below 130/80 Controlled, no change in medication DASH diet and commitment to daily physical activity for a minimum of 30 minutes discussed and encouraged, as a part of hypertension management. The importance of attaining a healthy weight is also discussed.  BP/Weight 04/01/2015 03/10/2015 02/03/2015 12/31/2014 12/08/2014 10/27/2014 4/40/3474  Systolic BP 259 563 875 643 329 518 841  Diastolic BP 82 80 84 84 86 82 78  Wt. (Lbs) 364 358 364 361 360 362.12 358  BMI 56.14 55.21 57 57.4 56.37 56.7 56.06        Diabetes mellitus with neuropathy Deteriorated. No med change [name] is reminded of the importance of commitment to daily physical activity for 30 minutes or more, as able and the need to limit carbohydrate intake to 30 to 60 grams per meal to help with blood sugar control.   The need to take medication as prescribed, test blood sugar as directed, and to call between visits if there is a concern that blood sugar is uncontrolled is also discussed.   [name] is reminded of the importance of daily foot exam, annual eye examination, and good blood sugar, blood pressure and cholesterol control.  Diabetic Labs Latest Ref Rng 03/31/2015 01/30/2015 11/05/2014 08/02/2014 04/02/2014  HbA1c <5.7 % 7.3(H) 7.1(H) 7.1(H) 6.5(H) 7.1(H)  Microalbumin <2.0 mg/dL - - - 0.6 -  Micro/Creat Ratio 0.0 - 30.0 mg/g - - - 4.9 -  Chol 0 - 200 mg/dL - - 172 - 152  HDL >=46 mg/dL - - 76 - 54  Calc LDL 0 - 99 mg/dL - - 79 - 70  Triglycerides <150 mg/dL - - 83 - 140  Creatinine 0.50 - 1.10 mg/dL 0.61 0.77 0.76 0.78 -     BP/Weight 04/01/2015 03/10/2015 02/03/2015 12/31/2014 12/08/2014 10/27/2014 6/81/2751  Systolic BP 700 174 944 967 591 638 466  Diastolic BP 82 80 84 84 86 82 78  Wt. (Lbs) 364 358 364 361 360 362.12 358  BMI 56.14 55.21 57 57.4 56.37 56.7 56.06   Foot/eye exam completion dates Latest Ref Rng 02/03/2015 04/02/2014  Eye Exam No Retinopathy - -  Foot exam Order - - -  Foot Form Completion - Done Done         Morbid obesity with body mass index of 50.0-59.9 in adult Deteriorated. Patient re-educated about  the importance of commitment to a  minimum of 150 minutes of exercise per week.  The importance of healthy food choices with portion control discussed. Encouraged to start a food diary, count calories and to consider  joining a support group. Sample diet sheets offered. Goals set by the patient for the next several months.   Weight /BMI 04/01/2015 03/10/2015 02/03/2015  WEIGHT 364 lb 358 lb 364 lb  HEIGHT 5' 7.5" 5' 7.5" 5\' 7"   BMI 56.14 kg/m2 55.21 kg/m2 57 kg/m2    Current exercise per week 90 minutes.   ADJ DISORDER WITH MIXED ANXIETY & DEPRESSED MOOD Increase in fluoxetine dose for improved control Limited amount of xanax prescribed for use only when not working and extremely anxious  Allergic rhinitis Uncontrolled , depo medrol in office and patanol eye drops  Depression Increase fluoxetine dose to 20 mg daily Pt to commit to daily exercise, therapy encouraged strongly also  Allergic conjunctivitis Uncontrolled symptom, start patanol eye drops        Review of Systems     Objective:   Physical Exam        Assessment & Plan:

## 2015-04-01 NOTE — Patient Instructions (Addendum)
F/u in 3 months, call if you need me before  NEED to lose 10 pounds or more in next 3 months to continue phentermine since like all med, there is risk assocuiated with it  PLEASE again look into coverage for bariatric surgery  Reconsider flu vaccine  CONGRATS on no smoke   It is important that you exercise regularly at least 30 minutes 5 times a week. If you develop chest pain, have severe difficulty breathing, or feel very tired, stop exercising immediately and seek medical attention   PLS see dietitian and start using your gym membership every dday  INCREASE fluoxetine to 20 mg daily, OK to take TWO tablets of 10 mg till done  Xanax can be used ONLY if not working the following day  HBA1c, chem 7 and eGFR in 3 month  Patanol eye drop sent, and depo medrol in office today for uncontrolled allergies

## 2015-04-07 ENCOUNTER — Ambulatory Visit: Payer: Managed Care, Other (non HMO) | Admitting: Family Medicine

## 2015-04-07 ENCOUNTER — Other Ambulatory Visit: Payer: Self-pay | Admitting: Family Medicine

## 2015-04-07 LAB — HM DIABETES EYE EXAM

## 2015-04-13 ENCOUNTER — Ambulatory Visit: Payer: Managed Care, Other (non HMO) | Admitting: Dietician

## 2015-04-14 ENCOUNTER — Telehealth: Payer: Self-pay

## 2015-04-14 DIAGNOSIS — F329 Major depressive disorder, single episode, unspecified: Secondary | ICD-10-CM

## 2015-04-14 DIAGNOSIS — F32A Depression, unspecified: Secondary | ICD-10-CM

## 2015-04-14 NOTE — Telephone Encounter (Signed)
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

## 2015-04-15 MED ORDER — FLUOXETINE HCL 10 MG PO TABS: 10.0000 mg | ORAL_TABLET | Freq: Every day | ORAL | Status: DC

## 2015-04-15 NOTE — Telephone Encounter (Signed)
Patient states that she will try the Tylenol but would like to go back on the 10mg  strength of Fluoxetine.   New dose sent to pharmacy.

## 2015-04-15 NOTE — Addendum Note (Signed)
Addended by: Denman George B on: 04/15/2015 08:40 AM   Modules accepted: Orders

## 2015-04-25 NOTE — Assessment & Plan Note (Signed)
Increase in fluoxetine dose for improved control Limited amount of xanax prescribed for use only when not working and extremely anxious

## 2015-04-25 NOTE — Assessment & Plan Note (Signed)
Deteriorated. No med change [name] is reminded of the importance of commitment to daily physical activity for 30 minutes or more, as able and the need to limit carbohydrate intake to 30 to 60 grams per meal to help with blood sugar control.   The need to take medication as prescribed, test blood sugar as directed, and to call between visits if there is a concern that blood sugar is uncontrolled is also discussed.   [name] is reminded of the importance of daily foot exam, annual eye examination, and good blood sugar, blood pressure and cholesterol control.  Diabetic Labs Latest Ref Rng 03/31/2015 01/30/2015 11/05/2014 08/02/2014 04/02/2014  HbA1c <5.7 % 7.3(H) 7.1(H) 7.1(H) 6.5(H) 7.1(H)  Microalbumin <2.0 mg/dL - - - 0.6 -  Micro/Creat Ratio 0.0 - 30.0 mg/g - - - 4.9 -  Chol 0 - 200 mg/dL - - 172 - 152  HDL >=46 mg/dL - - 76 - 54  Calc LDL 0 - 99 mg/dL - - 79 - 70  Triglycerides <150 mg/dL - - 83 - 140  Creatinine 0.50 - 1.10 mg/dL 0.61 0.77 0.76 0.78 -   BP/Weight 04/01/2015 03/10/2015 02/03/2015 12/31/2014 12/08/2014 10/27/2014 8/91/6945  Systolic BP 038 882 800 349 179 150 569  Diastolic BP 82 80 84 84 86 82 78  Wt. (Lbs) 364 358 364 361 360 362.12 358  BMI 56.14 55.21 57 57.4 56.37 56.7 56.06   Foot/eye exam completion dates Latest Ref Rng 02/03/2015 04/02/2014  Eye Exam No Retinopathy - -  Foot exam Order - - -  Foot Form Completion - Done Done

## 2015-04-25 NOTE — Assessment & Plan Note (Signed)
Uncontrolled , depo medrol in office and patanol eye drops

## 2015-04-25 NOTE — Assessment & Plan Note (Signed)
Increase fluoxetine dose to 20 mg daily Pt to commit to daily exercise, therapy encouraged strongly also

## 2015-04-25 NOTE — Assessment & Plan Note (Signed)
Deteriorated. Patient re-educated about  the importance of commitment to a  minimum of 150 minutes of exercise per week.  The importance of healthy food choices with portion control discussed. Encouraged to start a food diary, count calories and to consider  joining a support group. Sample diet sheets offered. Goals set by the patient for the next several months.   Weight /BMI 04/01/2015 03/10/2015 02/03/2015  WEIGHT 364 lb 358 lb 364 lb  HEIGHT 5' 7.5" 5' 7.5" 5\' 7"   BMI 56.14 kg/m2 55.21 kg/m2 57 kg/m2    Current exercise per week 90 minutes.

## 2015-04-25 NOTE — Assessment & Plan Note (Signed)
Uncontrolled symptom, start patanol eye drops

## 2015-04-25 NOTE — Assessment & Plan Note (Signed)
Controlled, no change in medication DASH diet and commitment to daily physical activity for a minimum of 30 minutes discussed and encouraged, as a part of hypertension management. The importance of attaining a healthy weight is also discussed.  BP/Weight 04/01/2015 03/10/2015 02/03/2015 12/31/2014 12/08/2014 10/27/2014 7/42/5956  Systolic BP 387 564 332 951 884 166 063  Diastolic BP 82 80 84 84 86 82 78  Wt. (Lbs) 364 358 364 361 360 362.12 358  BMI 56.14 55.21 57 57.4 56.37 56.7 56.06

## 2015-05-12 ENCOUNTER — Telehealth: Payer: Self-pay

## 2015-05-12 MED ORDER — ESTROGENS CONJUGATED 0.625 MG PO TABS: 0.6250 mg | ORAL_TABLET | Freq: Every day | ORAL | Status: DC

## 2015-05-12 NOTE — Addendum Note (Signed)
Addended by: Denman George B on: 05/12/2015 11:02 AM   Modules accepted: Orders

## 2015-05-12 NOTE — Telephone Encounter (Signed)
Patient aware and medication sent to requested pharmacy.

## 2015-05-12 NOTE — Telephone Encounter (Signed)
Send premarin 0.625 mg one daily x 2 month, will discuss fuurhter at he Nov  F/u pls

## 2015-05-27 ENCOUNTER — Ambulatory Visit: Payer: Managed Care, Other (non HMO) | Admitting: Family Medicine

## 2015-06-23 ENCOUNTER — Ambulatory Visit (INDEPENDENT_AMBULATORY_CARE_PROVIDER_SITE_OTHER): Payer: Managed Care, Other (non HMO) | Admitting: Family Medicine

## 2015-06-23 ENCOUNTER — Other Ambulatory Visit: Payer: Self-pay | Admitting: Family Medicine

## 2015-06-23 ENCOUNTER — Encounter: Payer: Self-pay | Admitting: Family Medicine

## 2015-06-23 VITALS — BP 126/82 | HR 99 | Resp 18 | Ht 67.5 in | Wt 362.1 lb

## 2015-06-23 DIAGNOSIS — F329 Major depressive disorder, single episode, unspecified: Secondary | ICD-10-CM | POA: Diagnosis not present

## 2015-06-23 DIAGNOSIS — I10 Essential (primary) hypertension: Secondary | ICD-10-CM

## 2015-06-23 DIAGNOSIS — Z6841 Body Mass Index (BMI) 40.0 and over, adult: Secondary | ICD-10-CM

## 2015-06-23 DIAGNOSIS — J3089 Other allergic rhinitis: Secondary | ICD-10-CM

## 2015-06-23 DIAGNOSIS — N3001 Acute cystitis with hematuria: Secondary | ICD-10-CM

## 2015-06-23 DIAGNOSIS — N3000 Acute cystitis without hematuria: Secondary | ICD-10-CM | POA: Diagnosis not present

## 2015-06-23 DIAGNOSIS — R232 Flushing: Secondary | ICD-10-CM

## 2015-06-23 DIAGNOSIS — F4323 Adjustment disorder with mixed anxiety and depressed mood: Secondary | ICD-10-CM

## 2015-06-23 DIAGNOSIS — E1140 Type 2 diabetes mellitus with diabetic neuropathy, unspecified: Secondary | ICD-10-CM

## 2015-06-23 DIAGNOSIS — N951 Menopausal and female climacteric states: Secondary | ICD-10-CM

## 2015-06-23 DIAGNOSIS — F32A Depression, unspecified: Secondary | ICD-10-CM

## 2015-06-23 DIAGNOSIS — Z23 Encounter for immunization: Secondary | ICD-10-CM

## 2015-06-23 LAB — POCT URINALYSIS DIPSTICK
Bilirubin, UA: NEGATIVE
GLUCOSE UA: NEGATIVE
Ketones, UA: NEGATIVE
Leukocytes, UA: NEGATIVE
NITRITE UA: NEGATIVE
Protein, UA: NEGATIVE
RBC UA: NEGATIVE
Spec Grav, UA: 1.025
UROBILINOGEN UA: 0.2
pH, UA: 6

## 2015-06-23 LAB — BASIC METABOLIC PANEL
BUN: 12 mg/dL (ref 7–25)
CALCIUM: 9.4 mg/dL (ref 8.6–10.2)
CO2: 26 mmol/L (ref 20–31)
Chloride: 103 mmol/L (ref 98–110)
Creat: 0.62 mg/dL (ref 0.50–1.10)
GLUCOSE: 152 mg/dL — AB (ref 65–99)
Potassium: 4.1 mmol/L (ref 3.5–5.3)
Sodium: 139 mmol/L (ref 135–146)

## 2015-06-23 LAB — HEMOGLOBIN A1C
Hgb A1c MFr Bld: 7 % — ABNORMAL HIGH (ref <5.7)
Mean Plasma Glucose: 154 mg/dL — ABNORMAL HIGH (ref <117)

## 2015-06-23 LAB — HIV ANTIBODY (ROUTINE TESTING W REFLEX): HIV 1&2 Ab, 4th Generation: NONREACTIVE

## 2015-06-23 MED ORDER — PHENTERMINE HCL 37.5 MG PO CAPS: 37.5000 mg | ORAL_CAPSULE | ORAL | Status: DC

## 2015-06-23 MED ORDER — VENLAFAXINE HCL ER 37.5 MG PO CP24: 37.5000 mg | ORAL_CAPSULE | Freq: Every day | ORAL | Status: DC

## 2015-06-23 NOTE — Progress Notes (Signed)
Subjective:    Patient ID: [name], female    DOB: March 19, 1966, 49 y.o.   MRN: [id number]  HPI   [name]     MRN: [id number]      DOB: [birth year]   HPI [name] is here for follow up and re-evaluation of chronic medical conditions, medication management and review of any available recent lab and radiology data.  Preventive health is updated, specifically  Cancer screening and Immunization.   Questions or concerns regarding consultations or procedures which the PT has had in the interim are  addressed. Pt stopped premarin due to intolerance and now wants additional help for her hot flashes. 5 day h/o urinary frequency and dysuria, no fever , chills , hematuria or flank pain  Denies polyuria, polydipsia, blurred vision , or hypoglycemic episodes.     ROS Denies recent fever or chills. Denies sinus pressure, nasal congestion, ear pain or sore throat. Denies chest congestion, productive cough or wheezing. Denies chest pains, palpitations and leg swelling Denies abdominal pain, nausea, vomiting,diarrhea or constipation.    Denies joint pain, swelling and limitation in mobility. Denies headaches, seizures, numbness, or tingling. Denies uncontrolled depression, anxiety or insomnia. Denies skin break down or rash.   PE  BP 126/82 mmHg  Pulse 99  Resp 18  Ht 5' 7.5" (1.715 m)  Wt 362 lb 1.9 oz (164.257 kg)  BMI 55.85 kg/m2  SpO2 98%  Patient alert and oriented and in no cardiopulmonary distress.  HEENT: No facial asymmetry, EOMI,   oropharynx pink and moist.  Neck supple no JVD, no mass.  Chest: Clear to auscultation bilaterally.  CVS: S1, S2 no murmurs, no S3.Regular rate.  ABD: Soft non tender.   Ext: No edema  MS: Adequate ROM spine, shoulders, hips and knees.  Skin: Intact, no ulcerations or rash noted.  Psych: Good eye contact, normal affect. Memory intact not anxious or depressed appearing.  CNS: CN 2-12 intact, power,  normal throughout.no  focal deficits noted.   Assessment & Plan   Diabetes mellitus with neuropathy [name] is reminded of the importance of commitment to daily physical activity for 30 minutes or more, as able and the need to limit carbohydrate intake to 30 to 60 grams per meal to help with blood sugar control.   The need to take medication as prescribed, test blood sugar as directed, and to call between visits if there is a concern that blood sugar is uncontrolled is also discussed.   [name] is reminded of the importance of daily foot exam, annual eye examination, and good blood sugar, blood pressure and cholesterol control. Improved, no med change  Diabetic Labs Latest Ref Rng 06/22/2015 03/31/2015 01/30/2015 11/05/2014 08/02/2014  HbA1c <5.7 % 7.0(H) 7.3(H) 7.1(H) 7.1(H) 6.5(H)  Microalbumin <2.0 mg/dL - - - - 0.6  Micro/Creat Ratio 0.0 - 30.0 mg/g - - - - 4.9  Chol 0 - 200 mg/dL - - - 172 -  HDL >=46 mg/dL - - - 76 -  Calc LDL 0 - 99 mg/dL - - - 79 -  Triglycerides <150 mg/dL - - - 83 -  Creatinine 0.50 - 1.10 mg/dL 0.62 0.61 0.77 0.76 0.78   BP/Weight 06/23/2015 04/01/2015 03/10/2015 02/03/2015 12/31/2014 A999333 [id number]  Systolic BP 123XX123 123456 123456 [id number] 123456 [id number] [id number]  Diastolic BP 82 82 80 84 84 86 82  Wt. (Lbs) 362.12 364 358 364 361 360 362.12  BMI 55.85 56.14 55.21 57 57.4 56.37 56.7  Foot/eye exam completion dates Latest Ref Rng 04/07/2015 02/03/2015  Eye Exam No Retinopathy No Retinopathy -  Foot exam Order - - -  Foot Form Completion - - Done         Hot flashes intolerant of premarin, will add Effexor to Wellbutrin she is currently on for depression and help with smoking cessation  HTN, goal below 130/80 Controlled, no change in medication DASH diet and commitment to daily physical activity for a minimum of 30 minutes discussed and encouraged, as a part of hypertension management. The importance of attaining a healthy weight is also discussed.  BP/Weight 06/23/2015 04/01/2015 03/10/2015  02/03/2015 12/31/2014 A999333 [id number]  Systolic BP 123XX123 123456 123456 [id number] 123456 [id number]  Diastolic BP 82 82 80 84 84 86 82  Wt. (Lbs) 362.12 364 358 364 361 360 362.12  BMI 55.85 56.14 55.21 57 57.4 56.37 56.7        ADJ DISORDER WITH MIXED ANXIETY & DEPRESSED MOOD Improved, continue wellbutrin  Morbid obesity with body mass index of 50.0-59.9 in adult Unchanged. Patient re-educated about  the importance of commitment to a  minimum of 150 minutes of exercise per week.  The importance of healthy food choices with portion control discussed. Encouraged to start a food diary, count calories and to consider  joining a support group. Sample diet sheets offered. Goals set by the patient for the next several months.   Weight /BMI 06/23/2015 04/01/2015 03/10/2015  WEIGHT 362 lb 1.9 oz 364 lb 358 lb  HEIGHT 5' 7.5" 5' 7.5" 5' 7.5"  BMI 55.85 kg/m2 56.14 kg/m2 55.21 kg/m2    Current exercise per week 40 minutes  Allergic rhinitis Controlled, no change in medication   Acute cystitis with hematuria Symptomatic however normal UA, no antibioitic indicated, encouraged to increase water intake and void often      Review of Systems     Objective:   Physical Exam        Assessment & Plan:

## 2015-06-23 NOTE — Patient Instructions (Addendum)
F/u in 4 month. Call if you need me before  Fasting lipid, cmp and EGFR, hBA1C, CBC and microalb in 4 month  No infection  In urine   Depression improved, add wellbutrin  For hot flashes, and depression, no more premarin  Please work on good  health habits so that your health will improve. 1. Commitment to daily physical activity for 30 to 60  minutes, if you are able to do this.  2. Commitment to wise food choices. Aim for half of your  food intake to be vegetable and fruit, one quarter starchy foods, and one quarter protein. Try to eat on a regular schedule  3 meals per day, snacking between meals should be limited to vegetables or fruits or small portions of nuts. 64 ounces of water per day is generally recommended, unless you have specific health conditions, like heart failure or kidney failure where you will need to limit fluid intake.  3. Commitment to sufficient and a  good quality of physical and mental rest daily, generally between 6 to 8 hours per day.  WITH PERSISTANCE AND PERSEVERANCE, THE IMPOSSIBLE , BECOMES THE NORM!   Thanks for choosing [facility], we consider it a privelige to serve you.

## 2015-07-01 ENCOUNTER — Ambulatory Visit: Payer: Managed Care, Other (non HMO) | Admitting: Family Medicine

## 2015-07-07 ENCOUNTER — Telehealth: Payer: Self-pay | Admitting: Family Medicine

## 2015-07-07 NOTE — Telephone Encounter (Signed)
Patient is asking if the dosage of the venlafaxine XR (EFFEXOR XR) 37.5 MG 24 hr capsule  Can be decreased?

## 2015-07-08 NOTE — Telephone Encounter (Signed)
Aware she is on the lowest dose. States she will stop it because its making her neck stiff. Will call back if neck problem continues

## 2015-08-04 ENCOUNTER — Other Ambulatory Visit: Payer: Self-pay | Admitting: Family Medicine

## 2015-08-23 ENCOUNTER — Other Ambulatory Visit: Payer: Self-pay | Admitting: Family Medicine

## 2015-09-06 DIAGNOSIS — N3000 Acute cystitis without hematuria: Secondary | ICD-10-CM | POA: Insufficient documentation

## 2015-09-06 DIAGNOSIS — N3001 Acute cystitis with hematuria: Secondary | ICD-10-CM | POA: Insufficient documentation

## 2015-09-06 NOTE — Assessment & Plan Note (Signed)
[name] is reminded of the importance of commitment to daily physical activity for 30 minutes or more, as able and the need to limit carbohydrate intake to 30 to 60 grams per meal to help with blood sugar control.   The need to take medication as prescribed, test blood sugar as directed, and to call between visits if there is a concern that blood sugar is uncontrolled is also discussed.   [name] is reminded of the importance of daily foot exam, annual eye examination, and good blood sugar, blood pressure and cholesterol control. Improved, no med change  Diabetic Labs Latest Ref Rng 06/22/2015 03/31/2015 01/30/2015 11/05/2014 08/02/2014  HbA1c <5.7 % 7.0(H) 7.3(H) 7.1(H) 7.1(H) 6.5(H)  Microalbumin <2.0 mg/dL - - - - 0.6  Micro/Creat Ratio 0.0 - 30.0 mg/g - - - - 4.9  Chol 0 - 200 mg/dL - - - 172 -  HDL >=46 mg/dL - - - 76 -  Calc LDL 0 - 99 mg/dL - - - 79 -  Triglycerides <150 mg/dL - - - 83 -  Creatinine 0.50 - 1.10 mg/dL 0.62 0.61 0.77 0.76 0.78   BP/Weight 06/23/2015 04/01/2015 03/10/2015 02/03/2015 12/31/2014 A999333 [id number]  Systolic BP 123XX123 123456 123456 [id number] 123456 [id number]  Diastolic BP 82 82 80 84 84 86 82  Wt. (Lbs) 362.12 364 358 364 361 360 362.12  BMI 55.85 56.14 55.21 57 57.4 56.37 56.7   Foot/eye exam completion dates Latest Ref Rng 04/07/2015 02/03/2015  Eye Exam No Retinopathy No Retinopathy -  Foot exam Order - - -  Foot Form Completion - - Done

## 2015-09-06 NOTE — Assessment & Plan Note (Signed)
Symptomatic however normal UA, no antibioitic indicated, encouraged to increase water intake and void often

## 2015-09-06 NOTE — Assessment & Plan Note (Signed)
Controlled, no change in medication  

## 2015-09-06 NOTE — Assessment & Plan Note (Signed)
Controlled, no change in medication DASH diet and commitment to daily physical activity for a minimum of 30 minutes discussed and encouraged, as a part of hypertension management. The importance of attaining a healthy weight is also discussed.  BP/Weight 06/23/2015 04/01/2015 03/10/2015 02/03/2015 12/31/2014 A999333 [id number]  Systolic BP 123XX123 123456 123456 [id number] [id number] [id number]  Diastolic BP 82 82 80 84 84 86 82  Wt. (Lbs) 362.12 364 358 364 361 360 362.12  BMI 55.85 56.14 55.21 57 57.4 56.37 56.7

## 2015-09-06 NOTE — Assessment & Plan Note (Signed)
Unchanged. Patient re-educated about  the importance of commitment to a  minimum of 150 minutes of exercise per week.  The importance of healthy food choices with portion control discussed. Encouraged to start a food diary, count calories and to consider  joining a support group. Sample diet sheets offered. Goals set by the patient for the next several months.   Weight /BMI 06/23/2015 04/01/2015 03/10/2015  WEIGHT 362 lb 1.9 oz 364 lb 358 lb  HEIGHT 5' 7.5" 5' 7.5" 5' 7.5"  BMI 55.85 kg/m2 56.14 kg/m2 55.21 kg/m2    Current exercise per week 40 minutes

## 2015-09-06 NOTE — Assessment & Plan Note (Signed)
Improved, continue wellbutrin

## 2015-09-06 NOTE — Assessment & Plan Note (Signed)
intolerant of premarin, will add Effexor to Wellbutrin she is currently on for depression and help with smoking cessation

## 2015-10-16 ENCOUNTER — Telehealth: Payer: Self-pay | Admitting: Family Medicine

## 2015-10-16 NOTE — Telephone Encounter (Signed)
Opened in error

## 2015-10-17 LAB — CBC
HCT: 41.7 % (ref 36.0–46.0)
HEMOGLOBIN: 12.4 g/dL (ref 12.0–15.0)
MCH: 21.6 pg — ABNORMAL LOW (ref 26.0–34.0)
MCHC: 29.7 g/dL — AB (ref 30.0–36.0)
MCV: 72.6 fL — ABNORMAL LOW (ref 78.0–100.0)
MPV: 9.5 fL (ref 8.6–12.4)
PLATELETS: 272 10*3/uL (ref 150–400)
RBC: 5.74 MIL/uL — AB (ref 3.87–5.11)
RDW: 16 % — ABNORMAL HIGH (ref 11.5–15.5)
WBC: 7.1 10*3/uL (ref 4.0–10.5)

## 2015-10-17 LAB — COMPLETE METABOLIC PANEL WITH GFR
ALBUMIN: 3.9 g/dL (ref 3.6–5.1)
ALK PHOS: 71 U/L (ref 33–130)
ALT: 11 U/L (ref 6–29)
AST: 13 U/L (ref 10–35)
BILIRUBIN TOTAL: 0.4 mg/dL (ref 0.2–1.2)
BUN: 14 mg/dL (ref 7–25)
CALCIUM: 8.8 mg/dL (ref 8.6–10.4)
CO2: 27 mmol/L (ref 20–31)
Chloride: 104 mmol/L (ref 98–110)
Creat: 0.74 mg/dL (ref 0.50–1.05)
GFR, Est African American: >89 mL/min (ref >=60)
Glucose, Bld: 110 mg/dL — ABNORMAL HIGH (ref 65–99)
POTASSIUM: 4.3 mmol/L (ref 3.5–5.3)
Sodium: 141 mmol/L (ref 135–146)
Total Protein: 6.4 g/dL (ref 6.1–8.1)

## 2015-10-17 LAB — LIPID PANEL
CHOL/HDL RATIO: 2.5 ratio (ref <=5.0)
CHOLESTEROL: 162 mg/dL (ref 125–200)
HDL: 64 mg/dL (ref >=46)
LDL Cholesterol: 71 mg/dL (ref <130)
TRIGLYCERIDES: 135 mg/dL (ref <150)
VLDL: 27 mg/dL (ref <30)

## 2015-10-18 LAB — HEMOGLOBIN A1C
Hgb A1c MFr Bld: 6.9 % — ABNORMAL HIGH (ref <5.7)
MEAN PLASMA GLUCOSE: 151 mg/dL — AB (ref <117)

## 2015-10-19 LAB — FERRITIN: FERRITIN: 50 ng/mL (ref 10–232)

## 2015-10-19 LAB — IRON: IRON: 78 ug/dL (ref 45–160)

## 2015-10-20 LAB — MICROALBUMIN / CREATININE URINE RATIO
CREATININE, URINE: 194 mg/dL (ref 20–320)
MICROALB UR: 1.6 mg/dL
Microalb Creat Ratio: 8 mcg/mg creat (ref <30)

## 2015-10-21 ENCOUNTER — Encounter: Payer: Self-pay | Admitting: Family Medicine

## 2015-10-21 ENCOUNTER — Ambulatory Visit (INDEPENDENT_AMBULATORY_CARE_PROVIDER_SITE_OTHER): Payer: Managed Care, Other (non HMO) | Admitting: Family Medicine

## 2015-10-21 ENCOUNTER — Encounter: Payer: Self-pay | Admitting: Gastroenterology

## 2015-10-21 VITALS — BP 120/82 | HR 93 | Resp 16 | Ht 68.0 in | Wt 359.0 lb

## 2015-10-21 DIAGNOSIS — F32A Depression, unspecified: Secondary | ICD-10-CM

## 2015-10-21 DIAGNOSIS — J3089 Other allergic rhinitis: Secondary | ICD-10-CM | POA: Diagnosis not present

## 2015-10-21 DIAGNOSIS — E0840 Diabetes mellitus due to underlying condition with diabetic neuropathy, unspecified: Secondary | ICD-10-CM | POA: Diagnosis not present

## 2015-10-21 DIAGNOSIS — Z6841 Body Mass Index (BMI) 40.0 and over, adult: Secondary | ICD-10-CM

## 2015-10-21 DIAGNOSIS — Z1211 Encounter for screening for malignant neoplasm of colon: Secondary | ICD-10-CM | POA: Diagnosis not present

## 2015-10-21 DIAGNOSIS — F329 Major depressive disorder, single episode, unspecified: Secondary | ICD-10-CM

## 2015-10-21 DIAGNOSIS — I10 Essential (primary) hypertension: Secondary | ICD-10-CM

## 2015-10-21 MED ORDER — LORATADINE 10 MG PO TABS: 10.0000 mg | ORAL_TABLET | Freq: Every day | ORAL | Status: AC

## 2015-10-21 MED ORDER — PHENTERMINE HCL 37.5 MG PO TABS: 37.5000 mg | ORAL_TABLET | Freq: Every day | ORAL | Status: DC

## 2015-10-21 NOTE — Progress Notes (Signed)
Subjective:    Patient ID: [name], female    DOB: October 27, 1965, 50 y.o.   MRN: [id number]  HPI    [name]     MRN: [id number]      DOB: 1965/12/30   HPI [name] is here for follow up and re-evaluation of chronic medical conditions, medication management and review of any available recent lab and radiology data.  Preventive health is updated, specifically  Cancer screening and Immunization.   Questions or concerns regarding consultations or procedures which the PT has had in the interim are  addressed. The PT denies any adverse reactions to current medications since the last visit.  There are no new concerns. Plans to be successful with weight loss through diet , exercise and with help of medication. There are no specific complaints  Denies polyuria, polydipsia, blurred vision , or hypoglycemic episodes.   ROS Denies recent fever or chills. Denies sinus pressure, nasal congestion, ear pain or sore throat. Denies chest congestion, productive cough or wheezing. Denies chest pains, palpitations and leg swelling Denies abdominal pain, nausea, vomiting,diarrhea or constipation.   Denies dysuria, frequency, hesitancy or incontinence. Denies joint pain, swelling and limitation in mobility. Denies headaches, seizures, numbness, or tingling. Denies depression, anxiety or insomnia. Denies skin break down or rash.   PE  BP 120/82 mmHg  Pulse 93  Resp 16  Ht 5\' 8"  (1.727 m)  Wt 359 lb (162.841 kg)  BMI 54.60 kg/m2  SpO2 96%  Patient alert and oriented and in no cardiopulmonary distress.  HEENT: No facial asymmetry, EOMI,   oropharynx pink and moist.  Neck supple no JVD, no mass.  Chest: Clear to auscultation bilaterally.  CVS: S1, S2 no murmurs, no S3.Regular rate.  ABD: Soft non tender.   Ext: No edema  MS: Adequate ROM spine, shoulders, hips and knees.  Skin: Intact, no ulcerations or rash noted.  Psych: Good eye contact, normal affect. Memory intact  not anxious or depressed appearing.  CNS: CN 2-12 intact, power,  normal throughout.no focal deficits noted.   Assessment & Plan  HTN, goal below 130/80 Controlled, no change in medication DASH diet and commitment to daily physical activity for a minimum of 30 minutes discussed and encouraged, as a part of hypertension management. The importance of attaining a healthy weight is also discussed.  BP/Weight 10/21/2015 06/23/2015 04/01/2015 03/10/2015 02/03/2015 12/31/2014 [id number]  Systolic BP 123456 [id number] 123456 123456 [id number]  Diastolic BP 82 82 82 80 84 84 86  Wt. (Lbs) 359 362.12 364 358 364 361 360  BMI 54.6 55.85 56.14 55.21 57 57.4 56.37        Allergic rhinitis Increased with season change, however , adequately controlled  Diabetes mellitus with neuropathy Controlled, no change in medication [name] is reminded of the importance of commitment to daily physical activity for 30 minutes or more, as able and the need to limit carbohydrate intake to 30 to 60 grams per meal to help with blood sugar control.   The need to take medication as prescribed, test blood sugar as directed, and to call between visits if there is a concern that blood sugar is uncontrolled is also discussed.   [name] is reminded of the importance of daily foot exam, annual eye examination, and good blood sugar, blood pressure and cholesterol control.  Diabetic Labs Latest Ref Rng 06/23/2015 06/22/2015 03/31/2015 01/30/2015 11/05/2014  HbA1c <5.7 % 6.9(H) 7.0(H) 7.3(H) 7.1(H) 7.1(H)  Microalbumin Not estab mg/dL 1.6 - - - -  Micro/Creat Ratio <30 mcg/mg creat 8 - - - -  Chol 125 - 200 mg/dL 162 - - - 172  HDL >=46 mg/dL 64 - - - 76  Calc LDL <130 mg/dL 71 - - - 79  Triglycerides <150 mg/dL 135 - - - 83  Creatinine 0.50 - 1.05 mg/dL 0.74 0.62 0.61 0.77 0.76   BP/Weight 10/21/2015 06/23/2015 04/01/2015 03/10/2015 02/03/2015 12/31/2014 [id number]  Systolic BP 123456 [id number] 123456 123456 [id number] 123456 [id number]  Diastolic BP 82 82 82 80 84 84 86    Wt. (Lbs) 359 362.12 364 358 364 361 360  BMI 54.6 55.85 56.14 55.21 57 57.4 56.37   Foot/eye exam completion dates Latest Ref Rng 04/07/2015 02/03/2015  Eye Exam No Retinopathy No Retinopathy -  Foot exam Order - - -  Foot Form Completion - - Done         Allergic conjunctivitis    Morbid obesity with body mass index of 50.0-59.9 in adult Deteriorated. Patient re-educated about  the importance of commitment to a  minimum of 150 minutes of exercise per week.  The importance of healthy food choices with portion control discussed. Encouraged to start a food diary, count calories and to consider  joining a support group. Sample diet sheets offered. Goals set by the patient for the next several months.   Weight /BMI 10/21/2015 06/23/2015 04/01/2015  WEIGHT 359 lb 362 lb 1.9 oz 364 lb  HEIGHT 5\' 8"  5' 7.5" 5' 7.5"  BMI 54.6 kg/m2 55.85 kg/m2 56.14 kg/m2    Current exercise per week 60 mins       Review of Systems     Objective:   Physical Exam        Assessment & Plan:

## 2015-10-21 NOTE — Patient Instructions (Addendum)
F/u in 3.5 months, call if you need me before  1500 and 1800 cal diet  sheets   You are referred for colonoscopy   Excellent labs and blood pressure  Increase in fluoxetine to 10 mg two daily , call in 1 week if helps and you tolerate for hot flashes  Please work on good  health habits so that your health will improve. 1. Commitment to daily physical activity for 30 to 60  minutes, if you are able to do this.  2. Commitment to wise food choices. Aim for half of your  food intake to be vegetable and fruit, one quarter starchy foods, and one quarter protein. Try to eat on a regular schedule  3 meals per day, snacking between meals should be limited to vegetables or fruits or small portions of nuts. 64 ounces of water per day is generally recommended, unless you have specific health conditions, like heart failure or kidney failure where you will need to limit fluid intake.  3. Commitment to sufficient and a  good quality of physical and mental rest daily, generally between 6 to 8 hours per day.  WITH PERSISTANCE AND PERSEVERANCE, THE IMPOSSIBLE , BECOMES THE NORM!

## 2015-10-26 NOTE — Assessment & Plan Note (Signed)
Increased with season change, however , adequately controlled

## 2015-10-26 NOTE — Assessment & Plan Note (Signed)
Controlled, no change in medication DASH diet and commitment to daily physical activity for a minimum of 30 minutes discussed and encouraged, as a part of hypertension management. The importance of attaining a healthy weight is also discussed.  BP/Weight 10/21/2015 06/23/2015 04/01/2015 03/10/2015 02/03/2015 12/31/2014 [id number]  Systolic BP 123456 [id number] 123456 123456 [id number] 123456 [id number]  Diastolic BP 82 82 82 80 84 84 86  Wt. (Lbs) 359 362.12 364 358 364 361 360  BMI 54.6 55.85 56.14 55.21 57 57.4 56.37

## 2015-10-26 NOTE — Assessment & Plan Note (Signed)
Deteriorated. Patient re-educated about  the importance of commitment to a  minimum of 150 minutes of exercise per week.  The importance of healthy food choices with portion control discussed. Encouraged to start a food diary, count calories and to consider  joining a support group. Sample diet sheets offered. Goals set by the patient for the next several months.   Weight /BMI 10/21/2015 06/23/2015 04/01/2015  WEIGHT 359 lb 362 lb 1.9 oz 364 lb  HEIGHT 5\' 8"  5' 7.5" 5' 7.5"  BMI 54.6 kg/m2 55.85 kg/m2 56.14 kg/m2    Current exercise per week 60 mins

## 2015-10-26 NOTE — Assessment & Plan Note (Signed)
Controlled, no change in medication [name] is reminded of the importance of commitment to daily physical activity for 30 minutes or more, as able and the need to limit carbohydrate intake to 30 to 60 grams per meal to help with blood sugar control.   The need to take medication as prescribed, test blood sugar as directed, and to call between visits if there is a concern that blood sugar is uncontrolled is also discussed.   [name] is reminded of the importance of daily foot exam, annual eye examination, and good blood sugar, blood pressure and cholesterol control.  Diabetic Labs Latest Ref Rng 06/23/2015 06/22/2015 03/31/2015 01/30/2015 11/05/2014  HbA1c <5.7 % 6.9(H) 7.0(H) 7.3(H) 7.1(H) 7.1(H)  Microalbumin Not estab mg/dL 1.6 - - - -  Micro/Creat Ratio <30 mcg/mg creat 8 - - - -  Chol 125 - 200 mg/dL 162 - - - 172  HDL >=46 mg/dL 64 - - - 76  Calc LDL <130 mg/dL 71 - - - 79  Triglycerides <150 mg/dL 135 - - - 83  Creatinine 0.50 - 1.05 mg/dL 0.74 0.62 0.61 0.77 0.76   BP/Weight 10/21/2015 06/23/2015 04/01/2015 03/10/2015 02/03/2015 12/31/2014 [id number]  Systolic BP 123456 [id number] 123456 123456 [id number] 123456 [id number]  Diastolic BP 82 82 82 80 84 84 86  Wt. (Lbs) 359 362.12 364 358 364 361 360  BMI 54.6 55.85 56.14 55.21 57 57.4 56.37   Foot/eye exam completion dates Latest Ref Rng 04/07/2015 02/03/2015  Eye Exam No Retinopathy No Retinopathy -  Foot exam Order - - -  Foot Form Completion - - Done

## 2015-11-16 ENCOUNTER — Other Ambulatory Visit: Payer: Self-pay | Admitting: Family Medicine

## 2015-11-29 ENCOUNTER — Other Ambulatory Visit: Payer: Self-pay | Admitting: Family Medicine

## 2015-12-14 ENCOUNTER — Telehealth: Payer: Self-pay | Admitting: Family Medicine

## 2015-12-14 NOTE — Telephone Encounter (Signed)
Last refill given was for tablets.   Patient will need to address with pharmacy.  Will send fax to pharmacy with request as well.

## 2015-12-14 NOTE — Telephone Encounter (Signed)
Patient is asking that the refill be in tablet form not capsule form for phentermine (ADIPEX-P) 37.5 MG tablet  , please advise?

## 2015-12-16 ENCOUNTER — Other Ambulatory Visit: Payer: Self-pay | Admitting: Family Medicine

## 2015-12-16 ENCOUNTER — Other Ambulatory Visit: Payer: Self-pay

## 2015-12-16 DIAGNOSIS — F32A Depression, unspecified: Secondary | ICD-10-CM

## 2015-12-16 DIAGNOSIS — F329 Major depressive disorder, single episode, unspecified: Secondary | ICD-10-CM

## 2015-12-16 MED ORDER — FLUOXETINE HCL 10 MG PO TABS: 10.0000 mg | ORAL_TABLET | Freq: Every day | ORAL | Status: DC

## 2015-12-18 ENCOUNTER — Ambulatory Visit (AMBULATORY_SURGERY_CENTER): Payer: Managed Care, Other (non HMO) | Admitting: *Deleted

## 2015-12-18 ENCOUNTER — Telehealth: Payer: Self-pay | Admitting: *Deleted

## 2015-12-18 VITALS — Ht 66.0 in | Wt 368.6 lb

## 2015-12-18 DIAGNOSIS — Z1211 Encounter for screening for malignant neoplasm of colon: Secondary | ICD-10-CM

## 2015-12-18 MED ORDER — NA SULFATE-K SULFATE-MG SULF 17.5-3.13-1.6 GM/177ML PO SOLN: ORAL | Status: DC

## 2015-12-18 NOTE — Progress Notes (Signed)
No allergies to eggs or soy. No problems with anesthesia.  Pt given Emmi instructions for colonoscopy  No oxygen use  Pt takes Phentermine; instructed to stop 10 days before procedure

## 2015-12-18 NOTE — Telephone Encounter (Signed)
Dr Silverio Decamp: pt came for [facility] today for screening colonoscopy scheduled at [facility] for 01/01/16.  Pt has BMI of 59.52 and weight of 368.6lb.  Please review chart; is pt okay for direct hospital colonoscopy or does she need OV with you before scheduling colonoscopy?  Thanks, Juliann Pulse in [facility]

## 2015-12-19 NOTE — Telephone Encounter (Signed)
Please schedule OV next available. Thanks

## 2015-12-22 ENCOUNTER — Telehealth: Payer: Self-pay

## 2015-12-22 NOTE — Telephone Encounter (Signed)
[employer] [facility] told her that due to her weight her colonoscopy would have to be done in the hospital and she is concerned that it will be more expensive and she wants to know if there is someone else you can refer her to where she can get it done outside of the hospital. If not she isn't sure she even wants to have it done. Please advise

## 2015-12-22 NOTE — Telephone Encounter (Signed)
No other known options , pls advise her this is still best test to screen for colon cancer

## 2015-12-22 NOTE — Telephone Encounter (Signed)
Spoke with pt and OV made for 02-23-16 at 1000

## 2015-12-23 ENCOUNTER — Other Ambulatory Visit: Payer: Self-pay

## 2015-12-23 ENCOUNTER — Telehealth: Payer: Self-pay

## 2015-12-23 DIAGNOSIS — M62838 Other muscle spasm: Secondary | ICD-10-CM

## 2015-12-23 MED ORDER — CYCLOBENZAPRINE HCL 5 MG PO TABS: ORAL_TABLET | ORAL | Status: DC

## 2015-12-23 NOTE — Telephone Encounter (Signed)
Patient aware.

## 2015-12-23 NOTE — Telephone Encounter (Signed)
Ketamine cream available at [facility] is recommended twice daily, the other creams have anti inflammatories and she is allergic

## 2015-12-23 NOTE — Telephone Encounter (Signed)
Patient aware and med sent to [facility]

## 2015-12-23 NOTE — Telephone Encounter (Signed)
wants a pain cream called in for her leg pain. Has been using bengay with no relief. Please advise

## 2015-12-23 NOTE — Telephone Encounter (Signed)
pls refill x 2 and let her know

## 2015-12-23 NOTE — Telephone Encounter (Signed)
Medication refilled

## 2016-01-01 ENCOUNTER — Encounter: Payer: Managed Care, Other (non HMO) | Admitting: Gastroenterology

## 2016-01-27 ENCOUNTER — Telehealth: Payer: Self-pay

## 2016-01-27 DIAGNOSIS — E0840 Diabetes mellitus due to underlying condition with diabetic neuropathy, unspecified: Secondary | ICD-10-CM

## 2016-01-27 DIAGNOSIS — I10 Essential (primary) hypertension: Secondary | ICD-10-CM

## 2016-01-27 LAB — COMPLETE METABOLIC PANEL WITH GFR
ALK PHOS: 89 U/L (ref 33–130)
ALT: 12 U/L (ref 6–29)
AST: 15 U/L (ref 10–35)
Albumin: 4 g/dL (ref 3.6–5.1)
BUN: 14 mg/dL (ref 7–25)
CO2: 24 mmol/L (ref 20–31)
Calcium: 9.8 mg/dL (ref 8.6–10.4)
Chloride: 102 mmol/L (ref 98–110)
Creat: 0.72 mg/dL (ref 0.50–1.05)
GFR, Est African American: >89 mL/min (ref >=60)
GLUCOSE: 102 mg/dL — AB (ref 65–99)
POTASSIUM: 4.3 mmol/L (ref 3.5–5.3)
SODIUM: 138 mmol/L (ref 135–146)
Total Bilirubin: 0.2 mg/dL (ref 0.2–1.2)
Total Protein: 6.9 g/dL (ref 6.1–8.1)

## 2016-01-27 LAB — HEMOGLOBIN A1C
Hgb A1c MFr Bld: 7.2 % — ABNORMAL HIGH (ref <5.7)
MEAN PLASMA GLUCOSE: 160 mg/dL

## 2016-01-27 NOTE — Telephone Encounter (Signed)
Labs ordered prior to appt

## 2016-02-01 ENCOUNTER — Encounter: Payer: Self-pay | Admitting: Family Medicine

## 2016-02-01 ENCOUNTER — Ambulatory Visit (INDEPENDENT_AMBULATORY_CARE_PROVIDER_SITE_OTHER): Payer: Managed Care, Other (non HMO) | Admitting: Family Medicine

## 2016-02-01 VITALS — BP 118/80 | HR 102 | Temp 98.8°F | Resp 16 | Ht 66.0 in | Wt 363.0 lb

## 2016-02-01 DIAGNOSIS — Z13220 Encounter for screening for lipoid disorders: Secondary | ICD-10-CM

## 2016-02-01 DIAGNOSIS — E0840 Diabetes mellitus due to underlying condition with diabetic neuropathy, unspecified: Secondary | ICD-10-CM | POA: Diagnosis not present

## 2016-02-01 DIAGNOSIS — I10 Essential (primary) hypertension: Secondary | ICD-10-CM

## 2016-02-01 DIAGNOSIS — R232 Flushing: Secondary | ICD-10-CM

## 2016-02-01 DIAGNOSIS — Z6841 Body Mass Index (BMI) 40.0 and over, adult: Secondary | ICD-10-CM

## 2016-02-01 DIAGNOSIS — N951 Menopausal and female climacteric states: Secondary | ICD-10-CM

## 2016-02-01 DIAGNOSIS — J3089 Other allergic rhinitis: Secondary | ICD-10-CM

## 2016-02-01 MED ORDER — NORETHINDRONE-ETH ESTRADIOL 1-5 MG-MCG PO TABS: 1.0000 | ORAL_TABLET | Freq: Every day | ORAL | Status: DC

## 2016-02-01 MED ORDER — PHENTERMINE HCL 37.5 MG PO TABS: 37.5000 mg | ORAL_TABLET | Freq: Every day | ORAL | Status: DC

## 2016-02-01 MED ORDER — FLUTICASONE PROPIONATE 50 MCG/ACT NA SUSP: NASAL | Status: AC

## 2016-02-01 NOTE — Assessment & Plan Note (Signed)
Improved. Pt applauded on succesful weight loss through lifestyle change, and encouraged to continue same. Weight loss goal set for the next several months.  

## 2016-02-01 NOTE — Patient Instructions (Signed)
Physical exam with pap , first week in December, call if you need me before  NEED to use flonase every day for sinuses, also saline flushes  Contine all medications as before  I will send in prior hormone treatment that worked for hot flashes  Fasting Lipid, hBA1C, TSH , cmp and eGFR nov 29/2017  Foot exam today is good  Continue aerobics and start learning top swim

## 2016-02-01 NOTE — Progress Notes (Signed)
[name]     MRN: [id number]      DOB: [birth year]   HPI [name] is here for follow up and re-evaluation of chronic medical conditions, medication management and review of any available recent lab and radiology data.  Preventive health is updated, specifically  Cancer screening and Immunization.   Questions or concerns regarding consultations or procedures which the PT has had in the interim are  addressed. The PT denies any adverse reactions to current medications since the last visit.  Facial pressure , clear drainage , no fevr or chills, needs to resume medication Excessive hot flashes, negatively impacting her life and health   ROS Denies recent fever or chills. Denies sinus pressure, nasal congestion, ear pain or sore throat. Denies chest congestion, productive cough or wheezing. Denies chest pains, palpitations and leg swelling Denies abdominal pain, nausea, vomiting,diarrhea or constipation.   Denies dysuria, frequency, hesitancy or incontinence. Got injection in knees beneficial, now started water aerobics. Denies headaches, seizures, numbness, or tingling. Denies depression, anxiety , c/o insomnia due to excess hot flashes, nothing other than hormonal therapy worked for her, understands the low  Risks and wants to pursure this option due to very poor quality of life  Denies skin break down or rash.   PE  BP 118/80 mmHg  Pulse 102  Temp(Src) 98.8 F (37.1 C) (Oral)  Resp 16  Ht 5\' 6"  (1.676 m)  Wt 363 lb (164.656 kg)  BMI 58.62 kg/m2  SpO2 96%  Patient alert and oriented and in no cardiopulmonary distress.  HEENT: No facial asymmetry, EOMI,   oropharynx pink and moist.  Neck supple no JVD, no mass.  Chest: Clear to auscultation bilaterally.  CVS: S1, S2 no murmurs, no S3.Regular rate.  ABD: Soft non tender.   Ext: No edema  MS: Adequate ROM spine, shoulders, hips and knees.  Skin: Intact, no ulcerations or rash noted.  Psych: Good eye contact,  normal affect. Memory intact not anxious or depressed appearing.  CNS: CN 2-12 intact, power,  normal throughout.no focal deficits noted.   Assessment & Plan  HTN, goal below 130/80 Controlled, no change in medication DASH diet and commitment to daily physical activity for a minimum of 30 minutes discussed and encouraged, as a part of hypertension management. The importance of attaining a healthy weight is also discussed.  BP/Weight 02/01/2016 12/18/2015 10/21/2015 06/23/2015 04/01/2015 03/10/2015 [id number]  Systolic BP 123456 - 123456 [id number] 123456 123456 [id number]  Diastolic BP 80 - 82 82 82 80 84  Wt. (Lbs) 363 368.6 359 362.12 364 358 364  BMI 58.62 59.52 54.6 55.85 56.14 55.21 57        Diabetes mellitus with neuropathy [name] is reminded of the importance of commitment to daily physical activity for 30 minutes or more, as able and the need to limit carbohydrate intake to 30 to 60 grams per meal to help with blood sugar control.   The need to take medication as prescribed, test blood sugar as directed, and to call between visits if there is a concern that blood sugar is uncontrolled is also discussed.   [name] is reminded of the importance of daily foot exam, annual eye examination, and good blood sugar, blood pressure and cholesterol control. Deteriorated  Diabetic Labs Latest Ref Rng 01/27/2016 06/23/2015 06/22/2015 03/31/2015 01/30/2015  HbA1c <5.7 % 7.2(H) 6.9(H) 7.0(H) 7.3(H) 7.1(H)  Microalbumin Not estab mg/dL - 1.6 - - -  Micro/Creat Ratio <30 mcg/mg creat - 8 - - -  Chol 125 - 200 mg/dL - 162 - - -  HDL >=46 mg/dL - 64 - - -  Calc LDL <130 mg/dL - 71 - - -  Triglycerides <150 mg/dL - 135 - - -  Creatinine 0.50 - 1.05 mg/dL 0.72 0.74 0.62 0.61 0.77   BP/Weight 02/01/2016 12/18/2015 10/21/2015 06/23/2015 04/01/2015 03/10/2015 [id number]  Systolic BP 123456 - 123456 [id number] 123456 123456 [id number]  Diastolic BP 80 - 82 82 82 80 84  Wt. (Lbs) 363 368.6 359 362.12 364 358 364  BMI 58.62 59.52 54.6 55.85 56.14 55.21  57   Foot/eye exam completion dates Latest Ref Rng 02/01/2016 04/07/2015  Eye Exam No Retinopathy - No Retinopathy  Foot Form Completion - Done -   Deteriorated Updated lab needed at/ before next visit.      Morbid obesity with body mass index of 50.0-59.9 in adult Improved, 10 pound weight loss goal established Patient re-educated about  the importance of commitment to a  minimum of 150 minutes of exercise per week.  The importance of healthy food choices with portion control discussed. Encouraged to start a food diary, count calories and to consider  joining a support group. Sample diet sheets offered. Goals set by the patient for the next several months.   Weight /BMI 02/01/2016 12/18/2015 10/21/2015  WEIGHT 363 lb 368 lb 9.6 oz 359 lb  HEIGHT 5\' 6"  5\' 6"  5\' 8"   BMI 58.62 kg/m2 59.52 kg/m2 54.6 kg/m2       Morbid obesity Improved. Pt applauded on succesful weight loss through lifestyle change, and encouraged to continue same. Weight loss goal set for the next several months.   Hot flashes Uncontrolled, has failed all therapy except OCP , will prescribe same short term, encourage weaning off within a 6 to 12 month period Pt aware of fact that some breast cancers are estrogen dependent hence long term estrogen replacement therapy not recommended

## 2016-02-01 NOTE — Assessment & Plan Note (Addendum)
[name] is reminded of the importance of commitment to daily physical activity for 30 minutes or more, as able and the need to limit carbohydrate intake to 30 to 60 grams per meal to help with blood sugar control.   The need to take medication as prescribed, test blood sugar as directed, and to call between visits if there is a concern that blood sugar is uncontrolled is also discussed.   [name] is reminded of the importance of daily foot exam, annual eye examination, and good blood sugar, blood pressure and cholesterol control. Deteriorated  Diabetic Labs Latest Ref Rng 01/27/2016 06/23/2015 06/22/2015 03/31/2015 01/30/2015  HbA1c <5.7 % 7.2(H) 6.9(H) 7.0(H) 7.3(H) 7.1(H)  Microalbumin Not estab mg/dL - 1.6 - - -  Micro/Creat Ratio <30 mcg/mg creat - 8 - - -  Chol 125 - 200 mg/dL - 162 - - -  HDL >=46 mg/dL - 64 - - -  Calc LDL <130 mg/dL - 71 - - -  Triglycerides <150 mg/dL - 135 - - -  Creatinine 0.50 - 1.05 mg/dL 0.72 0.74 0.62 0.61 0.77   BP/Weight 02/01/2016 12/18/2015 10/21/2015 06/23/2015 04/01/2015 03/10/2015 [id number]  Systolic BP 123456 - 123456 [id number] 123456 123456 [id number]  Diastolic BP 80 - 82 82 82 80 84  Wt. (Lbs) 363 368.6 359 362.12 364 358 364  BMI 58.62 59.52 54.6 55.85 56.14 55.21 57   Foot/eye exam completion dates Latest Ref Rng 02/01/2016 04/07/2015  Eye Exam No Retinopathy - No Retinopathy  Foot Form Completion - Done -   Deteriorated Updated lab needed at/ before next visit.

## 2016-02-01 NOTE — Assessment & Plan Note (Signed)
Uncontrolled, needs to commit to use of daily allergy prescription medication

## 2016-02-01 NOTE — Assessment & Plan Note (Signed)
Uncontrolled, has failed all therapy except OCP , will prescribe same short term, encourage weaning off within a 6 to 12 month period Pt aware of fact that some breast cancers are estrogen dependent hence long term estrogen replacement therapy not recommended

## 2016-02-01 NOTE — Assessment & Plan Note (Signed)
Controlled, no change in medication DASH diet and commitment to daily physical activity for a minimum of 30 minutes discussed and encouraged, as a part of hypertension management. The importance of attaining a healthy weight is also discussed.  BP/Weight 02/01/2016 12/18/2015 10/21/2015 06/23/2015 04/01/2015 03/10/2015 [id number]  Systolic BP 123456 - 123456 [id number] 123456 123456 [id number]  Diastolic BP 80 - 82 82 82 80 84  Wt. (Lbs) 363 368.6 359 362.12 364 358 364  BMI 58.62 59.52 54.6 55.85 56.14 55.21 57

## 2016-02-01 NOTE — Assessment & Plan Note (Signed)
Improved, 10 pound weight loss goal established Patient re-educated about  the importance of commitment to a  minimum of 150 minutes of exercise per week.  The importance of healthy food choices with portion control discussed. Encouraged to start a food diary, count calories and to consider  joining a support group. Sample diet sheets offered. Goals set by the patient for the next several months.   Weight /BMI 02/01/2016 12/18/2015 10/21/2015  WEIGHT 363 lb 368 lb 9.6 oz 359 lb  HEIGHT 5\' 6"  5\' 6"  5\' 8"   BMI 58.62 kg/m2 59.52 kg/m2 54.6 kg/m2

## 2016-02-23 ENCOUNTER — Ambulatory Visit (INDEPENDENT_AMBULATORY_CARE_PROVIDER_SITE_OTHER): Payer: Managed Care, Other (non HMO) | Admitting: Gastroenterology

## 2016-02-23 ENCOUNTER — Encounter: Payer: Self-pay | Admitting: Gastroenterology

## 2016-02-23 VITALS — BP 118/80 | HR 80 | Ht 66.5 in | Wt 362.5 lb

## 2016-02-23 DIAGNOSIS — Z1211 Encounter for screening for malignant neoplasm of colon: Secondary | ICD-10-CM

## 2016-02-23 DIAGNOSIS — Z6841 Body Mass Index (BMI) 40.0 and over, adult: Secondary | ICD-10-CM | POA: Diagnosis not present

## 2016-02-23 DIAGNOSIS — K219 Gastro-esophageal reflux disease without esophagitis: Secondary | ICD-10-CM

## 2016-02-23 MED ORDER — NA SULFATE-K SULFATE-MG SULF 17.5-3.13-1.6 GM/177ML PO SOLN: 1.0000 | Freq: Once | ORAL | 0 refills | Status: AC

## 2016-02-23 NOTE — Progress Notes (Signed)
[name]    [id number]    01/20/1966  Primary Care Physician:Margaret Moshe Cipro, MD  Referring Physician: Fayrene Helper, MD [address] [place], [place] [address]  Chief complaint:  Screening for colorectal cancer, morbid obesity HPI: 50 year old female with morbid obesity, hypertension, diabetes here to discuss screening colonoscopy for colorectal cancer. Patient has no specific GI complaints and denies any dysphagia, change in bowel habits, nausea, vomiting, abdominal pain, melena or bright red blood per rectum. No family history of colon cancer or polyps. Intermittent heartburn currently controlled with PPI daily She quit smoking 3 years ago. She is currently taking losartan for high blood pressure and metformin for diabetes. She reports gaining excessive weight after she quit smoking 3 years ago and during her period menopausal. She has been trying to lose weight instead continues to gain weight. According to patient and her insurance denied coverage for any bariatric surgery and informed that she wasn't eligible. Patient is interested in pursuing bariatric surgery or any other options for possible weight loss   Outpatient Encounter Prescriptions as of 02/23/2016  Medication Sig  . acetaminophen (TYLENOL) 325 MG tablet Take 650 mg by mouth as needed.  Marland Kitchen albuterol (PROVENTIL HFA;VENTOLIN HFA) 108 (90 BASE) MCG/ACT inhaler Inhale 2 puffs into the lungs every 6 (six) hours as needed for wheezing or shortness of breath.  . ALPRAZolam (XANAX) 0.25 MG tablet One tablet at bedtime as needed for insomnia, maximum of 2 tablets per week on the nights you  Do not go to work the following day  . Calcium Carbonate-Vitamin D (CALCIUM + D PO) Take 1 tablet by mouth daily.   Marland Kitchen FLUoxetine (PROZAC) 10 MG tablet Take 1 tablet (10 mg total) by mouth daily.  . fluticasone (FLONASE) 50 MCG/ACT nasal spray USE TWO SPRAY(S) IN EACH NOSTRIL ONCE DAILY  . furosemide (LASIX) 40 MG  tablet Take 1 tablet (40 mg total) by mouth daily. (Patient taking differently: Take 40 mg by mouth as needed. )  . glucose blood (ACCU-CHEK SMARTVIEW) test strip Use as instructed once daily testing E11.9  . glucose blood test strip Once daily testing  . loratadine (CLARITIN) 10 MG tablet Take 1 tablet (10 mg total) by mouth daily. (Patient taking differently: Take 10 mg by mouth as needed. )  . losartan (COZAAR) 50 MG tablet TAKE 1 TABLET EVERY DAY  . metFORMIN (GLUCOPHAGE) 500 MG tablet TAKE ONE TABLET BY MOUTH TWICE DAILY WITH MEALS.  Marland Kitchen Nutritional Supplements (EQ ESTROBLEND MENOPAUSE PO) Take 1 tablet by mouth daily.  Marland Kitchen omeprazole (PRILOSEC) 40 MG capsule Take 1 capsule (40 mg total) by mouth daily. (Patient taking differently: Take 40 mg by mouth as needed. )  . ONE TOUCH LANCETS MISC by Does not apply route.    . phentermine (ADIPEX-P) 37.5 MG tablet Take 1 tablet (37.5 mg total) by mouth daily before breakfast.  . Na Sulfate-K Sulfate-Mg Sulf (SUPREP BOWEL PREP KIT) 17.5-3.13-1.6 GM/180ML SOLN Take 1 kit by mouth once.  . [DISCONTINUED] cyclobenzaprine (FLEXERIL) 5 MG tablet One tablet at bedtime as needed, for muscle spasm  . [DISCONTINUED] Na Sulfate-K Sulfate-Mg Sulf (SUPREP BOWEL PREP KIT) 17.5-3.13-1.6 GM/180ML SOLN suprep as directed.  No substitutions  . [DISCONTINUED] norethindrone-ethinyl estradiol (JINTELI) 1-5 MG-MCG TABS tablet Take 1 tablet by mouth daily.  . [DISCONTINUED] olopatadine (PATANOL) 0.1 % ophthalmic solution Place 1 drop into both eyes 2 (two) times daily. (Patient not taking: Reported on  12/18/2015)  . [DISCONTINUED] potassium chloride SA (K-DUR,KLOR-CON) 20 MEQ tablet Take 1 tablet (20 mEq total) by mouth 2 (two) times daily.  . [DISCONTINUED] TURMERIC PO Take by mouth daily.   No facility-administered encounter medications on file as of 02/23/2016.     Allergies as of 02/23/2016 - Review Complete 02/23/2016  Allergen Reaction Noted  . Wellbutrin [bupropion]  Shortness Of Breath 02/03/2015  . Ibuprofen Hives 09/22/2008  . Phentermine Other (See Comments) 11/02/2014  . Statins Other (See Comments) 07/22/2013    Past Medical History:  Diagnosis Date  . Allergy   . Arthritis    knees  . Chronic headaches   . Depression   . Diabetes ([place])   . GERD (gastroesophageal reflux disease)   . Herpes zoster complicated 07/9377   affecting right eye with blured vision  . Hypertension   . Nicotine addiction   . Obesity   . Sinusitis     Past Surgical History:  Procedure Laterality Date  . CHOLECYSTECTOMY  1995  . [place] CYST  2004  . TUBAL LIGATION  1994    Family History  Problem Relation Age of Onset  . Diabetes Mother   . Hypertension Mother   . Stroke Mother   . Rheum arthritis Mother   . Diabetes Father   . Hypertension Father   . Rheum arthritis Father   . Colon cancer Maternal Grandfather 11  . Hypertension Sister   . Hypertension Brother   . Heart disease Sister   . Throat cancer Maternal Uncle     Social History   Social History  . Marital status: Divorced    Spouse name: N/A  . Number of children: 2  . Years of education: N/A   Occupational History  . Driver    Social History Main Topics  . Smoking status: Former Smoker    Packs/day: 1.50    Years: 30.00    Types: Cigarettes    Quit date: 03/05/2014  . Smokeless tobacco: Never Used  . Alcohol use No  . Drug use: No  . Sexual activity: Yes   Other Topics Concern  . Not on file   Social History Narrative  . No narrative on file      Review of systems: Review of Systems  Constitutional: Negative for fever and chills.  HENT: Negative.   Eyes: Negative for blurred vision.  Respiratory: Negative for cough, shortness of breath and wheezing.   Cardiovascular: Negative for chest pain and palpitations.  Gastrointestinal: as per HPI Genitourinary: Negative for dysuria, urgency, frequency and hematuria.  Musculoskeletal:  Positive for myalgias, back pain and joint pain.  Skin: Negative for itching and rash.  Neurological: Negative for dizziness, tremors, focal weakness, seizures and loss of consciousness.  Endo/Heme/Allergies: Positive for environmental allergies.  Psychiatric/Behavioral:Positive for depression and anxiety, negative for suicidal ideas and hallucinations.  All other systems reviewed and are negative.   Physical Exam: Vitals:   02/23/16 1013  BP: 118/80  Pulse: 80   Gen:      No acute distress, morbidly obese HEENT:  EOMI, sclera anicteric Neck:     No masses; no thyromegaly Lungs:    Clear to auscultation bilaterally; normal respiratory effort CV:         Regular rate and rhythm; no murmurs Abd:      + bowel sounds; soft, non-tender; no palpable masses, no distension, small midline ventral hernia Ext:    No edema; adequate peripheral perfusion Skin:  Warm and dry; no rash Neuro: alert and oriented x 3 Psych: normal mood and affect  Data Reviewed:  Reviewed chart in epic   Assessment and Plan/Recommendations:  50 year old female with morbid obesity, hypertension, diabetes and GERD here to discuss screening colonoscopy for colorectal cancer screening. Patient's BMI is greater than 50 and will need to schedule colonoscopy at Hospital endoscopy unit The risks and benefits as well as alternatives of endoscopic procedure(s) have been discussed and reviewed. All questions answered. The patient agrees to proceed. Morbid obesity: Discussed in detail about exercise and diet to lose weight Also advised patient to contact bariatric surgery program ([facility] [place] [facility] ) to discuss the available options GERD: Continue PPI and antireflux regimen Return as needed  Greater than 50% of the time used for counseling as well as treatment plan and follow-up. She had multiple questions which were answered to her satisfaction    K. Denzil Magnuson , MD [phone] Mon-Fri 8a-5p [phone] after  5p, weekends, holidays  CC: Fayrene Helper, MD

## 2016-02-23 NOTE — Patient Instructions (Signed)
You have been scheduled for a colonoscopy at [facility] on 04/08/2016 at 11:30am, separate instructions have been given  Hold Adipex 10 days before your colonoscopy  [facility] at [phone] for information on Bariatric Surgery   Follow up as needed

## 2016-04-01 ENCOUNTER — Other Ambulatory Visit: Payer: Self-pay | Admitting: Family Medicine

## 2016-04-05 ENCOUNTER — Encounter (HOSPITAL_COMMUNITY): Payer: Self-pay | Admitting: *Deleted

## 2016-04-08 ENCOUNTER — Ambulatory Visit (HOSPITAL_COMMUNITY)
Admission: RE | Admit: 2016-04-08 | Discharge: 2016-04-08 | Disposition: A | Discharge status: Home / Self Care | Payer: Managed Care, Other (non HMO) | Source: Ambulatory Visit | Attending: Gastroenterology | Admitting: Gastroenterology

## 2016-04-08 ENCOUNTER — Ambulatory Visit (HOSPITAL_COMMUNITY): Payer: Managed Care, Other (non HMO) | Admitting: Anesthesiology

## 2016-04-08 ENCOUNTER — Encounter (HOSPITAL_COMMUNITY): Payer: Self-pay | Admitting: *Deleted

## 2016-04-08 ENCOUNTER — Encounter (HOSPITAL_COMMUNITY)
Admission: RE | Disposition: A | Discharge status: Home / Self Care | Payer: Self-pay | Source: Ambulatory Visit | Attending: Gastroenterology

## 2016-04-08 DIAGNOSIS — Z1211 Encounter for screening for malignant neoplasm of colon: Secondary | ICD-10-CM | POA: Diagnosis not present

## 2016-04-08 DIAGNOSIS — J449 Chronic obstructive pulmonary disease, unspecified: Secondary | ICD-10-CM | POA: Insufficient documentation

## 2016-04-08 DIAGNOSIS — K635 Polyp of colon: Secondary | ICD-10-CM | POA: Insufficient documentation

## 2016-04-08 DIAGNOSIS — K621 Rectal polyp: Secondary | ICD-10-CM | POA: Diagnosis not present

## 2016-04-08 DIAGNOSIS — Z9049 Acquired absence of other specified parts of digestive tract: Secondary | ICD-10-CM | POA: Insufficient documentation

## 2016-04-08 DIAGNOSIS — K648 Other hemorrhoids: Secondary | ICD-10-CM | POA: Insufficient documentation

## 2016-04-08 DIAGNOSIS — K219 Gastro-esophageal reflux disease without esophagitis: Secondary | ICD-10-CM | POA: Insufficient documentation

## 2016-04-08 DIAGNOSIS — Z7984 Long term (current) use of oral hypoglycemic drugs: Secondary | ICD-10-CM | POA: Diagnosis not present

## 2016-04-08 DIAGNOSIS — D122 Benign neoplasm of ascending colon: Secondary | ICD-10-CM | POA: Diagnosis not present

## 2016-04-08 DIAGNOSIS — E119 Type 2 diabetes mellitus without complications: Secondary | ICD-10-CM | POA: Diagnosis not present

## 2016-04-08 DIAGNOSIS — Z7951 Long term (current) use of inhaled steroids: Secondary | ICD-10-CM | POA: Insufficient documentation

## 2016-04-08 DIAGNOSIS — Z6841 Body Mass Index (BMI) 40.0 and over, adult: Secondary | ICD-10-CM | POA: Insufficient documentation

## 2016-04-08 DIAGNOSIS — F419 Anxiety disorder, unspecified: Secondary | ICD-10-CM | POA: Insufficient documentation

## 2016-04-08 DIAGNOSIS — I10 Essential (primary) hypertension: Secondary | ICD-10-CM | POA: Diagnosis not present

## 2016-04-08 DIAGNOSIS — Z860101 Personal history of adenomatous and serrated colon polyps: Secondary | ICD-10-CM

## 2016-04-08 DIAGNOSIS — F329 Major depressive disorder, single episode, unspecified: Secondary | ICD-10-CM | POA: Diagnosis not present

## 2016-04-08 DIAGNOSIS — D123 Benign neoplasm of transverse colon: Secondary | ICD-10-CM | POA: Diagnosis not present

## 2016-04-08 DIAGNOSIS — E669 Obesity, unspecified: Secondary | ICD-10-CM | POA: Insufficient documentation

## 2016-04-08 DIAGNOSIS — Z79899 Other long term (current) drug therapy: Secondary | ICD-10-CM | POA: Insufficient documentation

## 2016-04-08 DIAGNOSIS — K5730 Diverticulosis of large intestine without perforation or abscess without bleeding: Secondary | ICD-10-CM | POA: Diagnosis not present

## 2016-04-08 DIAGNOSIS — Z87891 Personal history of nicotine dependence: Secondary | ICD-10-CM | POA: Insufficient documentation

## 2016-04-08 HISTORY — PX: COLONOSCOPY WITH PROPOFOL: SHX5780

## 2016-04-08 LAB — GLUCOSE, CAPILLARY: Glucose-Capillary: 118 mg/dL — ABNORMAL HIGH (ref 65–99)

## 2016-04-08 SURGERY — COLONOSCOPY WITH PROPOFOL
Anesthesia: Monitor Anesthesia Care

## 2016-04-08 MED ORDER — LACTATED RINGERS IV SOLN
INTRAVENOUS | Status: DC
Start: 2016-04-08 — End: 2016-04-08
  Administered 2016-04-08: 1000 mL via INTRAVENOUS

## 2016-04-08 MED ORDER — PROPOFOL 10 MG/ML IV BOLUS
INTRAVENOUS | Status: AC
  Filled 2016-04-08: qty 20

## 2016-04-08 MED ORDER — PROPOFOL 10 MG/ML IV BOLUS
INTRAVENOUS | Status: DC | PRN
  Administered 2016-04-08: 50 mg via INTRAVENOUS
  Administered 2016-04-08: 20 mg via INTRAVENOUS
  Administered 2016-04-08: 40 mg via INTRAVENOUS

## 2016-04-08 MED ORDER — SODIUM CHLORIDE 0.9 % IV SOLN: INTRAVENOUS | Status: DC

## 2016-04-08 MED ORDER — PROPOFOL 10 MG/ML IV BOLUS
INTRAVENOUS | Status: AC
  Filled 2016-04-08: qty 40

## 2016-04-08 MED ORDER — PROPOFOL 500 MG/50ML IV EMUL
INTRAVENOUS | Status: DC | PRN
  Administered 2016-04-08: 100 ug/kg/min via INTRAVENOUS

## 2016-04-08 SURGICAL SUPPLY — 21 items

## 2016-04-08 NOTE — H&P (Signed)
[facility] History and Physical   Primary Care Physician:  Tula Nakayama, MD   Reason for Procedure:   Colorectal cancer screening Plan:    Colonoscopy with possible intervention     HPI: [name] is a 50 y.o. female with morbid obesity is here for colonoscopy for colorectal cancer screening. Denies any nausea, vomiting, abdominal pain, melena or bright red blood per rectum    Past Medical History:  Diagnosis Date  . Allergy   . Arthritis    knees  . Chronic headaches   . Depression   . Diabetes ([place])   . GERD (gastroesophageal reflux disease)   . Herpes zoster complicated [id number]   affecting right eye with blured vision  . Hypertension   . Nicotine addiction   . Obesity   . Sinusitis     Past Surgical History:  Procedure Laterality Date  . CHOLECYSTECTOMY  1995   laparoscopic  . [place] CYST  2004  . TONSILLECTOMY     child  . TUBAL LIGATION  1994    Prior to Admission medications   Medication Sig Start Date End Date Taking? Authorizing Provider  acetaminophen (TYLENOL) 325 MG tablet Take 650 mg by mouth as needed.   Yes Historical Provider, MD  albuterol (PROVENTIL HFA;VENTOLIN HFA) 108 (90 BASE) MCG/ACT inhaler Inhale 2 puffs into the lungs every 6 (six) hours as needed for wheezing or shortness of breath. 10/27/14  Yes Fayrene Helper, MD  Calcium Carbonate-Vitamin D (CALCIUM + D PO) Take 1 tablet by mouth daily.    Yes Historical Provider, MD  FLUoxetine (PROZAC) 10 MG tablet Take 1 tablet (10 mg total) by mouth daily. 12/16/15  Yes Fayrene Helper, MD  fluticasone [facility]) 50 MCG/ACT nasal spray USE TWO SPRAY(S) IN EACH NOSTRIL ONCE DAILY 02/01/16  Yes Fayrene Helper, MD  furosemide (LASIX) 40 MG tablet Take 1 tablet (40 mg total) by mouth daily. Patient taking differently: Take 40 mg by mouth as needed.  02/03/15  Yes Fayrene Helper, MD  glucose blood (ACCU-CHEK SMARTVIEW) test strip Use as  instructed once daily testing E11.9 02/03/15  Yes Fayrene Helper, MD  glucose blood test strip Once daily testing 04/01/11  Yes Fayrene Helper, MD  phentermine (ADIPEX-P) 37.5 MG tablet Take 1 tablet (37.5 mg total) by mouth daily before breakfast. Patient taking differently: Take 37.5 mg by mouth daily before breakfast. Not taking in over month 02/01/16  Yes Fayrene Helper, MD  ALPRAZolam Duanne Moron) 0.25 MG tablet One tablet at bedtime as needed for insomnia, maximum of 2 tablets per week on the nights you  Do not go to work the following day 04/01/15   Fayrene Helper, MD  loratadine (CLARITIN) 10 MG tablet Take 1 tablet (10 mg total) by mouth daily. Patient taking differently: Take 10 mg by mouth as needed.  10/21/15   Fayrene Helper, MD  losartan (COZAAR) 50 MG tablet TAKE 1 TABLET EVERY DAY 04/04/16   Fayrene Helper, MD  metFORMIN (GLUCOPHAGE) 500 MG tablet TAKE ONE TABLET BY MOUTH TWICE DAILY WITH MEALS. 11/16/15   Fayrene Helper, MD  Nutritional Supplements (EQ ESTROBLEND MENOPAUSE PO) Take 1 tablet by mouth daily.    Historical Provider, MD  omeprazole (PRILOSEC) 40 MG capsule Take 1 capsule (40 mg total) by mouth daily. Patient taking differently: Take 40 mg by mouth as needed.  11/02/14   Fayrene Helper, MD  ONE TOUCH LANCETS MISC by  Does not apply route.      Historical Provider, MD    Current Facility-Administered Medications  Medication Dose Route Frequency Provider Last Rate Last Dose  . 0.9 %  sodium chloride infusion   Intravenous Continuous Mauri Pole, MD      . lactated ringers infusion   Intravenous Continuous Mauri Pole, MD 20 mL/hr at 04/08/16 1044 1,000 mL at 04/08/16 1044    Allergies as of 02/23/2016 - Review Complete 02/23/2016  Allergen Reaction Noted  . Wellbutrin [bupropion] Shortness Of Breath 02/03/2015  . Ibuprofen Hives 09/22/2008  . Phentermine Other (See Comments) 11/02/2014  . Statins Other (See Comments) 07/22/2013     Family History  Problem Relation Age of Onset  . Diabetes Mother   . Hypertension Mother   . Stroke Mother   . Rheum arthritis Mother   . Diabetes Father   . Hypertension Father   . Rheum arthritis Father   . Colon cancer Maternal Grandfather 51  . Hypertension Sister   . Hypertension Brother   . Heart disease Sister   . Throat cancer Maternal Uncle     Social History   Social History  . Marital status: Divorced    Spouse name: N/A  . Number of children: 2  . Years of education: N/A   Occupational History  . Driver    Social History Main Topics  . Smoking status: Former Smoker    Packs/day: 1.50    Years: 30.00    Types: Cigarettes    Quit date: 03/05/2014  . Smokeless tobacco: Never Used  . Alcohol use No  . Drug use: No  . Sexual activity: Yes   Other Topics Concern  . Not on file   Social History Narrative  . No narrative on file    Review of Systems:  All other review of systems negative except as mentioned in the HPI.  Physical Exam: Vital signs in last 24 hours: Temp:  [98.2 F (36.8 C)] 98.2 F (36.8 C) (09/15 1024) Pulse Rate:  [80] 80 (09/15 1024) Resp:  [15] 15 (09/15 1024) BP: (139)/(90) 139/90 (09/15 1024) SpO2:  [95 %] 95 % (09/15 1024) Weight:  [362 lb (164.2 kg)] 362 lb (164.2 kg) (09/15 1024)   General:   Alert,  Well-developed, well-nourished, pleasant and cooperative in NAD Lungs:  Clear throughout to auscultation.   Heart:  Regular rate and rhythm; no murmurs, clicks, rubs,  or gallops. Abdomen:  Soft, nontender and nondistended. Normal bowel sounds.   Neuro/Psych:  Alert and cooperative. Normal mood and affect. A and O x 3   @K .Denzil Magnuson, MD [phone] Mon-Fri 8a-5p [phone] after 5p, weekends, holidays 04/08/2016 11:40 AM@

## 2016-04-08 NOTE — Op Note (Signed)
[facility] Patient Name: [name] Procedure Date: 04/08/2016 MRN: [id number] Attending MD: Mauri Pole , MD Date of Birth: 1966-07-13 CSN: [id number] Age: 50 Admit Type: Outpatient Procedure:                Colonoscopy Indications:              Screening for colorectal malignant neoplasm, This                            is the patient's first colonoscopy Providers:                Mauri Pole, MD, Cherylynn Ridges, Technician,                            Delena Bali, CRNA, Hilma Favors, RN Referring MD:              Medicines:                Monitored Anesthesia Care Complications:            No immediate complications. Estimated Blood Loss:     Estimated blood loss: none. Estimated blood loss                            was minimal. Procedure:                Pre-Anesthesia Assessment:                           - Prior to the procedure, a History and Physical                            was performed, and patient medications and                            allergies were reviewed. The patient's tolerance of                            previous anesthesia was also reviewed. The risks                            and benefits of the procedure and the sedation                            options and risks were discussed with the patient.                            All questions were answered, and informed consent                            was obtained. Prior Anticoagulants: The patient has                            taken no previous anticoagulant or antiplatelet  agents. ASA Grade Assessment: III - A patient with                            severe systemic disease. After reviewing the risks                            and benefits, the patient was deemed in                            satisfactory condition to undergo the procedure.                           After obtaining informed consent, the colonoscope     was passed under direct vision. Throughout the                            procedure, the patient's blood pressure, pulse, and                            oxygen saturations were monitored continuously. The                            [id number]) scope was introduced through                            the anus and advanced to the the cecum, identified                            by appendiceal orifice and ileocecal valve. The                            colonoscopy was performed without difficulty. The                            patient tolerated the procedure well. The quality                            of the bowel preparation was adequate. The                            ileocecal valve, appendiceal orifice, and rectum                            were photographed. Scope In: [id number] PM Scope Out: 12:43:55 PM Scope Withdrawal Time: 0 hours 23 minutes 5 seconds  Total Procedure Duration: 0 hours 27 minutes 46 seconds  Findings:      The perianal and digital rectal examinations were normal.      Two sessile polyps were found in the transverse colon and ascending       colon. The polyps were 5 to 6 mm in size. These polyps were removed with       a cold snare. Resection and retrieval were complete.      Seven sessile polyps were found in the rectum and transverse colon. The  polyps were 3 to 4 mm in size. These polyps were removed with a cold       biopsy forceps. Resection and retrieval were complete.      Multiple small and large-mouthed diverticula were found in the sigmoid       colon and descending colon.      Non-bleeding internal hemorrhoids were found during retroflexion. The       hemorrhoids were small. Impression:               - Two 5 to 6 mm polyps in the transverse colon and                            in the ascending colon, removed with a cold snare.                            Resected and retrieved.                           - Seven 3 to 4 mm polyps in the rectum  and in the                            transverse colon, removed with a cold biopsy                            forceps. Resected and retrieved.                           - Diverticulosis in the sigmoid colon and in the                            descending colon.                           - Non-bleeding internal hemorrhoids. Moderate Sedation:      N/A- Per Anesthesia Care Recommendation:           - Patient has a contact number available for                            emergencies. The signs and symptoms of potential                            delayed complications were discussed with the                            patient. Return to normal activities tomorrow.                            Written discharge instructions were provided to the                            patient.                           - Resume previous diet.                           -  Continue present medications.                           - Await pathology results.                           - Repeat colonoscopy in 3 - 5 years for                            surveillance based on pathology results.                           - Return to GI clinic PRN. Procedure Code(s):        --- Professional ---                           [phone], Colonoscopy, flexible; with removal of                            tumor(s), polyp(s), or other lesion(s) by snare                            technique                           45380, 24, Colonoscopy, flexible; with biopsy,                            single or multiple Diagnosis Code(s):        --- Professional ---                           Z12.11, Encounter for screening for malignant                            neoplasm of colon                           D12.2, Benign neoplasm of ascending colon                           K62.1, Rectal polyp                           D12.3, Benign neoplasm of transverse colon (hepatic                            flexure or splenic flexure)                            K64.8, Other hemorrhoids                           K57.30, Diverticulosis of large intestine without                            perforation or abscess without bleeding CPT copyright 2016 American Medical Association. All  rights reserved. The codes documented in this report are preliminary and upon coder review may  be revised to meet current compliance requirements. Mauri Pole, MD 04/08/2016 12:57:24 PM This report has been signed electronically. Number of Addenda: 0

## 2016-04-08 NOTE — Transfer of Care (Signed)
Immediate Anesthesia Transfer of Care Note  Patient: [name]  Procedure(s) Performed: Procedure(s): COLONOSCOPY WITH PROPOFOL (N/A)  Patient Location: PACU  Anesthesia Type:MAC  Level of Consciousness:  sedated, patient cooperative and responds to stimulation  Airway & Oxygen Therapy:Patient Spontanous Breathing and Patient connected to face mask oxgen  Post-op Assessment:  Report given to PACU RN and Post -op Vital signs reviewed and stable  Post vital signs:  Reviewed and stable  Last Vitals:  Vitals:   04/08/16 1024  BP: 139/90  Pulse: 80  Resp: 15  Temp: [id number] C    Complications: No apparent anesthesia complications

## 2016-04-08 NOTE — Anesthesia Postprocedure Evaluation (Signed)
Anesthesia Post Note  Patient: [name]  Procedure(s) Performed: Procedure(s) (LRB): COLONOSCOPY WITH PROPOFOL (N/A)  Patient location during evaluation: PACU Anesthesia Type: MAC Level of consciousness: awake and alert Pain management: pain level controlled Vital Signs Assessment: post-procedure vital signs reviewed and stable Respiratory status: spontaneous breathing, nonlabored ventilation, respiratory function stable and patient connected to nasal cannula oxygen Cardiovascular status: stable and blood pressure returned to baseline Anesthetic complications: no    Last Vitals:  Vitals:   04/08/16 1024 04/08/16 1253  BP: 139/90 132/76  Pulse: 80 87  Resp: 15 15  Temp: 36.8 C 36.6 C    Last Pain:  Vitals:   04/08/16 1253  TempSrc: Oral                 Yicel Shannon JENNETTE

## 2016-04-08 NOTE — Anesthesia Preprocedure Evaluation (Signed)
Anesthesia Evaluation  Patient identified by MRN, date of birth, ID band Patient awake    Reviewed: Allergy & Precautions, NPO status , Patient's Chart, lab work & pertinent test results  History of Anesthesia Complications Negative for: history of anesthetic complications  Airway Mallampati: II  TM Distance: >3 FB Neck ROM: Full    Dental no notable dental hx. (+) Dental Advisory Given   Pulmonary asthma , COPD, former smoker,    Pulmonary exam normal breath sounds clear to auscultation       Cardiovascular hypertension, negative cardio ROS Normal cardiovascular exam Rhythm:Regular Rate:Normal     Neuro/Psych  Headaches, PSYCHIATRIC DISORDERS Anxiety Depression    GI/Hepatic Neg liver ROS, GERD  Controlled,  Endo/Other  diabetesMorbid obesity  Renal/GU negative Renal ROS  negative genitourinary   Musculoskeletal  (+) Arthritis ,   Abdominal   Peds negative pediatric ROS (+)  Hematology negative hematology ROS (+)   Anesthesia Other Findings   Reproductive/Obstetrics negative OB ROS                             Anesthesia Physical Anesthesia Plan  ASA: III  Anesthesia Plan: MAC   Post-op Pain Management:    Induction: Intravenous  Airway Management Planned: Nasal Cannula  Additional Equipment:   Intra-op Plan:   Post-operative Plan:   Informed Consent: I have reviewed the patients History and Physical, chart, labs and discussed the procedure including the risks, benefits and alternatives for the proposed anesthesia with the patient or authorized representative who has indicated his/her understanding and acceptance.   Dental advisory given  Plan Discussed with: CRNA  Anesthesia Plan Comments:         Anesthesia Quick Evaluation

## 2016-04-08 NOTE — Discharge Instructions (Signed)
YOU HAD AN ENDOSCOPIC PROCEDURE TODAY: Refer to the procedure report and other information in the discharge instructions given to you for any specific questions about what was found during the examination. If this information does not answer your questions, please call [place] office at [phone] to clarify.  ° °YOU SHOULD EXPECT: Some feelings of bloating in the abdomen. Passage of more gas than usual. Walking can help get rid of the air that was put into your GI tract during the procedure and reduce the bloating. If you had a lower endoscopy (such as a colonoscopy or flexible sigmoidoscopy) you may notice spotting of blood in your stool or on the toilet paper. Some abdominal soreness may be present for a day or two, also. ° °DIET: Your first meal following the procedure should be a light meal and then it is ok to progress to your normal diet. A half-sandwich or bowl of soup is an example of a good first meal. Heavy or fried foods are harder to digest and may make you feel nauseous or bloated. Drink plenty of fluids but you should avoid alcoholic beverages for 24 hours. If you had a esophageal dilation, please see attached instructions for diet.   ° °ACTIVITY: Your care partner should take you home directly after the procedure. You should plan to take it easy, moving slowly for the rest of the day. You can resume normal activity the day after the procedure however YOU SHOULD NOT DRIVE, use power tools, machinery or perform tasks that involve climbing or major physical exertion for 24 hours (because of the sedation medicines used during the test).  ° °SYMPTOMS TO REPORT IMMEDIATELY: °A gastroenterologist can be reached at any hour. Please call [phone]  for any of the following symptoms:  °Following lower endoscopy (colonoscopy, flexible sigmoidoscopy) °Excessive amounts of blood in the stool  °Significant tenderness, worsening of abdominal pains  °Swelling of the abdomen that is new, acute  °Fever of 100° or  higher  °Following upper endoscopy (EGD, EUS, ERCP, esophageal dilation) °Vomiting of blood or coffee ground material  °New, significant abdominal pain  °New, significant chest pain or pain under the shoulder blades  °Painful or persistently difficult swallowing  °New shortness of breath  °Black, tarry-looking or red, bloody stools ° °FOLLOW UP:  °If any biopsies were taken you will be contacted by phone or by letter within the next 1-3 weeks. Call [phone]  if you have not heard about the biopsies in 3 weeks.  °Please also call with any specific questions about appointments or follow up tests. ° °

## 2016-04-11 ENCOUNTER — Encounter (HOSPITAL_COMMUNITY): Payer: Self-pay | Admitting: Gastroenterology

## 2016-04-23 ENCOUNTER — Other Ambulatory Visit: Payer: Self-pay | Admitting: Family Medicine

## 2016-04-23 DIAGNOSIS — F329 Major depressive disorder, single episode, unspecified: Secondary | ICD-10-CM

## 2016-04-23 DIAGNOSIS — F32A Depression, unspecified: Secondary | ICD-10-CM

## 2016-05-03 ENCOUNTER — Encounter: Payer: Self-pay | Admitting: Gastroenterology

## 2016-05-12 ENCOUNTER — Telehealth: Payer: Self-pay

## 2016-05-12 NOTE — Telephone Encounter (Signed)
Patient aware.  Refuses to schedule appointment

## 2016-05-12 NOTE — Telephone Encounter (Signed)
Urgent care eval, unsafe tpo prescribe antibiotic without provider eval,

## 2016-05-26 ENCOUNTER — Telehealth: Payer: Self-pay | Admitting: Family Medicine

## 2016-05-26 NOTE — Telephone Encounter (Signed)
[name] is asking if her diet pill can be switched over to a capsule form and called to her pharmacy, please advise?

## 2016-05-27 MED ORDER — PHENTERMINE HCL 37.5 MG PO CAPS: 37.5000 mg | ORAL_CAPSULE | ORAL | 0 refills | Status: DC

## 2016-05-27 NOTE — Telephone Encounter (Signed)
Called and cancelled rx for tablets at pharmacy.  Patient is aware that the capsule may cost more.  Rx awaiting signature.

## 2016-06-04 ENCOUNTER — Other Ambulatory Visit: Payer: Self-pay | Admitting: Family Medicine

## 2016-06-06 ENCOUNTER — Other Ambulatory Visit: Payer: Self-pay | Admitting: Family Medicine

## 2016-06-25 ENCOUNTER — Other Ambulatory Visit: Payer: Self-pay | Admitting: Family Medicine

## 2016-06-25 LAB — LIPID PANEL
CHOL/HDL RATIO: 2.4 ratio (ref <5.0)
Cholesterol: 178 mg/dL (ref <200)
HDL: 73 mg/dL (ref >50)
LDL CALC: 89 mg/dL (ref <100)
TRIGLYCERIDES: 81 mg/dL (ref <150)
VLDL: 16 mg/dL (ref <30)

## 2016-06-25 LAB — TSH: TSH: 1.67 mIU/L

## 2016-06-26 LAB — HEMOGLOBIN A1C
HEMOGLOBIN A1C: 7 % — AB (ref <5.7)
Mean Plasma Glucose: 154 mg/dL

## 2016-06-28 ENCOUNTER — Encounter: Payer: Self-pay | Admitting: Family Medicine

## 2016-06-28 ENCOUNTER — Ambulatory Visit (INDEPENDENT_AMBULATORY_CARE_PROVIDER_SITE_OTHER): Payer: Managed Care, Other (non HMO) | Admitting: Family Medicine

## 2016-06-28 ENCOUNTER — Other Ambulatory Visit (HOSPITAL_COMMUNITY)
Admission: RE | Admit: 2016-06-28 | Discharge: 2016-06-28 | Disposition: A | Discharge status: Home / Self Care | Payer: Managed Care, Other (non HMO) | Source: Ambulatory Visit | Attending: Family Medicine | Admitting: Family Medicine

## 2016-06-28 VITALS — BP 120/82 | HR 80 | Temp 98.6°F | Resp 20 | Ht 67.0 in | Wt 369.0 lb

## 2016-06-28 DIAGNOSIS — Z1211 Encounter for screening for malignant neoplasm of colon: Secondary | ICD-10-CM

## 2016-06-28 DIAGNOSIS — Z124 Encounter for screening for malignant neoplasm of cervix: Secondary | ICD-10-CM

## 2016-06-28 DIAGNOSIS — G4700 Insomnia, unspecified: Secondary | ICD-10-CM

## 2016-06-28 DIAGNOSIS — Z01419 Encounter for gynecological examination (general) (routine) without abnormal findings: Secondary | ICD-10-CM | POA: Diagnosis present

## 2016-06-28 DIAGNOSIS — Z0000 Encounter for general adult medical examination without abnormal findings: Secondary | ICD-10-CM | POA: Diagnosis not present

## 2016-06-28 DIAGNOSIS — E0840 Diabetes mellitus due to underlying condition with diabetic neuropathy, unspecified: Secondary | ICD-10-CM

## 2016-06-28 DIAGNOSIS — Z1151 Encounter for screening for human papillomavirus (HPV): Secondary | ICD-10-CM | POA: Diagnosis not present

## 2016-06-28 LAB — POC HEMOCCULT BLD/STL (OFFICE/1-CARD/DIAGNOSTIC): Fecal Occult Blood, POC: NEGATIVE

## 2016-06-28 LAB — BASIC METABOLIC PANEL WITH GFR

## 2016-06-28 LAB — HEMOGLOBIN A1C

## 2016-06-28 MED ORDER — TEMAZEPAM 15 MG PO CAPS: 15.0000 mg | ORAL_CAPSULE | Freq: Every evening | ORAL | 0 refills | Status: DC | PRN

## 2016-06-28 NOTE — Patient Instructions (Addendum)
F/u in 3.5 month, call if you need me before  Labs in 3.5 month  Eat regularly , vegetables , fruit and beans and water are healthy  It is important that you exercise regularly at least 30 minutes 5 times a week. If you develop chest pain, have severe difficulty breathing, or feel very tired, stop exercising immediately and seek medical attention   New medication sent for help with sleep on avg 2 to 3 times per week  Pain in left forearm is referred from pain in wrist from carpal tunnel  Weight loss goal of 12 pounds  Take tWO metformin daily  Thank you  for choosing [facility]. We consider it a privelige to serve you.  Delivering excellent health care in a caring and  compassionate way is our goal.  Partnering with you,  so that together we can achieve this goal is our strategy.

## 2016-06-28 NOTE — Progress Notes (Signed)
    [name]     MRN: [id number]      DOB: 03-09-1966  HPI: Patient is in for annual physical exam. C/o left forearm pain radiating from wrist, has carpal tunnel, denies weakness and numbness in hand, pain does awaken pt at [employer] ongoing weight gain, started phentermine last week, intends to resume exercise, depressed over weight, again surgery has been declined by her insurance. C/o poor sleep needs medication on avg twice per week, states spouse refuses to turn TV off Recent labs, if available are reviewed. Immunization is reviewed , and  updated if needed.   PE: Pleasant  female, alert and oriented x 3, in no cardio-pulmonary distress. Afebrile. HEENT No facial trauma or asymetry. Sinuses non tender.  Extra occullar muscles intact, pupils equally reactive to light. External ears normal, tympanic membranes clear. Oropharynx moist, no exudate. Neck: supple, no adenopathy,JVD or thyromegaly.No bruits.  Chest: Clear to ascultation bilaterally.No crackles or wheezes. Non tender to palpation  Breast: No asymetry,no masses or lumps. No tenderness. No nipple discharge or inversion. No axillary or supraclavicular adenopathy  Cardiovascular system; Heart sounds normal,  S1 and  S2 ,no S3.  No murmur, or thrill. Apical beat not displaced Peripheral pulses normal.  Abdomen: Soft, non tender, no organomegaly or masses. No bruits. Bowel sounds normal. No guarding, tenderness or rebound.  Rectal:  Normal sphincter tone. No rectal mass. Guaiac negative stool.  GU: External genitalia normal female genitalia , normal female distribution of hair. No lesions. Urethral meatus normal in size, no  Prolapse, no lesions visibly  Present. Bladder non tender. Vagina pink and moist , with no visible lesions , discharge present . Adequate pelvic support no  cystocele or rectocele noted Cervix pink and appears healthy, no lesions or ulcerations noted, no discharge noted from  os Uterus normal size, no adnexal masses, no cervical motion or adnexal tenderness.   Musculoskeletal exam: Full ROM of spine, hips , shoulders and knees. No deformity ,swelling or crepitus noted. No muscle wasting or atrophy.   Neurologic: Cranial nerves 2 to 12 intact. Power, tone ,sensation and reflexes normal throughout. No disturbance in gait. No tremor.  Skin: Intact, no ulceration, erythema , scaling or rash noted. Pigmentation normal throughout  Psych; Normal mood and affect. Judgement and concentration normal   Assessment & Plan:  Annual physical exam Annual exam as documented. Counseling done  re healthy lifestyle involving commitment to 150 minutes exercise per week, heart healthy diet, and attaining healthy weight.The importance of adequate sleep also discussed.  Changes in health habits are decided on by the patient with goals and time frames  set for achieving them. Immunization and cancer screening needs are specifically addressed at this visit.   Insomnia Sleep hygiene reviewed and written information offered also. Prescription sent for  medication needed.

## 2016-06-28 NOTE — Assessment & Plan Note (Signed)
Annual exam as documented. Counseling done  re healthy lifestyle involving commitment to 150 minutes exercise per week, heart healthy diet, and attaining healthy weight.The importance of adequate sleep also discussed. Changes in health habits are decided on by the patient with goals and time frames  set for achieving them. Immunization and cancer screening needs are specifically addressed at this visit. 

## 2016-06-28 NOTE — Assessment & Plan Note (Signed)
Sleep hygiene reviewed and written information offered also. Prescription sent for  medication needed.  

## 2016-06-30 LAB — CYTOLOGY - PAP
Diagnosis: NEGATIVE
HPV: NOT DETECTED

## 2016-07-01 LAB — VITAMIN D 1,25 DIHYDROXY
VITAMIN D3 1, 25 (OH): 43 pg/mL
Vitamin D 1, 25 (OH)2 Total: 43 pg/mL (ref 18–72)
Vitamin D2 1, 25 (OH)2: <8 pg/mL

## 2016-07-14 ENCOUNTER — Other Ambulatory Visit: Payer: Self-pay | Admitting: Family Medicine

## 2016-07-22 ENCOUNTER — Emergency Department (HOSPITAL_BASED_OUTPATIENT_CLINIC_OR_DEPARTMENT_OTHER)
Admission: EM | Admit: 2016-07-22 | Discharge: 2016-07-22 | Disposition: A | Discharge status: Home / Self Care | Payer: Worker's Compensation | Attending: Emergency Medicine | Admitting: Emergency Medicine

## 2016-07-22 ENCOUNTER — Encounter (HOSPITAL_BASED_OUTPATIENT_CLINIC_OR_DEPARTMENT_OTHER): Payer: Self-pay | Admitting: *Deleted

## 2016-07-22 DIAGNOSIS — Z7984 Long term (current) use of oral hypoglycemic drugs: Secondary | ICD-10-CM | POA: Insufficient documentation

## 2016-07-22 DIAGNOSIS — Z87891 Personal history of nicotine dependence: Secondary | ICD-10-CM | POA: Insufficient documentation

## 2016-07-22 DIAGNOSIS — J449 Chronic obstructive pulmonary disease, unspecified: Secondary | ICD-10-CM | POA: Diagnosis not present

## 2016-07-22 DIAGNOSIS — Y999 Unspecified external cause status: Secondary | ICD-10-CM | POA: Insufficient documentation

## 2016-07-22 DIAGNOSIS — S0990XA Unspecified injury of head, initial encounter: Secondary | ICD-10-CM | POA: Diagnosis present

## 2016-07-22 DIAGNOSIS — E119 Type 2 diabetes mellitus without complications: Secondary | ICD-10-CM | POA: Diagnosis not present

## 2016-07-22 DIAGNOSIS — R002 Palpitations: Secondary | ICD-10-CM | POA: Diagnosis not present

## 2016-07-22 DIAGNOSIS — J4520 Mild intermittent asthma, uncomplicated: Secondary | ICD-10-CM | POA: Insufficient documentation

## 2016-07-22 DIAGNOSIS — I10 Essential (primary) hypertension: Secondary | ICD-10-CM | POA: Diagnosis not present

## 2016-07-22 DIAGNOSIS — R51 Headache: Secondary | ICD-10-CM | POA: Diagnosis not present

## 2016-07-22 DIAGNOSIS — Y92410 Unspecified street and highway as the place of occurrence of the external cause: Secondary | ICD-10-CM | POA: Insufficient documentation

## 2016-07-22 DIAGNOSIS — Y939 Activity, unspecified: Secondary | ICD-10-CM | POA: Diagnosis not present

## 2016-07-22 DIAGNOSIS — Z79899 Other long term (current) drug therapy: Secondary | ICD-10-CM | POA: Insufficient documentation

## 2016-07-22 DIAGNOSIS — R519 Headache, unspecified: Secondary | ICD-10-CM
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
## 2016-07-30 ENCOUNTER — Other Ambulatory Visit: Payer: Self-pay | Admitting: Family Medicine

## 2016-08-06 ENCOUNTER — Other Ambulatory Visit: Payer: Self-pay | Admitting: Family Medicine

## 2016-08-09 ENCOUNTER — Other Ambulatory Visit: Payer: Self-pay | Admitting: Family Medicine

## 2016-08-29 ENCOUNTER — Other Ambulatory Visit: Payer: Self-pay | Admitting: Family Medicine

## 2016-08-29 DIAGNOSIS — F32A Depression, unspecified: Secondary | ICD-10-CM

## 2016-08-29 DIAGNOSIS — F329 Major depressive disorder, single episode, unspecified: Secondary | ICD-10-CM

## 2016-09-01 ENCOUNTER — Telehealth: Payer: Self-pay | Admitting: Family Medicine

## 2016-09-02 ENCOUNTER — Other Ambulatory Visit: Payer: Self-pay | Admitting: Family Medicine

## 2016-09-02 NOTE — Telephone Encounter (Signed)
OPENED IN ERROR

## 2016-10-03 ENCOUNTER — Other Ambulatory Visit: Payer: Self-pay | Admitting: Family Medicine

## 2016-10-03 LAB — BASIC METABOLIC PANEL WITH GFR
BUN: 9 mg/dL (ref 7–25)
CHLORIDE: 100 mmol/L (ref 98–110)
CO2: 30 mmol/L (ref 20–31)
Calcium: 9.4 mg/dL (ref 8.6–10.4)
Creat: 0.71 mg/dL (ref 0.50–1.05)
GFR, Est African American: >89 mL/min (ref >=60)
GFR, Est Non African American: >89 mL/min (ref >=60)
GLUCOSE: 109 mg/dL — AB (ref 65–99)
POTASSIUM: 4.3 mmol/L (ref 3.5–5.3)
Sodium: 139 mmol/L (ref 135–146)

## 2016-10-04 LAB — HEMOGLOBIN A1C
Hgb A1c MFr Bld: 7.1 % — ABNORMAL HIGH (ref <5.7)
Mean Plasma Glucose: 157 mg/dL

## 2016-10-05 ENCOUNTER — Encounter: Payer: Self-pay | Admitting: Family Medicine

## 2016-10-05 ENCOUNTER — Ambulatory Visit (INDEPENDENT_AMBULATORY_CARE_PROVIDER_SITE_OTHER): Payer: Managed Care, Other (non HMO) | Admitting: Family Medicine

## 2016-10-05 VITALS — BP 118/80 | HR 92 | Temp 98.3°F | Resp 18 | Ht 67.0 in | Wt 370.0 lb

## 2016-10-05 DIAGNOSIS — E1140 Type 2 diabetes mellitus with diabetic neuropathy, unspecified: Secondary | ICD-10-CM

## 2016-10-05 DIAGNOSIS — K219 Gastro-esophageal reflux disease without esophagitis: Secondary | ICD-10-CM

## 2016-10-05 DIAGNOSIS — F5101 Primary insomnia: Secondary | ICD-10-CM | POA: Diagnosis not present

## 2016-10-05 DIAGNOSIS — R232 Flushing: Secondary | ICD-10-CM | POA: Diagnosis not present

## 2016-10-05 DIAGNOSIS — F4323 Adjustment disorder with mixed anxiety and depressed mood: Secondary | ICD-10-CM | POA: Diagnosis not present

## 2016-10-05 DIAGNOSIS — J302 Other seasonal allergic rhinitis: Secondary | ICD-10-CM

## 2016-10-05 DIAGNOSIS — I10 Essential (primary) hypertension: Secondary | ICD-10-CM | POA: Diagnosis not present

## 2016-10-05 MED ORDER — PHENTERMINE HCL 37.5 MG PO TABS: 37.5000 mg | ORAL_TABLET | Freq: Every day | ORAL | 3 refills | Status: DC

## 2016-10-05 MED ORDER — PHENTERMINE HCL 37.5 MG PO CAPS: 37.5000 mg | ORAL_CAPSULE | Freq: Every morning | ORAL | 0 refills | Status: DC

## 2016-10-05 MED ORDER — LOSARTAN POTASSIUM 100 MG PO TABS: 100.0000 mg | ORAL_TABLET | Freq: Every day | ORAL | 3 refills | Status: DC

## 2016-10-05 NOTE — Patient Instructions (Signed)
F/u in 4 month, call if you need before  New is daily phentermine to help with weight loss, I expect you to lose 12 to 16 pounds in next 4 months   Commit to at least 3 meals per day with 75% vegetable an fruit at lunch and dinner  It is important that you exercise regularly at least 30 minutes 5 times a week. If you develop chest pain, have severe difficulty breathing, or feel very tired, stop exercising immediately and seek medical attention    Please work on good  health habits so that your health will improve. 1. Commitment to daily physical activity for 30 to 60  minutes, if you are able to do this.  2. Commitment to wise food choices. Aim for half of your  food intake to be vegetable and fruit, one quarter starchy foods, and one quarter protein. Try to eat on a regular schedule  3 meals per day, snacking between meals should be limited to vegetables or fruits or small portions of nuts. 64 ounces of water per day is generally recommended, unless you have specific health conditions, like heart failure or kidney failure where you will need to limit fluid intake.  3. Commitment to sufficient and a  good quality of physical and mental rest daily, generally between 6 to 8 hours per day.  WITH PERSISTANCE AND PERSEVERANCE, THE IMPOSSIBLE , BECOMES THE NORM!

## 2016-10-07 ENCOUNTER — Encounter: Payer: Self-pay | Admitting: Family Medicine

## 2016-10-07 ENCOUNTER — Other Ambulatory Visit: Payer: Self-pay | Admitting: Family Medicine

## 2016-10-07 MED ORDER — DAPAGLIFLOZIN PROPANEDIOL 10 MG PO TABS: 10.0000 mg | ORAL_TABLET | Freq: Every day | ORAL | 5 refills | Status: DC

## 2016-10-07 NOTE — Assessment & Plan Note (Addendum)
[name] is reminded of the importance of commitment to daily physical activity for 30 minutes or more, as able and the need to limit carbohydrate intake to 30 to 60 grams per meal to help with blood sugar control.   The need to take medication as prescribed, test blood sugar as directed, and to call between visits if there is a concern that blood sugar is uncontrolled is also discussed.   [name] is reminded of the importance of daily foot exam, annual eye examination, and good blood sugar, blood pressure and cholesterol control.  Deteriorated, add farxiga  Diabetic Labs Latest Ref Rng & Units 06/28/2016 06/25/2016 01/27/2016 06/23/2015 06/22/2015  HbA1c <5.7 % SEE NOTE 7.0(H) 7.2(H) 6.9(H) 7.0(H)  Microalbumin Not estab mg/dL - - - 1.6 -  Micro/Creat Ratio <30 mcg/mg creat - - - 8 -  Chol <200 mg/dL - 178 - 162 -  HDL >50 mg/dL - 73 - 64 -  Calc LDL <100 mg/dL - 89 - 71 -  Triglycerides <150 mg/dL - 81 - 135 -  Creatinine 0.50 - 1.05 mg/dL CANCELED - 0.72 0.74 0.62   BP/Weight 10/05/2016 07/22/2016 06/28/2016 04/08/2016 02/23/2016 02/01/2016 6/71/2458  Systolic BP 099 833 825 053 976 734 -  Diastolic BP 80 93 82 76 80 80 -  Wt. (Lbs) 370 363 369 362 362.5 363 368.6  BMI 57.95 57.71 57.79 58.43 57.63 58.62 59.52   Foot/eye exam completion dates Latest Ref Rng & Units 02/01/2016 04/07/2015  Eye Exam No Retinopathy - No Retinopathy  Foot exam Order - - -  Foot Form Completion - Done -

## 2016-10-09 ENCOUNTER — Encounter: Payer: Self-pay | Admitting: Family Medicine

## 2016-10-09 NOTE — Assessment & Plan Note (Signed)
Controlled, no change in medication  

## 2016-10-09 NOTE — Assessment & Plan Note (Signed)
Sleep hygiene reviewed and written information offered also. Prescription sent for  medication needed.  

## 2016-10-09 NOTE — Assessment & Plan Note (Signed)
.  conb1 DASH diet and commitment to daily physical activity for a minimum of 30 minutes discussed and encouraged, as a part of hypertension management. The importance of attaining a healthy weight is also discussed.  BP/Weight 10/05/2016 07/22/2016 06/28/2016 04/08/2016 02/23/2016 02/01/2016 2/99/3716  Systolic BP 967 893 810 175 102 585 -  Diastolic BP 80 93 82 76 80 80 -  Wt. (Lbs) 370 363 369 362 362.5 363 368.6  BMI 57.95 57.71 57.79 58.43 57.63 58.62 59.52

## 2016-10-09 NOTE — Assessment & Plan Note (Signed)
Deteriorated. Patient re-educated about  the importance of commitment to a  minimum of 150 minutes of exercise per week.  The importance of healthy food choices with portion control discussed. Encouraged to start a food diary, count calories and to consider  joining a support group. Sample diet sheets offered. Goals set by the patient for the next several months.   Weight /BMI 10/05/2016 07/22/2016 06/28/2016  WEIGHT 370 lb 363 lb 369 lb  HEIGHT 5\' 7"  5' 6.5" 5\' 7"   BMI 57.95 kg/m2 57.71 kg/m2 57.79 kg/m2  Start  Phentermine half daily

## 2016-10-09 NOTE — Progress Notes (Signed)
[name]     MRN: [id number]      DOB: 02-20-1966   HPI [name] is here for follow up and re-evaluation of chronic medical conditions, medication management and review of any available recent lab and radiology data.  Preventive health is updated, specifically  Cancer screening and Immunization.   Questions or concerns regarding consultations or procedures which the PT has had in the interim are  addressed. The PT denies any adverse reactions to current medications since the last visit.  c/o continuing weight gain, wishes to start phentermine to help with curbing her appetite so that she can lose weight, also interested in [place] Denies polyuria, polydipsia, blurred vision , or hypoglycemic episodes.  ROS Denies recent fever or chills. Denies sinus pressure, nasal congestion, ear pain or sore throat. Denies chest congestion, productive cough or wheezing. Denies chest pains, palpitations and leg swelling Denies abdominal pain, nausea, vomiting,diarrhea or constipation.   Denies dysuria, frequency, hesitancy or incontinence. Denies joint pain, swelling and limitation in mobility. Denies headaches, seizures, numbness, or tingling. Denies depression, anxiety or insomnia. Denies skin break down or rash.   PE  BP 118/80 (BP Location: Left Arm, Patient Position: Sitting, Cuff Size: Large)   Pulse 92   Temp 98.3 F (36.8 C) (Temporal)   Resp 18   Ht 5\' 7"  (1.702 m)   Wt (!) 370 lb (167.8 kg)   SpO2 94%   BMI 57.95 kg/m   Patient alert and oriented and in no cardiopulmonary distress.  HEENT: No facial asymmetry, EOMI,   oropharynx pink and moist.  Neck supple no JVD, no mass.  Chest: Clear to auscultation bilaterally.  CVS: S1, S2 no murmurs, no S3.Regular rate.  ABD: Soft non tender.   Ext: No edema  MS: Adequate ROM spine, shoulders, hips and knees.  Skin: Intact, no ulcerations or rash noted.  Psych: Good eye contact, normal affect. Memory intact not anxious  or depressed appearing.  CNS: CN 2-12 intact, power,  normal throughout.no focal deficits noted.   Assessment & Plan  Diabetes mellitus with neuropathy ([place]) [name] is reminded of the importance of commitment to daily physical activity for 30 minutes or more, as able and the need to limit carbohydrate intake to 30 to 60 grams per meal to help with blood sugar control.   The need to take medication as prescribed, test blood sugar as directed, and to call between visits if there is a concern that blood sugar is uncontrolled is also discussed.   [name] is reminded of the importance of daily foot exam, annual eye examination, and good blood sugar, blood pressure and cholesterol control.  Deteriorated, add farxiga  Diabetic Labs Latest Ref Rng & Units 06/28/2016 06/25/2016 01/27/2016 06/23/2015 06/22/2015  HbA1c <5.7 % SEE NOTE 7.0(H) 7.2(H) 6.9(H) 7.0(H)  Microalbumin Not estab mg/dL - - - 1.6 -  Micro/Creat Ratio <30 mcg/mg creat - - - 8 -  Chol <200 mg/dL - 178 - 162 -  HDL >50 mg/dL - 73 - 64 -  Calc LDL <100 mg/dL - 89 - 71 -  Triglycerides <150 mg/dL - 81 - 135 -  Creatinine 0.50 - 1.05 mg/dL CANCELED - 0.72 0.74 0.62   BP/Weight 10/05/2016 07/22/2016 06/28/2016 04/08/2016 02/23/2016 02/01/2016 03/18/538  Systolic BP 767 341 937 902 409 735 -  Diastolic BP 80 93 82 76 80 80 -  Wt. (Lbs) 370 363 369 362 362.5 363 368.6  BMI 57.95 57.71 57.79 58.43 57.63  58.62 59.52   Foot/eye exam completion dates Latest Ref Rng & Units 02/01/2016 04/07/2015  Eye Exam No Retinopathy - No Retinopathy  Foot exam Order - - -  Foot Form Completion - Done -        HTN, goal below 130/80 .conb1 DASH diet and commitment to daily physical activity for a minimum of 30 minutes discussed and encouraged, as a part of hypertension management. The importance of attaining a healthy weight is also discussed.  BP/Weight 10/05/2016 07/22/2016 06/28/2016 04/08/2016 02/23/2016 02/01/2016 09/12/7586  Systolic BP 325  498 264 158 309 407 -  Diastolic BP 80 93 82 76 80 80 -  Wt. (Lbs) 370 363 369 362 362.5 363 368.6  BMI 57.95 57.71 57.79 58.43 57.63 58.62 59.52       Morbid obesity Deteriorated. Patient re-educated about  the importance of commitment to a  minimum of 150 minutes of exercise per week.  The importance of healthy food choices with portion control discussed. Encouraged to start a food diary, count calories and to consider  joining a support group. Sample diet sheets offered. Goals set by the patient for the next several months.   Weight /BMI 10/05/2016 07/22/2016 06/28/2016  WEIGHT 370 lb 363 lb 369 lb  HEIGHT 5\' 7"  5' 6.5" 5\' 7"   BMI 57.95 kg/m2 57.71 kg/m2 57.79 kg/m2  Start  Phentermine half daily    Seasonal allergies Controlled, no change in medication   Hot flashes Controlled, no change in medication   ADJ DISORDER WITH MIXED ANXIETY & DEPRESSED MOOD Controlled, no change in medication   GERD Controlled, no change in medication   Insomnia Sleep hygiene reviewed and written information offered also. Prescription sent for  medication needed.

## 2016-10-28 ENCOUNTER — Other Ambulatory Visit: Payer: Self-pay | Admitting: Family Medicine

## 2016-11-08 ENCOUNTER — Other Ambulatory Visit: Payer: Self-pay

## 2016-11-08 DIAGNOSIS — I10 Essential (primary) hypertension: Secondary | ICD-10-CM

## 2016-11-08 MED ORDER — FUROSEMIDE 40 MG PO TABS: 40.0000 mg | ORAL_TABLET | Freq: Every day | ORAL | 4 refills | Status: DC

## 2016-11-08 MED ORDER — FLUOXETINE HCL 10 MG PO TABS: 10.0000 mg | ORAL_TABLET | Freq: Every day | ORAL | 4 refills | Status: DC

## 2016-11-17 IMAGING — CR DG CHEST 2V
2 series · 2 of 2 positions shown · non-contrast
Comparison: None.

CLINICAL DATA: Dyspnea

EXAM:
CHEST - 2 VIEW

[view not recorded (1 of 2)]
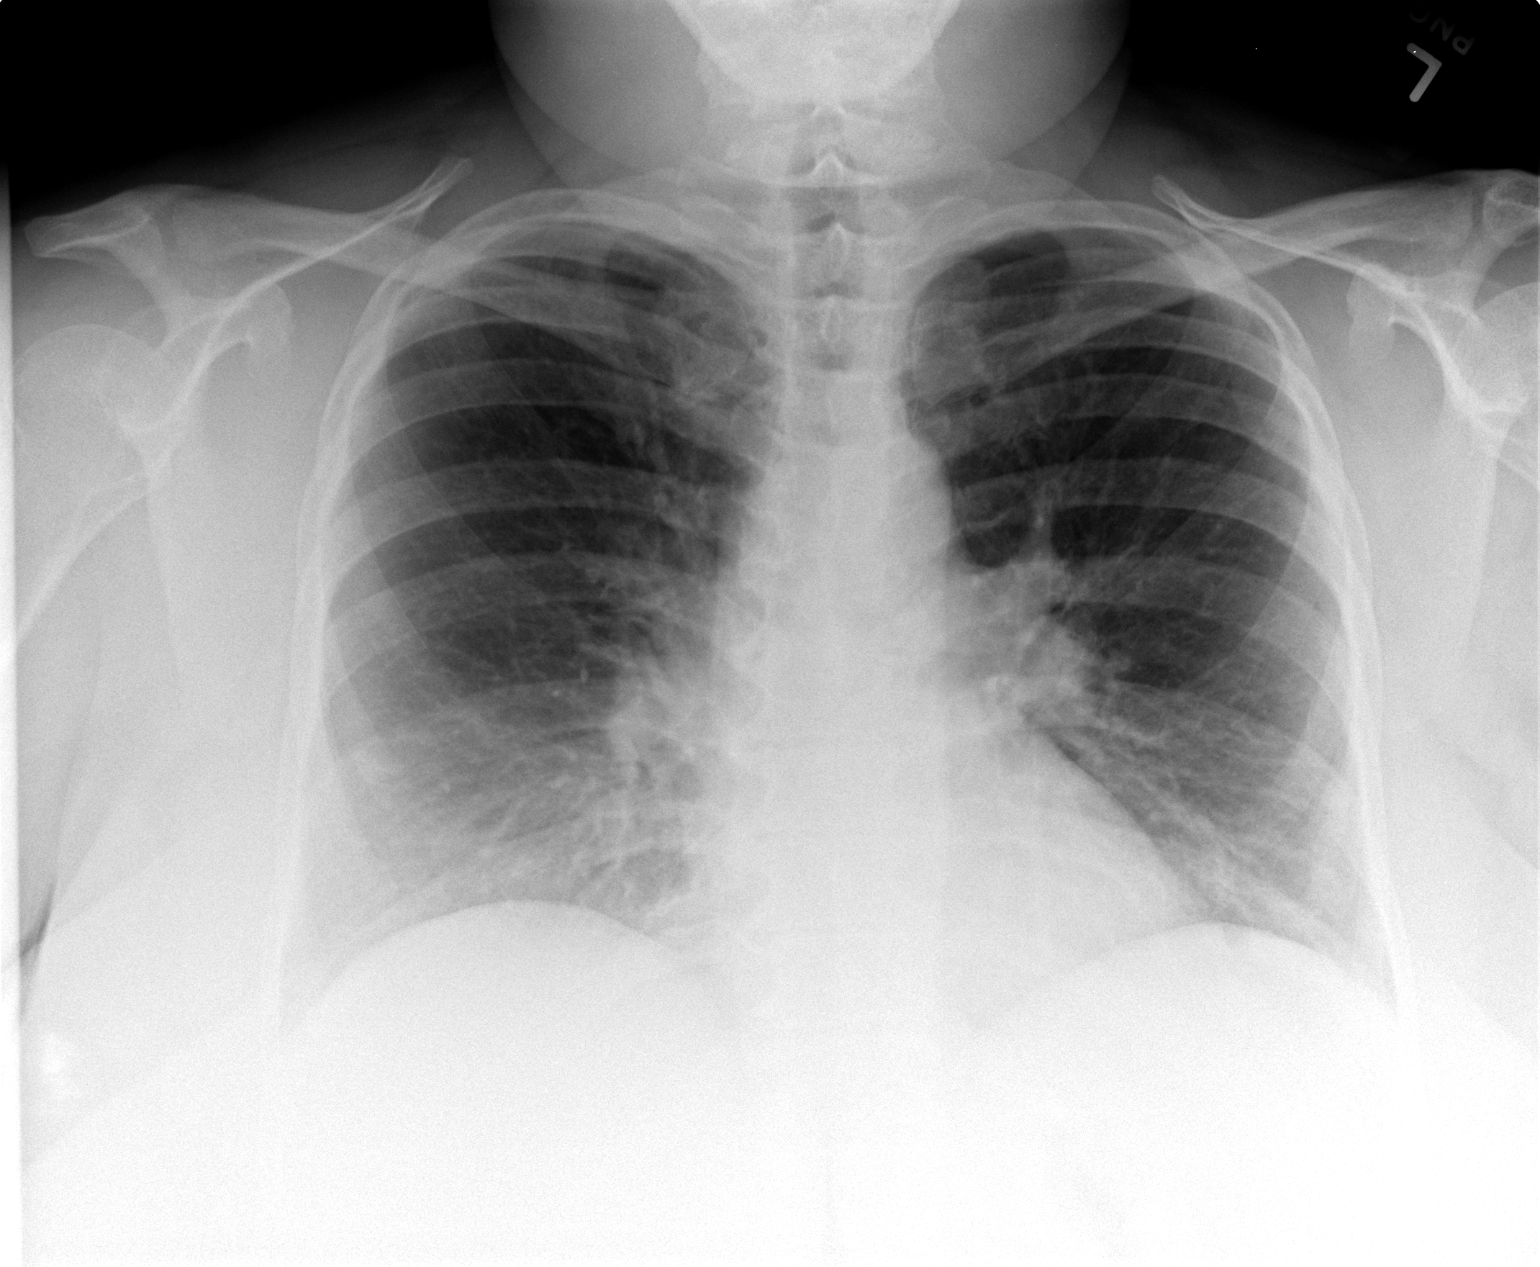

[view not recorded (2 of 2)]
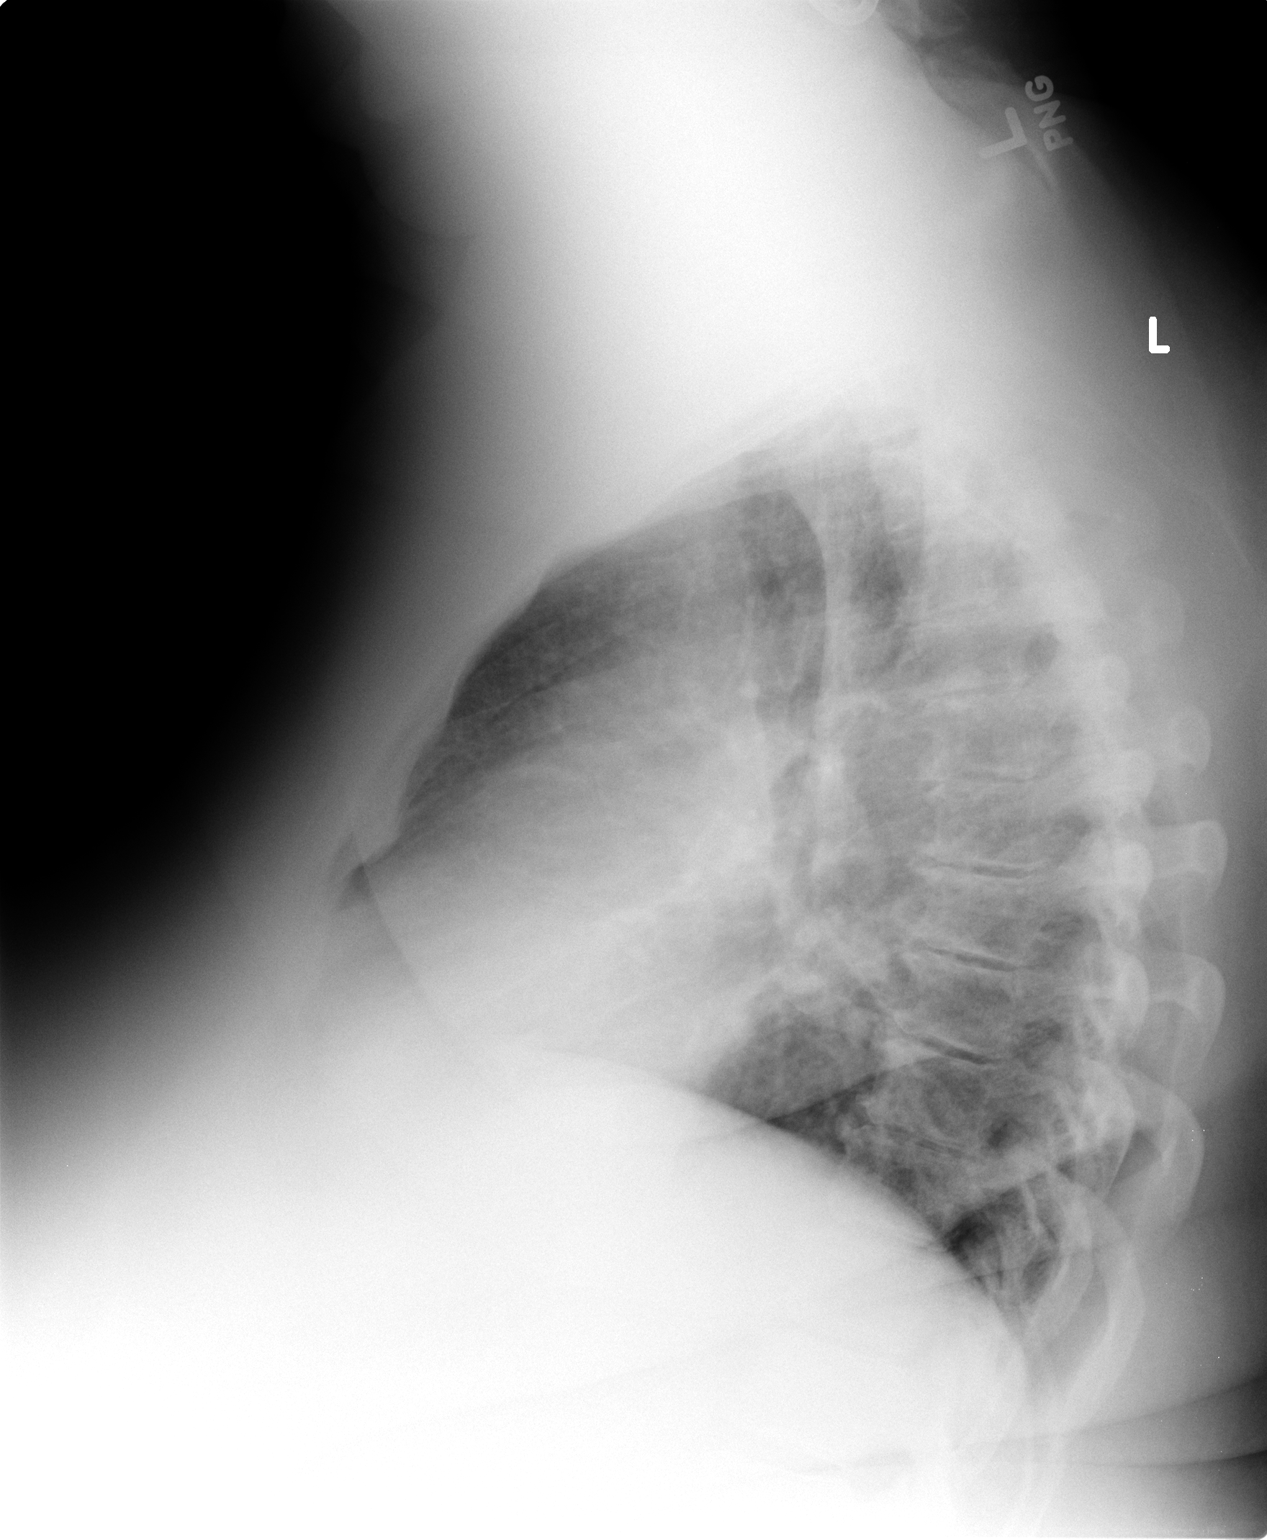

[2 of 2 positions shown; findings below may reference images not displayed]

FINDINGS: The heart size and mediastinal contours are within normal limits.
Both lungs are clear. The visualized skeletal structures are
unremarkable.
IMPRESSION: No active disease.

## 2016-12-24 ENCOUNTER — Other Ambulatory Visit: Payer: Self-pay | Admitting: Family Medicine

## 2016-12-25 ENCOUNTER — Other Ambulatory Visit: Payer: Self-pay | Admitting: Family Medicine

## 2017-02-01 ENCOUNTER — Ambulatory Visit: Payer: Self-pay | Admitting: Family Medicine

## 2017-02-01 ENCOUNTER — Telehealth: Payer: Self-pay

## 2017-02-01 DIAGNOSIS — E1140 Type 2 diabetes mellitus with diabetic neuropathy, unspecified: Secondary | ICD-10-CM

## 2017-02-01 DIAGNOSIS — I10 Essential (primary) hypertension: Secondary | ICD-10-CM

## 2017-02-01 NOTE — Telephone Encounter (Signed)
Labs done.

## 2017-02-10 LAB — COMPLETE METABOLIC PANEL WITH GFR
ALBUMIN: 3.5 g/dL — AB (ref 3.6–5.1)
ALT: 9 U/L (ref 6–29)
AST: 10 U/L (ref 10–35)
Alkaline Phosphatase: 78 U/L (ref 33–130)
BUN: 9 mg/dL (ref 7–25)
CALCIUM: 8.8 mg/dL (ref 8.6–10.4)
CO2: 21 mmol/L (ref 20–31)
Chloride: 103 mmol/L (ref 98–110)
Creat: 0.64 mg/dL (ref 0.50–1.05)
Glucose, Bld: 111 mg/dL — ABNORMAL HIGH (ref 65–99)
POTASSIUM: 4.5 mmol/L (ref 3.5–5.3)
SODIUM: 140 mmol/L (ref 135–146)
Total Bilirubin: 0.3 mg/dL (ref 0.2–1.2)
Total Protein: 6.4 g/dL (ref 6.1–8.1)

## 2017-02-10 LAB — HEMOGLOBIN A1C
HEMOGLOBIN A1C: 7.5 % — AB (ref <5.7)
MEAN PLASMA GLUCOSE: 169 mg/dL

## 2017-02-10 LAB — LIPID PANEL
CHOLESTEROL: 188 mg/dL (ref <200)
HDL: 66 mg/dL (ref >50)
LDL Cholesterol: 97 mg/dL (ref <100)
TRIGLYCERIDES: 123 mg/dL (ref <150)
Total CHOL/HDL Ratio: 2.8 Ratio (ref <5.0)
VLDL: 25 mg/dL (ref <30)

## 2017-02-13 ENCOUNTER — Ambulatory Visit (INDEPENDENT_AMBULATORY_CARE_PROVIDER_SITE_OTHER): Payer: Managed Care, Other (non HMO) | Admitting: Family Medicine

## 2017-02-13 ENCOUNTER — Encounter: Payer: Self-pay | Admitting: Family Medicine

## 2017-02-13 VITALS — BP 124/80 | HR 97 | Resp 16 | Ht 67.0 in | Wt 372.0 lb

## 2017-02-13 DIAGNOSIS — I10 Essential (primary) hypertension: Secondary | ICD-10-CM

## 2017-02-13 DIAGNOSIS — E1140 Type 2 diabetes mellitus with diabetic neuropathy, unspecified: Secondary | ICD-10-CM | POA: Diagnosis not present

## 2017-02-13 DIAGNOSIS — M542 Cervicalgia: Secondary | ICD-10-CM | POA: Diagnosis not present

## 2017-02-13 DIAGNOSIS — J4520 Mild intermittent asthma, uncomplicated: Secondary | ICD-10-CM | POA: Diagnosis not present

## 2017-02-13 DIAGNOSIS — F321 Major depressive disorder, single episode, moderate: Secondary | ICD-10-CM | POA: Diagnosis not present

## 2017-02-13 DIAGNOSIS — Z6841 Body Mass Index (BMI) 40.0 and over, adult: Secondary | ICD-10-CM

## 2017-02-13 MED ORDER — BUDESONIDE-FORMOTEROL FUMARATE 80-4.5 MCG/ACT IN AERO: 2.0000 | INHALATION_SPRAY | Freq: Two times a day (BID) | RESPIRATORY_TRACT | 3 refills | Status: DC

## 2017-02-13 MED ORDER — PREDNISONE 5 MG (21) PO TBPK: 5.0000 mg | ORAL_TABLET | ORAL | 0 refills | Status: DC

## 2017-02-13 MED ORDER — FLUOXETINE HCL 20 MG PO TABS: 20.0000 mg | ORAL_TABLET | Freq: Every day | ORAL | 3 refills | Status: DC

## 2017-02-13 MED ORDER — METHYLPREDNISOLONE ACETATE 80 MG/ML IJ SUSP
80.0000 mg | Freq: Once | INTRAMUSCULAR | Status: AC
  Administered 2017-02-13: 80 mg via INTRAMUSCULAR

## 2017-02-13 NOTE — Patient Instructions (Signed)
F/u in 12.5   Weeks , call if you need me before  You need to schedule eye exam , this is past due  Injection today for neck and left arm pain and 6 day course of prednisone is also prescribed Use muscle relaxant one at bedtime for neck spasm, if left shoulder pain persists , you will be referred to orthopedic Doc  New for breathing is twice daily symbicort, continue proair for RESCUE use only  Increase fluoxetine dose to 20 m,g daily and you are referred to psychiatry  Please call your ins , they will likely pay for su[pport group likje weight watchers for help with weight loss or counselling in person or over the phone  Blood sugar has increased, no change needed in medication, just  achange in eatuing  Habits  Non fast chem 7 and eGFR and HBA1C 1 wek before follow up

## 2017-02-13 NOTE — Progress Notes (Signed)
[name]     MRN: [id number]      DOB: [birth year]   HPI [name] is here for follow up and re-evaluation of chronic medical conditions, medication management and review of any available recent lab and radiology data.  Preventive health is updated, specifically  Cancer screening and Immunization.   Questions or concerns regarding consultations or procedures which the PT has had in the interim are  addressed. The PT denies any adverse reactions to current medications since the last visit.  3 month h/o increased wheezing uses proventil on avg 3 times per week, gen happens in the Summer wants a 2nd inhaler, spouse smokes, she has stopped x 3 years Neck and left shoulder and arm pain rated at an 8 limiting movement , no direct trauma, x 4 months, sometimes awakens pt, limits neck movement to  left  ROS Denies recent fever or chills. Denies sinus pressure, nasal congestion, ear pain or sore throat. Denies chest congestion, productive cough  Denies chest pains, palpitations and leg swelling Denies abdominal pain, nausea, vomiting,diarrhea or constipation.   Denies dysuria, frequency, hesitancy or incontinence.  Denies headaches, seizures, numbness, or tingling. C/o depression, anxiety uncontrolled eating and weight gain, wants help Denies skin break down or rash.   PE  BP 124/80   Pulse 97   Resp 16   Ht 5\' 7"  (1.702 m)   Wt (!) 372 lb (168.7 kg)   SpO2 93%   BMI 58.26 kg/m   Patient alert and oriented and in no cardiopulmonary distress.  HEENT: No facial asymmetry, EOMI,   oropharynx pink and moist.  Neck decreased ROM with left trapezius spasm,no JVD, no mass.  Chest: Adequate though reduced air entry, bilateral high pitched wheezes.  CVS: S1, S2 no murmurs, no S3.Regular rate.  ABD: Soft non tender.   Ext: No edema  MS: Adequate though reduced  ROM lumbar  Spine,normal in  shoulders, hips and knees.  Skin: Intact, no ulcerations or rash noted.  Psych: Good  eye contact, tearful  affect. Memory intact  anxious and mildly depressed appearing.  CNS: CN 2-12 intact, power,  normal throughout.no focal deficits noted.   Assessment & Plan  Depression, major, single episode, moderate (H[facility]) Ready for therapy, wants help referred to psychiatry, pt lives in [place] and works in [place] so facility in [place]  BMI 50.0-59.9, adult ([place]) Deteriorated. Patient re-educated about  the importance of commitment to a  minimum of 150 minutes of exercise per week.  The importance of healthy food choices with portion control discussed. Encouraged to start a food diary, count calories and to consider  joining a support group. Sample diet sheets offered. Goals set by the patient for the next several months.   Weight /BMI 02/13/2017 10/05/2016 07/22/2016  WEIGHT 372 lb 370 lb 363 lb  HEIGHT 5\' 7"  5\' 7"  5' 6.5"  BMI 58.26 kg/m2 57.95 kg/m2 57.71 kg/m2      Mild intermittent reactive airway disease with wheezing without complication Uncontrolled with excess wheezing. Depo medrol I office followed by short term prednisone , start symbicort daily and singulair  HTN, goal below 130/80 Controlled, no change in medication DASH diet and commitment to daily physical activity for a minimum of 30 minutes discussed and encouraged, as a part of hypertension management. The importance of attaining a healthy weight is also discussed.  BP/Weight 02/13/2017 10/05/2016 07/22/2016 06/28/2016 04/08/2016 02/23/2016 1/96/2229  Systolic BP 798 921 194 174 132 118 118  Diastolic BP 80 80 93 82 76 80 80  Wt. (Lbs) 372 370 363 369 362 362.5 363  BMI 58.26 57.95 57.71 57.79 58.43 57.63 58.62       Diabetes mellitus with neuropathy (HCC) [name] is reminded of the importance of commitment to daily physical activity for 30 minutes or more, as able and the need to limit carbohydrate intake to 30 to 60 grams per meal to help with blood sugar control.   The need to  take medication as prescribed, test blood sugar as directed, and to call between visits if there is a concern that blood sugar is uncontrolled is also discussed.   [name] is reminded of the importance of daily foot exam, annual eye examination, and good blood sugar, blood pressure and cholesterol control. Deteriorated, inc metformin to twice daily and reduce carb intake  Diabetic Labs Latest Ref Rng & Units 02/09/2017 10/03/2016 06/28/2016 06/25/2016 01/27/2016  HbA1c <5.7 % 7.5(H) 7.1(H) SEE NOTE 7.0(H) 7.2(H)  Microalbumin Not estab mg/dL - - - - -  Micro/Creat Ratio <30 mcg/mg creat - - - - -  Chol <200 mg/dL 188 - - 178 -  HDL >50 mg/dL 66 - - 73 -  Calc LDL <100 mg/dL 97 - - 89 -  Triglycerides <150 mg/dL 123 - - 81 -  Creatinine 0.50 - 1.05 mg/dL 0.64 0.71 CANCELED - 0.72   BP/Weight 02/13/2017 10/05/2016 07/22/2016 06/28/2016 04/08/2016 02/23/2016 8/78/6767  Systolic BP 209 470 962 836 629 476 546  Diastolic BP 80 80 93 82 76 80 80  Wt. (Lbs) 372 370 363 369 362 362.5 363  BMI 58.26 57.95 57.71 57.79 58.43 57.63 58.62   Foot/eye exam completion dates Latest Ref Rng & Units 02/13/2017 02/01/2016  Eye Exam No Retinopathy - -  Foot exam Order - - -  Foot Form Completion - Done Done        Neck pain on left side 4 month h/o progressive symptoms, anti inflammatory course and muscle relaxant

## 2017-02-15 ENCOUNTER — Other Ambulatory Visit: Payer: Self-pay | Admitting: Family Medicine

## 2017-02-25 DIAGNOSIS — F321 Major depressive disorder, single episode, moderate: Secondary | ICD-10-CM | POA: Insufficient documentation

## 2017-02-25 DIAGNOSIS — M542 Cervicalgia: Secondary | ICD-10-CM | POA: Insufficient documentation

## 2017-02-25 NOTE — Assessment & Plan Note (Signed)
Controlled, no change in medication DASH diet and commitment to daily physical activity for a minimum of 30 minutes discussed and encouraged, as a part of hypertension management. The importance of attaining a healthy weight is also discussed.  BP/Weight 02/13/2017 10/05/2016 07/22/2016 06/28/2016 04/08/2016 02/23/2016 11/25/5463  Systolic BP 681 275 170 017 494 496 759  Diastolic BP 80 80 93 82 76 80 80  Wt. (Lbs) 372 370 363 369 362 362.5 363  BMI 58.26 57.95 57.71 57.79 58.43 57.63 58.62

## 2017-02-25 NOTE — Assessment & Plan Note (Signed)
Ready for therapy, wants help referred to psychiatry, pt lives in [place] and works in [place] so facility in [place] area desired

## 2017-02-25 NOTE — Assessment & Plan Note (Signed)
Deteriorated. Patient re-educated about  the importance of commitment to a  minimum of 150 minutes of exercise per week.  The importance of healthy food choices with portion control discussed. Encouraged to start a food diary, count calories and to consider  joining a support group. Sample diet sheets offered. Goals set by the patient for the next several months.   Weight /BMI 02/13/2017 10/05/2016 07/22/2016  WEIGHT 372 lb 370 lb 363 lb  HEIGHT 5\' 7"  5\' 7"  5' 6.5"  BMI 58.26 kg/m2 57.95 kg/m2 57.71 kg/m2

## 2017-02-25 NOTE — Assessment & Plan Note (Signed)
[name] is reminded of the importance of commitment to daily physical activity for 30 minutes or more, as able and the need to limit carbohydrate intake to 30 to 60 grams per meal to help with blood sugar control.   The need to take medication as prescribed, test blood sugar as directed, and to call between visits if there is a concern that blood sugar is uncontrolled is also discussed.   [name] is reminded of the importance of daily foot exam, annual eye examination, and good blood sugar, blood pressure and cholesterol control. Deteriorated, inc metformin to twice daily and reduce carb intake  Diabetic Labs Latest Ref Rng & Units 02/09/2017 10/03/2016 06/28/2016 06/25/2016 01/27/2016  HbA1c <5.7 % 7.5(H) 7.1(H) SEE NOTE 7.0(H) 7.2(H)  Microalbumin Not estab mg/dL - - - - -  Micro/Creat Ratio <30 mcg/mg creat - - - - -  Chol <200 mg/dL 188 - - 178 -  HDL >50 mg/dL 66 - - 73 -  Calc LDL <100 mg/dL 97 - - 89 -  Triglycerides <150 mg/dL 123 - - 81 -  Creatinine 0.50 - 1.05 mg/dL 0.64 0.71 CANCELED - 0.72   BP/Weight 02/13/2017 10/05/2016 07/22/2016 06/28/2016 04/08/2016 02/23/2016 08/05/1622  Systolic BP 469 507 225 750 518 335 825  Diastolic BP 80 80 93 82 76 80 80  Wt. (Lbs) 372 370 363 369 362 362.5 363  BMI 58.26 57.95 57.71 57.79 58.43 57.63 58.62   Foot/eye exam completion dates Latest Ref Rng & Units 02/13/2017 02/01/2016  Eye Exam No Retinopathy - -  Foot exam Order - - -  Foot Form Completion - Done Done

## 2017-02-25 NOTE — Assessment & Plan Note (Signed)
Uncontrolled with excess wheezing. Depo medrol I office followed by short term prednisone , start symbicort daily and singulair

## 2017-02-25 NOTE — Assessment & Plan Note (Signed)
4 month h/o progressive symptoms, anti inflammatory course and muscle relaxant

## 2017-02-27 ENCOUNTER — Telehealth (HOSPITAL_COMMUNITY): Payer: Self-pay

## 2017-05-25 LAB — BASIC METABOLIC PANEL WITH GFR
BUN: 13 mg/dL (ref 7–25)
CALCIUM: 9.1 mg/dL (ref 8.6–10.4)
CO2: 30 mmol/L (ref 20–32)
CREATININE: 0.79 mg/dL (ref 0.50–1.05)
Chloride: 103 mmol/L (ref 98–110)
GFR, EST AFRICAN AMERICAN: 100 mL/min/{1.73_m2} (ref >=60)
GFR, EST NON AFRICAN AMERICAN: 87 mL/min/{1.73_m2} (ref >=60)
Glucose, Bld: 161 mg/dL — ABNORMAL HIGH (ref 65–139)
POTASSIUM: 4.4 mmol/L (ref 3.5–5.3)
Sodium: 141 mmol/L (ref 135–146)

## 2017-05-25 LAB — HEMOGLOBIN A1C
HEMOGLOBIN A1C: 7.3 %{Hb} — AB (ref <5.7)
Mean Plasma Glucose: 163 (calc)
eAG (mmol/L): 9 (calc)

## 2017-05-29 ENCOUNTER — Encounter (HOSPITAL_COMMUNITY): Payer: Self-pay | Admitting: Emergency Medicine

## 2017-05-29 ENCOUNTER — Emergency Department (HOSPITAL_COMMUNITY): Payer: Managed Care, Other (non HMO)

## 2017-05-29 ENCOUNTER — Other Ambulatory Visit: Payer: Self-pay

## 2017-05-29 ENCOUNTER — Encounter: Payer: Self-pay | Admitting: Family Medicine

## 2017-05-29 ENCOUNTER — Emergency Department (HOSPITAL_COMMUNITY)
Admission: EM | Admit: 2017-05-29 | Discharge: 2017-05-29 | Disposition: A | Discharge status: Home / Self Care | Payer: Managed Care, Other (non HMO) | Attending: Emergency Medicine | Admitting: Emergency Medicine

## 2017-05-29 ENCOUNTER — Ambulatory Visit (INDEPENDENT_AMBULATORY_CARE_PROVIDER_SITE_OTHER): Payer: Managed Care, Other (non HMO) | Admitting: Family Medicine

## 2017-05-29 VITALS — BP 124/84 | HR 89 | Resp 16 | Ht 67.0 in | Wt 380.0 lb

## 2017-05-29 DIAGNOSIS — E1140 Type 2 diabetes mellitus with diabetic neuropathy, unspecified: Secondary | ICD-10-CM | POA: Insufficient documentation

## 2017-05-29 DIAGNOSIS — Z23 Encounter for immunization: Secondary | ICD-10-CM

## 2017-05-29 DIAGNOSIS — J449 Chronic obstructive pulmonary disease, unspecified: Secondary | ICD-10-CM | POA: Diagnosis not present

## 2017-05-29 DIAGNOSIS — R0602 Shortness of breath: Secondary | ICD-10-CM

## 2017-05-29 DIAGNOSIS — R0609 Other forms of dyspnea: Secondary | ICD-10-CM

## 2017-05-29 DIAGNOSIS — Z79899 Other long term (current) drug therapy: Secondary | ICD-10-CM | POA: Insufficient documentation

## 2017-05-29 DIAGNOSIS — I10 Essential (primary) hypertension: Secondary | ICD-10-CM | POA: Insufficient documentation

## 2017-05-29 DIAGNOSIS — Z87891 Personal history of nicotine dependence: Secondary | ICD-10-CM | POA: Diagnosis not present

## 2017-05-29 DIAGNOSIS — Z7984 Long term (current) use of oral hypoglycemic drugs: Secondary | ICD-10-CM | POA: Diagnosis not present

## 2017-05-29 DIAGNOSIS — J9801 Acute bronchospasm: Secondary | ICD-10-CM | POA: Insufficient documentation

## 2017-05-29 LAB — COMPREHENSIVE METABOLIC PANEL
ALK PHOS: 90 U/L (ref 38–126)
ALT: 14 U/L (ref 14–54)
AST: 17 U/L (ref 15–41)
Albumin: 3.9 g/dL (ref 3.5–5.0)
Anion gap: 17 — ABNORMAL HIGH (ref 5–15)
BUN: 10 mg/dL (ref 6–20)
CALCIUM: 10 mg/dL (ref 8.9–10.3)
CO2: 28 mmol/L (ref 22–32)
CREATININE: 0.64 mg/dL (ref 0.44–1.00)
Chloride: 98 mmol/L — ABNORMAL LOW (ref 101–111)
Glucose, Bld: 142 mg/dL — ABNORMAL HIGH (ref 65–99)
Potassium: 3.8 mmol/L (ref 3.5–5.1)
Sodium: 143 mmol/L (ref 135–145)
Total Bilirubin: 0.4 mg/dL (ref 0.3–1.2)
Total Protein: 7.2 g/dL (ref 6.5–8.1)

## 2017-05-29 LAB — URINALYSIS, ROUTINE W REFLEX MICROSCOPIC
BILIRUBIN URINE: NEGATIVE
Glucose, UA: NEGATIVE mg/dL
Hgb urine dipstick: NEGATIVE
KETONES UR: NEGATIVE mg/dL
Nitrite: NEGATIVE
PROTEIN: NEGATIVE mg/dL
Specific Gravity, Urine: 1.011 (ref 1.005–1.030)
pH: 5 (ref 5.0–8.0)

## 2017-05-29 LAB — CBC WITH DIFFERENTIAL/PLATELET
BASOS PCT: 1 %
Basophils Absolute: 0.1 10*3/uL (ref 0.0–0.1)
EOS ABS: 0.2 10*3/uL (ref 0.0–0.7)
EOS PCT: 4 %
HEMATOCRIT: 38.8 % (ref 36.0–46.0)
HEMOGLOBIN: 11.8 g/dL — AB (ref 12.0–15.0)
LYMPHS PCT: 41 %
Lymphs Abs: 2.5 10*3/uL (ref 0.7–4.0)
MCH: 22 pg — AB (ref 26.0–34.0)
MCHC: 30.4 g/dL (ref 30.0–36.0)
MCV: 72.4 fL — AB (ref 78.0–100.0)
Monocytes Absolute: 0.5 10*3/uL (ref 0.1–1.0)
Monocytes Relative: 8 %
NEUTROS ABS: 2.8 10*3/uL (ref 1.7–7.7)
Neutrophils Relative %: 46 %
PLATELETS: 242 10*3/uL (ref 150–400)
RBC: 5.36 MIL/uL — ABNORMAL HIGH (ref 3.87–5.11)
RDW: 15.7 % — ABNORMAL HIGH (ref 11.5–15.5)
WBC: 6.1 10*3/uL (ref 4.0–10.5)

## 2017-05-29 LAB — TROPONIN I

## 2017-05-29 LAB — LACTIC ACID, PLASMA: Lactic Acid, Venous: 1.5 mmol/L (ref 0.5–1.9)

## 2017-05-29 LAB — BRAIN NATRIURETIC PEPTIDE: B Natriuretic Peptide: 15 pg/mL (ref 0.0–100.0)

## 2017-05-29 MED ORDER — ALBUTEROL SULFATE (2.5 MG/3ML) 0.083% IN NEBU
2.5000 mg | INHALATION_SOLUTION | Freq: Once | RESPIRATORY_TRACT | Status: AC
  Administered 2017-05-29: 2.5 mg via RESPIRATORY_TRACT
  Filled 2017-05-29: qty 3

## 2017-05-29 MED ORDER — ALBUTEROL SULFATE (2.5 MG/3ML) 0.083% IN NEBU: 5.0000 mg | INHALATION_SOLUTION | Freq: Once | RESPIRATORY_TRACT | Status: DC

## 2017-05-29 MED ORDER — IOPAMIDOL (ISOVUE-370) INJECTION 76%
100.0000 mL | Freq: Once | INTRAVENOUS | Status: AC | PRN
  Administered 2017-05-29: 100 mL via INTRAVENOUS

## 2017-05-29 MED ORDER — IPRATROPIUM-ALBUTEROL 0.5-2.5 (3) MG/3ML IN SOLN
3.0000 mL | Freq: Once | RESPIRATORY_TRACT | Status: AC
  Administered 2017-05-29: 3 mL via RESPIRATORY_TRACT
  Filled 2017-05-29: qty 3

## 2017-05-29 MED ORDER — PREDNISONE 20 MG PO TABS: 40.0000 mg | ORAL_TABLET | Freq: Every day | ORAL | 0 refills | Status: DC

## 2017-05-29 MED ORDER — PREDNISONE 50 MG PO TABS
60.0000 mg | ORAL_TABLET | Freq: Once | ORAL | Status: AC
  Administered 2017-05-29: 22:00:00 60 mg via ORAL
  Filled 2017-05-29: qty 1

## 2017-05-29 MED ORDER — ALBUTEROL SULFATE HFA 108 (90 BASE) MCG/ACT IN AERS
4.0000 | INHALATION_SPRAY | RESPIRATORY_TRACT | Status: AC
  Administered 2017-05-29: 4 via RESPIRATORY_TRACT
  Filled 2017-05-29: qty 6.7

## 2017-05-29 NOTE — ED Triage Notes (Signed)
Pt states sob worse with exertion for 3 months. Went to PCP today and sent here for evaluation.

## 2017-05-29 NOTE — ED Notes (Signed)
Pt ambulated around the nurses desk

## 2017-05-29 NOTE — Discharge Instructions (Signed)
Take the prescriptions as directed.  Use your albuterol inhaler (2 to 4 puffs) every 4 hours for the next 7 days, then as needed for cough, wheezing, or shortness of breath.  Your CT scan showed incidental findings:  "1) Multiple bilateral pulmonary nodules. Non-contrast chest CT at 3-6 months is recommended. If the nodules are stable at time of repeat CT, then future CT at 18-24 months (from today's scan) is considered optional for low-risk patients, but is recommended for high-risk patients. This recommendation follows the consensus statement: Guidelines for Management of Incidental Pulmonary Nodules Detected on CT Images: From the Fleischner Society 2017; Radiology 2017; 284:228-243. 2. Questionable vague hypodensity in the upper pole of right kidney, uncertain if this is a false finding due to the timing of contrast bolus or if this represents a mass. Nonemergent dedicated renal CT or MRI could be considered for further evaluation."  Your regular medical doctor can follow up these findings. Call your regular medical doctor tomorrow morning to confirm your previously scheduled follow up appointment within the next 3 days.  Return to the Emergency Department immediately sooner if worsening.

## 2017-05-29 NOTE — Patient Instructions (Signed)
I recommend you go to the emergency room for further evaluation because of progressive shortness of breath, poor exercise tolerance associated with chest discomfort and generalized  Swelling.  I will write a work excuse for you today to return to work in 2 days  Flu vaccine today  Blood work shows normal kidney function and good blood sugar control, your blood pressure in the office is good

## 2017-05-29 NOTE — ED Provider Notes (Signed)
[facility] EMERGENCY DEPARTMENT Provider Note   CSN: [id number] Arrival date & time: 05/29/17  1425     History   Chief Complaint Chief Complaint  Patient presents with  . Shortness of Breath    HPI [name] is a 51 y.o. female.  HPI  Pt was seen at 1900. Per pt, c/o gradual onset and worsening of persistent SOB for the past 3 months. Has been associated with increasing pedal edema bilaterally. SOB worsens with ambulation and laying flat. Has been associated with intermittent brief "chest pains." Denies CP on exertion. Pt states she has been taking "some old lasix" without improvement in her symptoms. Pt was evaluated by her PMD today, then sent to the ED for further evaluation. Denies palpitations, no cough, no abd pain, no N/V/D, no back pain, no fevers, no calf/LE pain or unilateral swelling.    Past Medical History:  Diagnosis Date  . Allergy   . Arthritis    knees  . Chronic headaches   . Depression   . Diabetes ([place])   . GERD (gastroesophageal reflux disease)   . Herpes zoster complicated 11/4625   affecting right eye with blured vision  . Hypertension   . Nicotine addiction   . Obesity   . Sinusitis     Patient Active Problem List   Diagnosis Date Noted  . Depression, major, single episode, moderate ([place]) 02/25/2017  . Neck pain on left side 02/25/2017  . Morbid obesity ([place]) 12/08/2014  . Low back pain 11/02/2014  . Insomnia 11/02/2014  . Mild intermittent reactive airway disease with wheezing without complication 03/50/0938  . Seasonal allergies 08/06/2014  . 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 Exertional dyspnea 07/22/2013  . COPD (chronic obstructive pulmonary disease) ([place]) 07/09/2012  . Hot flashes 06/27/2011  . Diabetes mellitus with neuropathy ([place]) 11/24/2009  . Chronic fatigue 08/10/2009  . GERD 07/01/2008  . BMI 50.0-59.9, adult ([place]) 11/28/2007  . H/O nicotine dependence 11/28/2007  . HTN, goal below 130/80 11/28/2007    Past Surgical  History:  Procedure Laterality Date  . CHOLECYSTECTOMY  1995   laparoscopic  . [place] CYST  2004  . TONSILLECTOMY     child  . TUBAL LIGATION  1994    OB History    No data available       Home Medications    Prior to Admission medications   Medication Sig Start Date End Date Taking? Authorizing Provider  acetaminophen (TYLENOL) 325 MG tablet Take 650 mg by mouth as needed.    [provider]  budesonide-formoterol (SYMBICORT) 80-4.5 MCG/ACT inhaler Inhale 2 puffs into the lungs 2 (two) times daily. 02/13/17   Fayrene Helper, MD  Calcium Carbonate-Vitamin D (CALCIUM + D PO) Take 1 tablet by mouth daily.     [provider]  FLUoxetine (PROZAC) 10 MG capsule Take 10 mg daily by mouth.    [provider]  fluticasone (FLONASE) 50 MCG/ACT nasal spray USE TWO SPRAY(S) IN EACH NOSTRIL ONCE DAILY 02/01/16   Fayrene Helper, MD  furosemide (LASIX) 40 MG tablet Take 1 tablet (40 mg total) by mouth daily. 11/08/16   Fayrene Helper, MD  KLOR-CON M20 20 MEQ tablet TAKE 1 TABLET BY MOUTH ONCE DAILY AS NEEDED WITH FLUID PILL 10/28/16   Fayrene Helper, MD  loratadine (CLARITIN) 10 MG tablet Take 1 tablet (10 mg total) by mouth daily. Patient taking differently: Take 10 mg by mouth as needed.  10/21/15  Fayrene Helper, MD  losartan (COZAAR) 100 MG tablet Take 1 tablet (100 mg total) by mouth daily. 10/05/16   Fayrene Helper, MD  metFORMIN (GLUCOPHAGE) 500 MG tablet TAKE 1 TABLET BY MOUTH TWICE DAILY WITH MEALS 02/15/17   Fayrene Helper, MD  PROAIR HFA 108 [phone] Base) MCG/ACT inhaler INHALE TWO PUFFS INTO LUNGS EVERY 6 HOURS AS NEEDED FOR WHEEZING OR  SHORTNESS  OF  BREATH 09/05/16   Fayrene Helper, MD    Family History Family History  Problem Relation Age of Onset  . Diabetes Mother   . Hypertension Mother   . Stroke Mother   . Rheum arthritis Mother   . Diabetes Father   . Hypertension Father   . Rheum  arthritis Father   . Colon cancer Maternal Grandfather 39  . Hypertension Sister   . Hypertension Brother   . Heart disease Sister   . Throat cancer Maternal Uncle     Social History Social History   Tobacco Use  . Smoking status: Former Smoker    Packs/day: 1.50    Years: 30.00    Pack years: 45.00    Types: Cigarettes    Last attempt to quit: 03/05/2014    Years since quitting: 3.2  . Smokeless tobacco: Never Used  Substance Use Topics  . Alcohol use: No    Alcohol/week: 0.0 oz  . Drug use: No     Allergies   Wellbutrin [bupropion]; Ibuprofen; and Statins   Review of Systems Review of Systems ROS: Statement: All systems negative except as marked or noted in the HPI; Constitutional: Negative for fever and chills. ; ; Eyes: Negative for eye pain, redness and discharge. ; ; ENMT: Negative for ear pain, hoarseness, nasal congestion, sinus pressure and sore throat. ; ; Cardiovascular: Negative for palpitations, diaphoresis, +dyspnea and peripheral edema. ; ; Respiratory: Negative for cough, wheezing and stridor. ; ; Gastrointestinal: Negative for nausea, vomiting, diarrhea, abdominal pain, blood in stool, hematemesis, jaundice and rectal bleeding. . ; ; Genitourinary: Negative for dysuria, flank pain and hematuria. ; ; Musculoskeletal: Negative for back pain and neck pain. Negative for swelling and trauma.; ; Skin: Negative for pruritus, rash, abrasions, blisters, bruising and skin lesion.; ; Neuro: Negative for headache, lightheadedness and neck stiffness. Negative for weakness, altered level of consciousness, altered mental status, extremity weakness, paresthesias, involuntary movement, seizure and syncope.       Physical Exam Updated Vital Signs BP 107/77 (BP Location: Left Arm)   Pulse 87   Temp 98.8 F (37.1 C) (Oral)   Resp 18   Ht 5\' 7"  (1.702 m)   Wt (!) 172.4 kg (380 lb)   SpO2 96%   BMI 59.52 kg/m    Physical Exam 1905: Physical examination:  Nursing notes  reviewed; Vital signs and O2 SAT reviewed;  Constitutional: Well developed, Well nourished, Well hydrated, In no acute distress; Head:  Normocephalic, atraumatic; Eyes: EOMI, PERRL, No scleral icterus; ENMT: Mouth and pharynx normal, Mucous membranes moist; Neck: Supple, Full range of motion, No lymphadenopathy; Cardiovascular: Regular rate and rhythm, No gallop; Respiratory: Breath sounds diminished & equal bilaterally, faint wheezes.  No audible wheezing. Speaking full sentences with ease, Normal respiratory effort/excursion; Chest: Nontender, Movement normal; Abdomen: Soft, Nontender, Nondistended, Normal bowel sounds; Genitourinary: No CVA tenderness; Extremities: Pulses normal, No tenderness, +1 pedal edema bilat. No calf asymmetry.; Neuro: AA&Ox3, Major CN grossly intact.  Speech clear. No gross focal motor or sensory deficits in extremities.; Skin: Color normal, Warm,  Dry.   ED Treatments / Results  Labs (all labs ordered are listed, but only abnormal results are displayed)   EKG  EKG Interpretation  Date/Time:  Monday May 29 2017 14:36:09 EST Ventricular Rate:  76 PR Interval:  278 QRS Duration: 96 QT Interval:  418 QTC Calculation: 470 R Axis:   -56 Text Interpretation:  Sinus rhythm with 1st degree A-V block Left axis deviation Pulmonary disease pattern Incomplete right bundle branch block Septal infarct , age undetermined When compared with ECG of 07/22/2016 No significant change was found Confirmed by Francine Graven [phone]) on 05/29/2017 7:14:25 PM       Radiology   Procedures Procedures (including critical care time)  Medications Ordered in ED Medications  albuterol (PROVENTIL) (2.5 MG/3ML) 0.083% nebulizer solution 2.5 mg (not administered)  ipratropium-albuterol (DUONEB) 0.5-2.5 (3) MG/3ML nebulizer solution 3 mL (not administered)     Initial Impression / Assessment and Plan / ED Course  I have reviewed the triage vital signs and the nursing  notes.  Pertinent labs & imaging results that were available during my care of the patient were reviewed by me and considered in my medical decision making (see chart for details).  MDM Reviewed: previous chart, nursing note and vitals Reviewed previous: labs and ECG Interpretation: labs, ECG, x-ray and CT scan   Results for orders placed or performed during the hospital encounter of 05/29/17  Comprehensive metabolic panel  Result Value Ref Range   Sodium 143 135 - 145 mmol/L   Potassium 3.8 3.5 - 5.1 mmol/L   Chloride 98 (L) 101 - 111 mmol/L   CO2 28 22 - 32 mmol/L   Glucose, Bld 142 (H) 65 - 99 mg/dL   BUN 10 6 - 20 mg/dL   Creatinine, Ser 0.64 0.44 - 1.00 mg/dL   Calcium 10.0 8.9 - 10.3 mg/dL   Total Protein 7.2 6.5 - 8.1 g/dL   Albumin 3.9 3.5 - 5.0 g/dL   AST 17 15 - 41 U/L   ALT 14 14 - 54 U/L   Alkaline Phosphatase 90 38 - 126 U/L   Total Bilirubin 0.4 0.3 - 1.2 mg/dL   GFR calc non Af Amer >60 >60 mL/min   GFR calc Af Amer >60 >60 mL/min   Anion gap 17 (H) 5 - 15  Brain natriuretic peptide  Result Value Ref Range   B Natriuretic Peptide 15.0 0.0 - 100.0 pg/mL  Troponin I  Result Value Ref Range   Troponin I <0.03 <0.03 ng/mL  Lactic acid, plasma  Result Value Ref Range   Lactic Acid, Venous 1.5 0.5 - 1.9 mmol/L  CBC with Differential  Result Value Ref Range   WBC 6.1 4.0 - 10.5 K/uL   RBC 5.36 (H) 3.87 - 5.11 MIL/uL   Hemoglobin 11.8 (L) 12.0 - 15.0 g/dL   HCT 38.8 36.0 - 46.0 %   MCV 72.4 (L) 78.0 - 100.0 fL   MCH 22.0 (L) 26.0 - 34.0 pg   MCHC 30.4 30.0 - 36.0 g/dL   RDW 15.7 (H) 11.5 - 15.5 %   Platelets 242 150 - 400 K/uL   Neutrophils Relative % 46 %   Lymphocytes Relative 41 %   Monocytes Relative 8 %   Eosinophils Relative 4 %   Basophils Relative 1 %   Neutro Abs 2.8 1.7 - 7.7 K/uL   Lymphs Abs 2.5 0.7 - 4.0 K/uL   Monocytes Absolute 0.5 0.1 - 1.0 K/uL   Eosinophils Absolute 0.2 0.0 -  0.7 K/uL   Basophils Absolute 0.1 0.0 - 0.1 K/uL   RBC  Morphology TARGET CELLS    WBC Morphology ATYPICAL LYMPHOCYTES   Urinalysis, Routine w reflex microscopic  Result Value Ref Range   Color, Urine YELLOW YELLOW   APPearance HAZY (A) CLEAR   Specific Gravity, Urine 1.011 1.005 - 1.030   pH 5.0 5.0 - 8.0   Glucose, UA NEGATIVE NEGATIVE mg/dL   Hgb urine dipstick NEGATIVE NEGATIVE   Bilirubin Urine NEGATIVE NEGATIVE   Ketones, ur NEGATIVE NEGATIVE mg/dL   Protein, ur NEGATIVE NEGATIVE mg/dL   Nitrite NEGATIVE NEGATIVE   Leukocytes, UA TRACE (A) NEGATIVE   RBC / HPF 0-5 0 - 5 RBC/hpf   WBC, UA 6-30 0 - 5 WBC/hpf   Bacteria, UA RARE (A) NONE SEEN   Squamous Epithelial / LPF 6-30 (A) NONE SEEN   Mucus PRESENT    Dg Chest 2 View Result Date: 05/29/2017 CLINICAL DATA:  Shortness of breath and heaviness in chest EXAM: CHEST  2 VIEW COMPARISON:  December 31, 2014 FINDINGS: There is a 7 mm nodular opacity either in or overlying the left lower lobe. Lungs elsewhere clear. Heart is upper normal in size with mild pulmonary venous hypertension. No adenopathy. There is degenerative change in the thoracic spine. IMPRESSION: 1. 7 mm nodular opacity in or overlying the left lower lobe, not present previously. This finding warrants noncontrast enhanced chest CT to further assess. Pulmonary vascular congestion without edema or consolidation. Electronically Signed   By: Lowella Grip III M.D.   On: 05/29/2017 15:14   Ct Angio Chest Pe W/cm &/or Wo Cm Result Date: 05/29/2017 CLINICAL DATA:  Shortness of breath EXAM: CT ANGIOGRAPHY CHEST WITH CONTRAST TECHNIQUE: Multidetector CT imaging of the chest was performed using the standard protocol during bolus administration of intravenous contrast. Multiplanar CT image reconstructions and MIPs were obtained to evaluate the vascular anatomy. CONTRAST:  100 mL Isovue 370 intravenous COMPARISON:  05/29/2017 FINDINGS: Cardiovascular: Very poor contrast bolus with very Marcelli contrast in the pulmonary arterial system. Large  body habitus results in overall grainy appearance of the images. Nonaneurysmal aorta. Normal heart size. No pericardial effusion. Mediastinum/Nodes: Midline trachea. No thyroid mass. No significantly enlarged lymph nodes. Esophagus within normal limits Lungs/Pleura: Multiple tiny pulmonary nodules bilaterally. The largest on the right measures 4 mm and is seen in the right upper lobe. The largest on the left is seen in the left upper lobe and measures 7 mm. Scattered hazy densities may reflect diffuse atelectasis or minimal edema. No pleural effusion. Upper Abdomen: No acute abnormality. Vague partially visualized hypodensity in the upper pole of the right kidney Musculoskeletal: Degenerative changes. No acute or suspicious bone lesion Review of the MIP images confirms the above findings. IMPRESSION: 1. Nondiagnostic study for evaluation of pulmonary emboli. This is due to a combination of poor contrast bolus and patient's large habitus which results in overall grainy appearance of the images. 2. Multiple bilateral pulmonary nodules. Non-contrast chest CT at 3-6 months is recommended. If the nodules are stable at time of repeat CT, then future CT at 18-24 months (from today's scan) is considered optional for low-risk patients, but is recommended for high-risk patients. This recommendation follows the consensus statement: Guidelines for Management of Incidental Pulmonary Nodules Detected on CT Images: From the Fleischner Society 2017; Radiology 2017; 284:228-243. 3. Questionable vague hypodensity in the upper pole of right kidney, uncertain if this is a false finding due to the timing of contrast bolus or  if this represents a mass. Nonemergent dedicated renal CT or MRI could be considered for further evaluation. Electronically Signed   By: Donavan Foil M.D.   On: 05/29/2017 21:56     2210:  Pt given short neb; states she feels "much better after that." Pt ambulated with O2 Sats remaining 95-96% R/A. CT-A with  multiple bilateral pulmonary nodules and non-diagnostic for PE. No acute CHF and pt is low risk Well's score; doubt CHF, PE. Doubt ACS with unchanged EKG and normal troponin after several months of vague/atypical symptoms. Pt does have hx asthma/reactive airway disease; will tx prednisone and albuterol MDI.  No clear indication for admission at this time and pt would like to go home. States she already has PMD f/u in 2 days. Dx and testing d/w pt.  Questions answered.  Verb understanding, agreeable to d/c home with outpt f/u.    Final Clinical Impressions(s) / ED Diagnoses   Final diagnoses:  None    ED Discharge Orders    None       Francine Graven, DO 06/02/17 2250

## 2017-05-29 NOTE — Progress Notes (Signed)
   [name]     MRN: [id number]      DOB: [birth year]   HPI [name] is here  reactions to current medications since the last visit.  C/o SOB , generalized swelling, so out of breath today called in to be seen, states that this past weekend , symptoms ere so severe as far as inability to walk a short distance that she nearly went to the ED for assesment, she is scared  Neck and left upper extremity pain wih reduced ROM left upper arm Depressed and tearful about her weight despite intent and effort to lose weight   ROS Denies recent fever or chills. Denies sinus pressure, nasal congestion, ear pain or sore throat. Denies chest congestion, productive cough or wheezing. Denies dysuria, frequency, hesitancy or incontinence Denies headaches, seizures, numbness, or tingling. Denies depression, anxiety or insomnia. Denies skin break down or rash.   PE  Pulse 89   Resp 16   Ht 5\' 7"  (1.702 m)   Wt (!) 380 lb (172.4 kg)   SpO2 95%   BMI 59.52 kg/m   Patient alert and oriented and in cardiopulmonary distress.Anxious  HEENT: No facial asymmetry, EOMI,   oropharynx pink and moist.  Neck supple no JVD, no mass.  Chest: Clear to auscultation bilaterally.Decreased air entry   CVS: S1, S2 no murmurs, no S3.Regular rate.  ABD: Soft non tender.   Ext: 2 plus edema  MS: Decreased  ROM spine, shoulders, hips and knees.  Skin: Intact, no ulcerations or rash noted.  Psych: Good eye contact, normal affect. Memory intact both anxious and depressed appearing.  CNS: CN 2-12 intact, power,  normal throughout.no focal deficits noted.   Assessment & Plan  Exertional dyspnea THREE DAY H/O NOTICEABLE INCREASED EXERTIONAL DYSPNEA , ASSOCIATED WITH LEG AND GENERALIZED SWELLING AND INTERMITTENT CHEST PAIN, MULTIPLE CV RISK FACTORS , ALSO NEED TO EVAL FOR PE, REFERRED TO ED AND I SPOKE WITH RECEIVING  MD  HTN, goal below 130/80 Controlled, no change in medication DASH diet and  commitment to daily physical activity for a minimum of 30 minutes discussed and encouraged, as a part of hypertension management. The importance of attaining a healthy weight is also discussed.  BP/Weight 06/01/2017 05/29/2017 05/29/2017 02/13/2017 10/05/2016 07/22/2016 24/08/3534  Systolic BP 144 [phone] 326  Diastolic BP 80 77 84 80 80 93 82  Wt. (Lbs) 379 380 380 372 370 363 369  BMI 59.36 59.52 59.52 58.26 57.95 57.71 57.79       Morbid obesity Deteriorated. Patient re-educated about  the importance of commitment to a  minimum of 150 minutes of exercise per week.  The importance of healthy food choices with portion control discussed. Encouraged to start a food diary, count calories and to consider  joining a support group. Sample diet sheets offered. Goals set by the patient for the next several months.   Weight /BMI 06/01/2017 05/29/2017 05/29/2017  WEIGHT 379 lb 380 lb 380 lb  HEIGHT 5\' 7"  5\' 7"  5\' 7"   BMI 59.36 kg/m2 59.52 kg/m2 59.52 kg/m2

## 2017-06-01 ENCOUNTER — Ambulatory Visit (INDEPENDENT_AMBULATORY_CARE_PROVIDER_SITE_OTHER): Payer: Managed Care, Other (non HMO) | Admitting: Family Medicine

## 2017-06-01 ENCOUNTER — Encounter: Payer: Self-pay | Admitting: Family Medicine

## 2017-06-01 VITALS — BP 130/80 | HR 82 | Resp 16 | Ht 67.0 in | Wt 379.0 lb

## 2017-06-01 DIAGNOSIS — I10 Essential (primary) hypertension: Secondary | ICD-10-CM

## 2017-06-01 DIAGNOSIS — R600 Localized edema: Secondary | ICD-10-CM

## 2017-06-01 DIAGNOSIS — R0609 Other forms of dyspnea: Secondary | ICD-10-CM

## 2017-06-01 DIAGNOSIS — R072 Precordial pain: Secondary | ICD-10-CM | POA: Diagnosis not present

## 2017-06-01 DIAGNOSIS — Z6841 Body Mass Index (BMI) 40.0 and over, adult: Secondary | ICD-10-CM | POA: Diagnosis not present

## 2017-06-01 DIAGNOSIS — F321 Major depressive disorder, single episode, moderate: Secondary | ICD-10-CM

## 2017-06-01 DIAGNOSIS — R918 Other nonspecific abnormal finding of lung field: Secondary | ICD-10-CM | POA: Diagnosis not present

## 2017-06-01 DIAGNOSIS — M542 Cervicalgia: Secondary | ICD-10-CM | POA: Diagnosis not present

## 2017-06-01 MED ORDER — CYCLOBENZAPRINE HCL 10 MG PO TABS: 10.0000 mg | ORAL_TABLET | Freq: Every day | ORAL | 0 refills | Status: DC

## 2017-06-01 NOTE — Patient Instructions (Addendum)
F/u 2nd week in January, call if you  Need me before  Return to work in am with no restrictions  Flexeril sent to your pharmacy for bedtime use   You are being referred to your lung Specialist re breathing and abnormal lung scan, to heart Doc re poor exercise tolerance for echocardiogram, and also for scan of kidney due to abnormality seen  Eat regularly and often, but eat ONLY what you kNOW that you should  also water  Thank you  for choosing [facility]. We consider it a privelige to serve you.  Delivering excellent health care in a caring and  compassionate way is our goal.  Partnering with you,  so that together we can achieve this goal is our strategy.

## 2017-06-05 DIAGNOSIS — R079 Chest pain, unspecified: Secondary | ICD-10-CM | POA: Insufficient documentation

## 2017-06-05 DIAGNOSIS — R918 Other nonspecific abnormal finding of lung field: Secondary | ICD-10-CM | POA: Insufficient documentation

## 2017-06-05 NOTE — Assessment & Plan Note (Signed)
Deteriorated. Patient re-educated about  the importance of commitment to a  minimum of 150 minutes of exercise per week.  The importance of healthy food choices with portion control discussed. Encouraged to start a food diary, count calories and to consider  joining a support group. Sample diet sheets offered. Goals set by the patient for the next several months.   Weight /BMI 06/01/2017 05/29/2017 05/29/2017  WEIGHT 379 lb 380 lb 380 lb  HEIGHT 5\' 7"  5\' 7"  5\' 7"   BMI 59.36 kg/m2 59.52 kg/m2 59.52 kg/m2

## 2017-06-05 NOTE — Assessment & Plan Note (Signed)
New c/o intermittent chest pain with leg swelling and exertional fatigue, refer for card eval

## 2017-06-05 NOTE — Assessment & Plan Note (Addendum)
THREE DAY H/O NOTICEABLE INCREASED EXERTIONAL DYSPNEA , ASSOCIATED WITH LEG AND GENERALIZED SWELLING AND INTERMITTENT CHEST PAIN, MULTIPLE CV RISK FACTORS , ALSO NEED TO EVAL FOR PE, REFERRED TO ED AND I SPOKE WITH RECEIVING  MD

## 2017-06-05 NOTE — Assessment & Plan Note (Signed)
abnormal scan and increase exertional fatigue, pulmonary eval needed

## 2017-06-05 NOTE — Progress Notes (Signed)
[name]     MRN: [id number]      DOB: Nov 07, 1965   HPI [name] is here for follow up  Recent ED visit and review of and re-evaluation of chronic medical conditions, medication management and review of any available recent lab and radiology data.  States she was given a breathing treatment in the ED which was very beneficial, now requests nebulizer at home Requests phentermine for weight loss, will need card eval prior to this C/o left shoulder  pain with neck spasm and stiffness for past several months worsening ROS Denies recent fever or chills. Denies sinus pressure, nasal congestion, ear pain or sore throat.  Denies chest pains, palpitations and leg swelling Denies abdominal pain, nausea, vomiting,diarrhea or constipation.   Denies dysuria, frequency, hesitancy or incontinence. Denies headaches, seizures, numbness, or tingling. C/o  Depression over inability to lose weight and  anxiety denies insomnia. Denies skin break down or rash.   PE  BP 130/80   Pulse 82   Resp 16   Ht 5\' 7"  (1.702 m)   Wt (!) 379 lb (171.9 kg)   SpO2 93%   BMI 59.36 kg/m   Patient alert and oriented and in no cardiopulmonary distress.  HEENT: No facial asymmetry, EOMI,   oropharynx pink and moist.  Neck supple no JVD, no mass.  Chest: Clear to auscultation bilaterally.  CVS: S1, S2 no murmurs, no S3.Regular rate.  ABD: Soft non tender.   Ext: No edema  MS: Adequate ROM spine,  hips and knees.Decreased rOM left shoulder  Skin: Intact, no ulcerations or rash noted.  Psych: Good eye contact, normal affect. Memory intact not anxious or depressed appearing.  CNS: CN 2-12 intact, power,  normal throughout.no focal deficits noted.   Assessment & Plan  Multiple pulmonary nodules abnormal scan and increase exertional fatigue, pulmonary eval needed  Chest pain New c/o intermittent chest pain with leg swelling and exertional fatigue, refer for card eval  Neck pain on left  side Increased left neck and shoulder pain and spasm, flexeril at bedtime  Exertional dyspnea Dyspnea on exertion in morbidly obese with multipe cardiaxc rik factors, needs cardiology evaluation  BMI 50.0-59.9, adult ([place]) Deteriorated. Patient re-educated about  the importance of commitment to a  minimum of 150 minutes of exercise per week.  The importance of healthy food choices with portion control discussed. Encouraged to start a food diary, count calories and to consider  joining a support group. Sample diet sheets offered. Goals set by the patient for the next several months.   Weight /BMI 06/01/2017 05/29/2017 05/29/2017  WEIGHT 379 lb 380 lb 380 lb  HEIGHT 5\' 7"  5\' 7"  5\' 7"   BMI 59.36 kg/m2 59.52 kg/m2 59.52 kg/m2  Hold on phentermine until cardiology has evaluated    Depression, major, single episode, moderate (HCC) Inadequate control with inability to control eating and weight gain. Not suicidal or homicidal, intolerant of increased  Dose of fluoxetine, would benefit from psychiatry but no interest currently  HTN, goal below 130/80 Not at goal,  no change in medication DASH diet and commitment to daily physical activity for a minimum of 30 minutes discussed and encouraged, as a part of hypertension management. The importance of attaining a healthy weight is also discussed.  BP/Weight 06/01/2017 05/29/2017 05/29/2017 02/13/2017 10/05/2016 07/22/2016 29/11/1882  Systolic BP 166 063 016 010 932 355 732  Diastolic BP 80 77 84 80 80 93 82  Wt. (Lbs) 379 380 380 372 370 363  369  BMI 59.36 59.52 59.52 58.26 57.95 57.71 57.79

## 2017-06-05 NOTE — Assessment & Plan Note (Signed)
Controlled, no change in medication DASH diet and commitment to daily physical activity for a minimum of 30 minutes discussed and encouraged, as a part of hypertension management. The importance of attaining a healthy weight is also discussed.  BP/Weight 06/01/2017 05/29/2017 05/29/2017 02/13/2017 10/05/2016 07/22/2016 78/03/7846  Systolic BP 841 282 081 388 719 597 471  Diastolic BP 80 77 84 80 80 93 82  Wt. (Lbs) 379 380 380 372 370 363 369  BMI 59.36 59.52 59.52 58.26 57.95 57.71 57.79

## 2017-06-18 ENCOUNTER — Encounter: Payer: Self-pay | Admitting: Family Medicine

## 2017-06-18 NOTE — Assessment & Plan Note (Signed)
Increased left neck and shoulder pain and spasm, flexeril at bedtime

## 2017-06-18 NOTE — Assessment & Plan Note (Signed)
Inadequate control with inability to control eating and weight gain. Not suicidal or homicidal, intolerant of increased  Dose of fluoxetine, would benefit from psychiatry but no interest currently

## 2017-06-18 NOTE — Assessment & Plan Note (Signed)
Not at goal,  no change in medication DASH diet and commitment to daily physical activity for a minimum of 30 minutes discussed and encouraged, as a part of hypertension management. The importance of attaining a healthy weight is also discussed.  BP/Weight 06/01/2017 05/29/2017 05/29/2017 02/13/2017 10/05/2016 07/22/2016 53/08/231  Systolic BP 435 686 168 372 902 111 552  Diastolic BP 80 77 84 80 80 93 82  Wt. (Lbs) 379 380 380 372 370 363 369  BMI 59.36 59.52 59.52 58.26 57.95 57.71 57.79

## 2017-06-18 NOTE — Assessment & Plan Note (Signed)
Dyspnea on exertion in morbidly obese with multipe cardiaxc rik factors, needs cardiology evaluation

## 2017-06-18 NOTE — Assessment & Plan Note (Signed)
Deteriorated. Patient re-educated about  the importance of commitment to a  minimum of 150 minutes of exercise per week.  The importance of healthy food choices with portion control discussed. Encouraged to start a food diary, count calories and to consider  joining a support group. Sample diet sheets offered. Goals set by the patient for the next several months.   Weight /BMI 06/01/2017 05/29/2017 05/29/2017  WEIGHT 379 lb 380 lb 380 lb  HEIGHT 5\' 7"  5\' 7"  5\' 7"   BMI 59.36 kg/m2 59.52 kg/m2 59.52 kg/m2  Hold on phentermine until cardiology has evaluated

## 2017-06-19 ENCOUNTER — Ambulatory Visit (HOSPITAL_COMMUNITY): Payer: Self-pay | Admitting: Psychiatry

## 2017-06-20 ENCOUNTER — Ambulatory Visit: Payer: Managed Care, Other (non HMO) | Admitting: Adult Health

## 2017-06-20 ENCOUNTER — Encounter: Payer: Self-pay | Admitting: Adult Health

## 2017-06-20 VITALS — BP 112/78 | HR 76 | Ht 66.0 in | Wt 373.0 lb

## 2017-06-20 DIAGNOSIS — J4520 Mild intermittent asthma, uncomplicated: Secondary | ICD-10-CM

## 2017-06-20 DIAGNOSIS — J209 Acute bronchitis, unspecified: Secondary | ICD-10-CM | POA: Diagnosis not present

## 2017-06-20 DIAGNOSIS — R918 Other nonspecific abnormal finding of lung field: Secondary | ICD-10-CM | POA: Diagnosis not present

## 2017-06-20 DIAGNOSIS — J449 Chronic obstructive pulmonary disease, unspecified: Secondary | ICD-10-CM | POA: Diagnosis not present

## 2017-06-20 DIAGNOSIS — R0609 Other forms of dyspnea: Secondary | ICD-10-CM

## 2017-06-20 MED ORDER — BUDESONIDE-FORMOTEROL FUMARATE 80-4.5 MCG/ACT IN AERO: 2.0000 | INHALATION_SPRAY | Freq: Two times a day (BID) | RESPIRATORY_TRACT | 5 refills | Status: DC

## 2017-06-20 MED ORDER — BUDESONIDE-FORMOTEROL FUMARATE 80-4.5 MCG/ACT IN AERO: 2.0000 | INHALATION_SPRAY | Freq: Two times a day (BID) | RESPIRATORY_TRACT | 0 refills | Status: DC

## 2017-06-20 MED ORDER — LEVALBUTEROL HCL 0.63 MG/3ML IN NEBU
0.6300 mg | INHALATION_SOLUTION | Freq: Once | RESPIRATORY_TRACT | Status: AC
  Administered 2017-06-20: 0.63 mg via RESPIRATORY_TRACT

## 2017-06-20 NOTE — Assessment & Plan Note (Signed)
[facility] Patient Instructions  Zpack take as directed.  Mucinex DM Twice daily  As needed  Cough/congestion .  Restart Symbicort 2 puffs Twice daily  , rinse after use.  ProAir 2 puffs every 4hrs as needed for wheezing /shortness of breath-this is your rescue inhaler.  Follow up with Dr. Halford Chessman  In 2 months and As needed   Please contact office for sooner follow up if symptoms do not improve or worsen or seek emergency care  CT chest - High Resolution  In 2 months follow lung nodules .

## 2017-06-20 NOTE — Progress Notes (Signed)
I have reviewed and agree with assessment/plan.  Chesley Mires, MD [facility] Pulmonary/Critical Care 06/20/2017, 12:38 PM Pager:  [phone]

## 2017-06-20 NOTE — Patient Instructions (Addendum)
Zpack take as directed.  Mucinex DM Twice daily  As needed  Cough/congestion .  Restart Symbicort 2 puffs Twice daily  , rinse after use.  ProAir 2 puffs every 4hrs as needed for wheezing /shortness of breath-this is your rescue inhaler.  Follow up with Dr. Halford Chessman  In 2 months and As needed   Please contact office for sooner follow up if symptoms do not improve or worsen or seek emergency care  CT chest - High Resolution  In 2 months follow lung nodules .

## 2017-06-20 NOTE — Progress Notes (Signed)
@Patient  ID: [name], female    DOB: October 13, 1965, 51 y.o.   MRN: [id number]  Chief Complaint  Patient presents with  . Acute Visit    Dyspnea     Referring provider: Fayrene Helper, MD  HPI: 51 yo former smoker (smoked 30 yr 1.5ppd) seen for pulmonary consult August 2016 for dyspnea.  TESTS: Echo 01/19/15 >> EF 60 to 65%, normal PA pressures PFT 03/10/15 >> FEV1 2.09 (78%), FEV1% 90, TLC 4.19 (75%), DLCO 81%, no BD   06/20/2017 Follow-up dyspnea Patient presents for a follow-up visit for shortness of breath over the last 5 months.  She was last seen in the office in 2016.  Patient said that her breathing had gotten better since her last visit but over the last 5 months has noticed that her shortness of breath has been getting worse.  She gets winded when she tries to walk any amount of distance.  She also has associated fatigue.  She was recently seen in the emergency room.  A CT chest was nondiagnostic for PE.  It did show multiple bilateral pulmonary nodules .  She denies chest pain.  She has had some intermittent dry cough and wheezing.  Over the last week she has had increased cough congestion with thick yellow mucus sinus drainage and wheezing.  Patient was started on Symbicort in the last few months but has not been taking on a regular basis.  She denies any orthopnea PND increased leg swelling.  She has been referred to cardiology has an upcoming appointment next week for consultation.   Patient drives a bus.  She denies any known history of rheumatoid arthritis or lupus.  No previous exposure to Macrodantin amiodarone methotrexate.  Previous PFT in August 2016 showed mild restriction with no airflow obstruction.  DLCO 81%. She was a heavy smoker . Quit 3 yrs ago.  Patient was unable to do exhaled nitric oxide testing today.  Spirometry showed moderate restriction with an FVC at 64% FEV1 70% ratio 87%.  Patient's weight has trended up 20 pounds and currently at 373 pounds  since last visit.  Patient does not exercise on a regular basis    Allergies  Allergen Reactions  . Wellbutrin [Bupropion] Shortness Of Breath  . Ibuprofen Hives  . Statins Other (See Comments)    Muscle cramps    Immunization History  Administered Date(s) Administered  . Influenza Split 05/08/2014  . Influenza Whole 05/29/2016  . Influenza,inj,Quad PF,6+ Mos 07/22/2013, 06/23/2015, 05/29/2017  . Pneumococcal Polysaccharide-23 07/22/2013  . Td 03/10/2008    Past Medical History:  Diagnosis Date  . Allergy   . Arthritis    knees  . Chronic headaches   . Depression   . Diabetes ([place])   . GERD (gastroesophageal reflux disease)   . Herpes zoster complicated 51/8242   affecting right eye with blured vision  . Hypertension   . Nicotine addiction   . Obesity   . Sinusitis     Tobacco History: Social History   Tobacco Use  Smoking Status Former Smoker  . Packs/day: 1.50  . Years: 30.00  . Pack years: 45.00  . Types: Cigarettes  . Last attempt to quit: 03/05/2014  . Years since quitting: 3.2  Smokeless Tobacco Never Used   Counseling given: Not Answered   Outpatient Encounter Medications as of 06/20/2017  Medication Sig  . acetaminophen (TYLENOL) 325 MG tablet Take 650 mg daily as needed by mouth for mild pain.   . budesonide-formoterol (  SYMBICORT) 80-4.5 MCG/ACT inhaler Inhale 2 puffs into the lungs 2 (two) times daily. (Patient taking differently: Inhale 2 puffs daily as needed into the lungs (for shortness of breath). )  . Calcium Carbonate-Vitamin D (CALCIUM + D PO) Take 1 tablet by mouth daily.   . cyclobenzaprine (FLEXERIL) 10 MG tablet Take 1 tablet (10 mg total) at bedtime by mouth.  Marland Kitchen FLUoxetine (PROZAC) 10 MG capsule Take 10 mg daily by mouth.  . fluticasone (FLONASE) 50 MCG/ACT nasal spray USE TWO SPRAY(S) IN EACH NOSTRIL ONCE DAILY (Patient taking differently: Place 2 sprays daily into both nostrils. USE TWO SPRAY(S) IN EACH NOSTRIL ONCE DAILY)  .  furosemide (LASIX) 40 MG tablet Take 1 tablet (40 mg total) by mouth daily. (Patient taking differently: Take 80 mg every other day by mouth. )  . KLOR-CON M20 20 MEQ tablet TAKE 1 TABLET BY MOUTH ONCE DAILY AS NEEDED WITH FLUID PILL  . loratadine (CLARITIN) 10 MG tablet Take 1 tablet (10 mg total) by mouth daily. (Patient taking differently: Take 10 mg by mouth as needed. )  . losartan (COZAAR) 100 MG tablet Take 1 tablet (100 mg total) by mouth daily.  . meloxicam (MOBIC) 15 MG tablet Take 15 mg by mouth daily.  . metFORMIN (GLUCOPHAGE) 500 MG tablet TAKE 1 TABLET BY MOUTH TWICE DAILY WITH MEALS (Patient taking differently: TAKE 1 TABLET BY MOUTH DAILY WITH MEALS)  . PROAIR HFA 108 (90 Base) MCG/ACT inhaler INHALE TWO PUFFS INTO LUNGS EVERY 6 HOURS AS NEEDED FOR WHEEZING OR  SHORTNESS  OF  BREATH  . [DISCONTINUED] predniSONE (DELTASONE) 20 MG tablet Take 2 tablets (40 mg total) daily by mouth. (Patient not taking: Reported on 06/20/2017)   No facility-administered encounter medications on file as of 06/20/2017.      Review of Systems  Constitutional:   No  weight loss, night sweats,  Fevers, chills,  +fatigue, or  lassitude.  HEENT:   No headaches,  Difficulty swallowing,  Tooth/dental problems, or  Sore throat,                No sneezing, itching, ear ache, nasal congestion, post nasal drip,   CV:  No chest pain,  Orthopnea, PND, swelling in lower extremities, anasarca, dizziness, palpitations, syncope.   GI  No heartburn, indigestion, abdominal pain, nausea, vomiting, diarrhea, change in bowel habits, loss of appetite, bloody stools.   Resp   No chest wall deformity  Skin: no rash or lesions.  GU: no dysuria, change in color of urine, no urgency or frequency.  No flank pain, no hematuria   MS:  No joint pain or swelling.  No decreased range of motion.  No back pain.    Physical Exam  BP 112/78 (BP Location: Left Arm, Cuff Size: Large)   Pulse 76   Ht 5\' 6"  (1.676 m)   Wt  (!) 373 lb (169.2 kg)   SpO2 97%   BMI 60.20 kg/m   GEN: A/Ox3; pleasant , NAD, morbidly obese    HEENT:  [place]/AT,  EACs-clear, TMs-wnl, NOSE-clear, THROAT-clear, no lesions, no postnasal drip or exudate noted.   NECK:  Supple w/ fair ROM; no JVD; normal carotid impulses w/o bruits; no thyromegaly or nodules palpated; no lymphadenopathy.    RESP  Clear  P & A; w/o, wheezes/ rales/ or rhonchi. no accessory muscle use, no dullness to percussion  CARD:  RRR, no m/r/g, tr peripheral edema, pulses intact, no cyanosis or clubbing.  GI:   Soft &  nt; nml bowel sounds; no organomegaly or masses detected.   Musco: Warm bil, no deformities or joint swelling noted.   Neuro: alert, no focal deficits noted.    Skin: Warm, no lesions or rashes    Lab Results:  CBC    Component Value Date/Time   WBC 6.1 05/29/2017 1928   RBC 5.36 (H) 05/29/2017 1928   HGB 11.8 (L) 05/29/2017 1928   HCT 38.8 05/29/2017 1928   PLT 242 05/29/2017 1928   MCV 72.4 (L) 05/29/2017 1928   MCH 22.0 (L) 05/29/2017 1928   MCHC 30.4 05/29/2017 1928   RDW 15.7 (H) 05/29/2017 1928   LYMPHSABS 2.5 05/29/2017 1928   MONOABS 0.5 05/29/2017 1928   EOSABS 0.2 05/29/2017 1928   BASOSABS 0.1 05/29/2017 1928    BMET    Component Value Date/Time   NA 143 05/29/2017 1928   K 3.8 05/29/2017 1928   CL 98 (L) 05/29/2017 1928   CO2 28 05/29/2017 1928   GLUCOSE 142 (H) 05/29/2017 1928   BUN 10 05/29/2017 1928   CREATININE 0.64 05/29/2017 1928   CREATININE 0.79 05/24/2017 0852   CALCIUM 10.0 05/29/2017 1928   GFRNONAA >60 05/29/2017 1928   GFRNONAA 87 05/24/2017 0852   GFRAA >60 05/29/2017 1928   GFRAA 100 05/24/2017 0852    BNP    Component Value Date/Time   BNP 15.0 05/29/2017 1902    ProBNP No results found for: PROBNP  Imaging: Dg Chest 2 View  Result Date: 05/29/2017 CLINICAL DATA:  Shortness of breath and heaviness in chest EXAM: CHEST  2 VIEW COMPARISON:  December 31, 2014 FINDINGS: There is a 7 mm  nodular opacity either in or overlying the left lower lobe. Lungs elsewhere clear. Heart is upper normal in size with mild pulmonary venous hypertension. No adenopathy. There is degenerative change in the thoracic spine. IMPRESSION: 1. 7 mm nodular opacity in or overlying the left lower lobe, not present previously. This finding warrants noncontrast enhanced chest CT to further assess. Pulmonary vascular congestion without edema or consolidation. Electronically Signed   By: Lowella Grip III M.D.   On: 05/29/2017 15:14   Ct Angio Chest Pe W/cm &/or Wo Cm  Result Date: 05/29/2017 CLINICAL DATA:  Shortness of breath EXAM: CT ANGIOGRAPHY CHEST WITH CONTRAST TECHNIQUE: Multidetector CT imaging of the chest was performed using the standard protocol during bolus administration of intravenous contrast. Multiplanar CT image reconstructions and MIPs were obtained to evaluate the vascular anatomy. CONTRAST:  100 mL Isovue 370 intravenous COMPARISON:  05/29/2017 FINDINGS: Cardiovascular: Very poor contrast bolus with very Greeson contrast in the pulmonary arterial system. Large body habitus results in overall grainy appearance of the images. Nonaneurysmal aorta. Normal heart size. No pericardial effusion. Mediastinum/Nodes: Midline trachea. No thyroid mass. No significantly enlarged lymph nodes. Esophagus within normal limits Lungs/Pleura: Multiple tiny pulmonary nodules bilaterally. The largest on the right measures 4 mm and is seen in the right upper lobe. The largest on the left is seen in the left upper lobe and measures 7 mm. Scattered hazy densities may reflect diffuse atelectasis or minimal edema. No pleural effusion. Upper Abdomen: No acute abnormality. Vague partially visualized hypodensity in the upper pole of the right kidney Musculoskeletal: Degenerative changes. No acute or suspicious bone lesion Review of the MIP images confirms the above findings. IMPRESSION: 1. Nondiagnostic study for evaluation of  pulmonary emboli. This is due to a combination of poor contrast bolus and patient's large habitus which results in overall grainy appearance  of the images. 2. Multiple bilateral pulmonary nodules. Non-contrast chest CT at 3-6 months is recommended. If the nodules are stable at time of repeat CT, then future CT at 18-24 months (from today's scan) is considered optional for low-risk patients, but is recommended for high-risk patients. This recommendation follows the consensus statement: Guidelines for Management of Incidental Pulmonary Nodules Detected on CT Images: From the Fleischner Society 2017; Radiology 2017; 284:228-243. 3. Questionable vague hypodensity in the upper pole of right kidney, uncertain if this is a false finding due to the timing of contrast bolus or if this represents a mass. Nonemergent dedicated renal CT or MRI could be considered for further evaluation. Electronically Signed   By: Donavan Foil M.D.   On: 05/29/2017 21:56     Assessment & Plan:   Mild intermittent reactive airway disease with wheezing without complication ?Asthma component   Will try Symbicort on consistent basis  Keep follow up with Cards  Plan  Patient Instructions  Zpack take as directed.  Mucinex DM Twice daily  As needed  Cough/congestion .  Restart Symbicort 2 puffs Twice daily  , rinse after use.  ProAir 2 puffs every 4hrs as needed for wheezing /shortness of breath-this is your rescue inhaler.  Follow up with Dr. Halford Chessman  In 2 months and As needed   Please contact office for sooner follow up if symptoms do not improve or worsen or seek emergency care  CT chest - High Resolution  In 2 months follow lung nodules .        Exertional dyspnea ? Etiology . Suspect is multifactoral with morbid obesity, deconditioning , possible asthma .  She does have restrictive lung disease on spirometry most likely from obesity .  Will check HRCT chest to r/o ILD in 2 month and also to follow for lung nodules.  Add  Symbicort Twice daily  - regular basis   Plan  Patient Instructions  Zpack take as directed.  Mucinex DM Twice daily  As needed  Cough/congestion .  Restart Symbicort 2 puffs Twice daily  , rinse after use.  ProAir 2 puffs every 4hrs as needed for wheezing /shortness of breath-this is your rescue inhaler.  Follow up with Dr. Halford Chessman  In 2 months and As needed   Please contact office for sooner follow up if symptoms do not improve or worsen or seek emergency care  CT chest - High Resolution  In 2 months follow lung nodules .        Multiple pulmonary nodules Repeat CT chest in 3 months . /Feb 2019   Acute bronchitis [facility] Patient Instructions  Zpack take as directed.  Mucinex DM Twice daily  As needed  Cough/congestion .  Restart Symbicort 2 puffs Twice daily  , rinse after use.  ProAir 2 puffs every 4hrs as needed for wheezing /shortness of breath-this is your rescue inhaler.  Follow up with Dr. Halford Chessman  In 2 months and As needed   Please contact office for sooner follow up if symptoms do not improve or worsen or seek emergency care  CT chest - High Resolution  In 2 months follow lung nodules .           Rexene Edison, NP 06/20/2017

## 2017-06-20 NOTE — Addendum Note (Signed)
Addended by: Parke Poisson E on: 06/20/2017 12:52 PM   Modules accepted: Orders

## 2017-06-20 NOTE — Assessment & Plan Note (Signed)
Repeat CT chest in 3 months . /Feb 2019

## 2017-06-20 NOTE — Assessment & Plan Note (Signed)
?  Asthma component   Will try Symbicort on consistent basis  Keep follow up with Cards  Plan  Patient Instructions  Zpack take as directed.  Mucinex DM Twice daily  As needed  Cough/congestion .  Restart Symbicort 2 puffs Twice daily  , rinse after use.  ProAir 2 puffs every 4hrs as needed for wheezing /shortness of breath-this is your rescue inhaler.  Follow up with Dr. Halford Chessman  In 2 months and As needed   Please contact office for sooner follow up if symptoms do not improve or worsen or seek emergency care  CT chest - High Resolution  In 2 months follow lung nodules .

## 2017-06-20 NOTE — Assessment & Plan Note (Signed)
?   Etiology . Suspect is multifactoral with morbid obesity, deconditioning , possible asthma .  She does have restrictive lung disease on spirometry most likely from obesity .  Will check HRCT chest to r/o ILD in 2 month and also to follow for lung nodules.  Add Symbicort Twice daily  - regular basis   Plan  Patient Instructions  Zpack take as directed.  Mucinex DM Twice daily  As needed  Cough/congestion .  Restart Symbicort 2 puffs Twice daily  , rinse after use.  ProAir 2 puffs every 4hrs as needed for wheezing /shortness of breath-this is your rescue inhaler.  Follow up with Dr. Halford Chessman  In 2 months and As needed   Please contact office for sooner follow up if symptoms do not improve or worsen or seek emergency care  CT chest - High Resolution  In 2 months follow lung nodules .

## 2017-06-23 ENCOUNTER — Telehealth: Payer: Self-pay | Admitting: Adult Health

## 2017-06-23 MED ORDER — AZITHROMYCIN 250 MG PO TABS: ORAL_TABLET | ORAL | 0 refills | Status: DC

## 2017-06-23 NOTE — Telephone Encounter (Signed)
Spoke with patient. She was seen by TP on 06/20/17. She was advised that a zpak would be sent in. It was not.   Advised patient that I would send medication in to [facility] on [address] for her. She verbalized understanding. Nothing else needed at time of call.

## 2017-06-30 ENCOUNTER — Encounter: Payer: Self-pay | Admitting: Cardiovascular Disease

## 2017-06-30 ENCOUNTER — Ambulatory Visit: Payer: Managed Care, Other (non HMO) | Admitting: Cardiovascular Disease

## 2017-06-30 DIAGNOSIS — R0609 Other forms of dyspnea: Secondary | ICD-10-CM | POA: Diagnosis not present

## 2017-06-30 NOTE — Patient Instructions (Signed)

## 2017-06-30 NOTE — Progress Notes (Signed)
Cardiology Office Note   Date:  06/30/2017   ID:  MERLINE PERKIN, DOB [birth year], MRN [id number]  PCP:  Tamala Julian, MD  Cardiologist:   Mertie Moores, MD   Chief Complaint  Patient presents with  . Shortness of Breath   Problem List 1. Dyspnea 2. Morbid obesity 3. Diabetes mellitus  4. COPD    History of Present Illness: [name] is a 51 y.o. female who presents for  Shortness of breath.  3 - 4 months  In duratin DOE with waking from car. Has COPD.    No chest pain ,  Does have some anxiety. 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    Dec. 7, 2018:  [name] is seen today after a 2 year absence  Has been short of breath for the past 3 months. This dyspnea has improved Has stopped smoking several years ago      Past Medical History:  Diagnosis Date  . Allergy   . Arthritis    knees  . Chronic headaches   . Depression   . Diabetes ([place])   . GERD (gastroesophageal reflux disease)   . Herpes zoster complicated 12/3147   affecting right eye with blured vision  . Hypertension   . Nicotine addiction   . Obesity   . Sinusitis     Past Surgical History:  Procedure Laterality Date  . CHOLECYSTECTOMY  1995   laparoscopic  . COLONOSCOPY WITH PROPOFOL N/A 04/08/2016   Procedure: COLONOSCOPY WITH PROPOFOL;  Surgeon: Mauri Pole, MD;  Location: [facility];  Service: Endoscopy;  Laterality: N/A;  . Quail CYST  2004  . TONSILLECTOMY     child  . TUBAL LIGATION  1994     Current Outpatient Medications  Medication Sig Dispense Refill  . acetaminophen (TYLENOL) 325 MG tablet Take 650 mg daily as needed by mouth for mild pain.     . budesonide-formoterol (SYMBICORT) 80-4.5 MCG/ACT inhaler Inhale 2 puffs into the lungs 2 (two) times daily. 1 Inhaler 0  . cyclobenzaprine (FLEXERIL) 10 MG tablet Take 1 tablet (10 mg total) at bedtime by mouth. 30 tablet  0  . FLUoxetine (PROZAC) 10 MG capsule Take 10 mg daily by mouth.    . fluticasone (FLONASE) 50 MCG/ACT nasal spray USE TWO SPRAY(S) IN EACH NOSTRIL ONCE DAILY 16 g 5  . gabapentin (NEURONTIN) 300 MG capsule Take 300 mg by mouth daily.    Marland Kitchen glipiZIDE (GLUCOTROL) 5 MG tablet Take 5 mg by mouth daily.    . hydrochlorothiazide (HYDRODIURIL) 12.5 MG tablet Take 12.5 mg by mouth daily.     Marland Kitchen loratadine (CLARITIN) 10 MG tablet Take 1 tablet (10 mg total) by mouth daily. 90 tablet 3  . losartan (COZAAR) 100 MG tablet Take 1 tablet (100 mg total) by mouth daily. 90 tablet 3  . meloxicam (MOBIC) 15 MG tablet Take 15 mg by mouth daily.    . metFORMIN (GLUCOPHAGE) 500 MG tablet TAKE 1 TABLET BY MOUTH TWICE DAILY WITH MEALS 60 tablet 1  . PROAIR HFA 108 (90 Base) MCG/ACT inhaler INHALE TWO PUFFS INTO LUNGS EVERY 6 HOURS AS NEEDED FOR WHEEZING OR  SHORTNESS  OF  BREATH 1 each 3   No current facility-administered medications for this visit.     Allergies:   Wellbutrin [bupropion]; Ibuprofen; and Statins    Social History:  The patient  reports that she quit smoking about 3 years ago. Her smoking use included cigarettes. She has a 45.00 pack-year smoking history. she has never used smokeless tobacco. She reports that she does not drink alcohol or use drugs.   Family History:  The patient's family history includes Colon cancer (age of onset: 56) in her maternal grandfather; Diabetes in her father and mother; Heart disease in her sister; Hypertension in her brother, father, mother, and sister; Rheum arthritis in her father and mother; Stroke in her mother; Throat cancer in her maternal uncle.    ROS:  Please see the history of present illness.   Physical Exam: Blood pressure 112/78, pulse 60, height 5' 6.5" (1.689 m), weight (!) 378 lb 1.9 oz (171.5 kg).  GEN:  Morbid obesity ,  Pleasant female  HEENT: Normal NECK: No JVD; No carotid bruits LYMPHATICS: No lymphadenopathy CARDIAC: RRR distant heart  sounds RESPIRATORY:  Clear to auscultation without rales, wheezing or rhonchi  ABDOMEN: morbidly obese  MUSCULOSKELETAL:  No edema; No deformity  SKIN: Warm and dry NEUROLOGIC:  Alert and oriented x 3   EKG:      Recent Labs: 05/29/2017: ALT 14; B Natriuretic Peptide 15.0; BUN 10; Creatinine, Ser 0.64; Hemoglobin 11.8; Platelets 242; Potassium 3.8; Sodium 143    Lipid Panel    Component Value Date/Time   CHOL 188 02/09/2017 0923   TRIG 123 02/09/2017 0923   HDL 66 02/09/2017 0923   CHOLHDL 2.8 02/09/2017 0923   VLDL 25 02/09/2017 0923   LDLCALC 97 02/09/2017 0923      Wt Readings from Last 3 Encounters:  06/30/17 (!) 378 lb 1.9 oz (171.5 kg)  06/20/17 (!) 373 lb (169.2 kg)  06/01/17 (!) 379 lb (171.9 kg)      Other studies Reviewed: Additional studies/ records that were reviewed today include: . Review of the above records demonstrates:    ASSESSMENT AND PLAN:  1. Dyspnea -   [name] presents for further evaluation of her dyspnea with exertion.  She states that her dyspnea has improved over the past month or so.  Given her weight, we really do not have a way to assess her cardiac function.  An echocardiogram is not likely to show much.  I recommended strongly that she work on her diet, exercise, and weight loss program.  This is very similar to what I advised her to years ago.  She is not lost any weight since that time.  We reviewed her diet.  I recommended that she see a nutritionist.  2. Morbid obesity -  I recommended that she see a nutritionist and will really work on weight loss program.  I will see her as needed.   Current medicines are reviewed at length with the patient today.  The patient does not have concerns regarding medicines.  The following changes have been made:  no change  Labs/ tests ordered today include:  No orders of the defined types were placed in this encounter.    Disposition:   FU with me as needed.       Mertie Moores, MD    06/30/2017 11:10 AM    [address], [place], [place]  [address] Phone: [phone]; Fax: [phone]

## 2017-07-31 ENCOUNTER — Ambulatory Visit: Payer: Self-pay | Admitting: Family Medicine

## 2017-08-20 ENCOUNTER — Other Ambulatory Visit: Payer: Self-pay | Admitting: Family Medicine

## 2017-08-21 ENCOUNTER — Ambulatory Visit (INDEPENDENT_AMBULATORY_CARE_PROVIDER_SITE_OTHER)
Admission: RE | Admit: 2017-08-21 | Discharge: 2017-08-21 | Disposition: A | Discharge status: Home / Self Care | Payer: Managed Care, Other (non HMO) | Source: Ambulatory Visit | Attending: Pulmonary Disease | Admitting: Pulmonary Disease

## 2017-08-21 DIAGNOSIS — R918 Other nonspecific abnormal finding of lung field: Secondary | ICD-10-CM

## 2017-08-25 ENCOUNTER — Ambulatory Visit: Payer: Self-pay | Admitting: Pulmonary Disease

## 2017-09-08 ENCOUNTER — Ambulatory Visit: Payer: Self-pay | Admitting: Pulmonary Disease

## 2017-09-11 ENCOUNTER — Ambulatory Visit: Payer: Self-pay | Admitting: Pulmonary Disease

## 2017-09-11 ENCOUNTER — Encounter: Payer: Self-pay | Admitting: Acute Care

## 2017-09-11 ENCOUNTER — Ambulatory Visit: Payer: Managed Care, Other (non HMO) | Admitting: Acute Care

## 2017-09-11 DIAGNOSIS — Z122 Encounter for screening for malignant neoplasm of respiratory organs: Secondary | ICD-10-CM

## 2017-09-11 DIAGNOSIS — Z6841 Body Mass Index (BMI) 40.0 and over, adult: Secondary | ICD-10-CM | POA: Diagnosis not present

## 2017-09-11 DIAGNOSIS — J4520 Mild intermittent asthma, uncomplicated: Secondary | ICD-10-CM | POA: Diagnosis not present

## 2017-09-11 MED ORDER — BUDESONIDE-FORMOTEROL FUMARATE 160-4.5 MCG/ACT IN AERO: 2.0000 | INHALATION_SPRAY | Freq: Two times a day (BID) | RESPIRATORY_TRACT | 0 refills | Status: DC

## 2017-09-11 NOTE — Assessment & Plan Note (Signed)
Weight gain of 20 pounds in the last 6 months Plan: Work on weight loss  Diet and increase activity/ exercise

## 2017-09-11 NOTE — Assessment & Plan Note (Addendum)
Continued dyspnea with exertion despite stated compliance with Symbicort 80 Body mass index is 61.17 kg/m.  Weight trend up 21 pounds in the last 6 months Plan: We will give you a sample of Symbicort 160 Use 2 puffs twice daily. Rinse mouth after use. Schedule PFT's, with follow up with Dr. Halford Chessman or NP after PFT's in 1 month to evaluate effectiveness of increase in Symbicort dose. We will schedule repeat CT in 12 months. Work on weight loss.  Please contact office for sooner follow up if symptoms do not improve or worsen or seek emergency care

## 2017-09-11 NOTE — Patient Instructions (Signed)
It is nice to meet you today. We will give you a sample of Symbicort 160 Use 2 puffs twice daily. Rinse mouth after use. Schedule PFT's, with follow up with Dr. Halford Chessman or NP after PFT's in 1 month. We will schedule repeat CT in 12 months. Work on weight loss.  Please contact office for sooner follow up if symptoms do not improve or worsen or seek emergency care

## 2017-09-11 NOTE — Progress Notes (Signed)
History of Present Illness [name] is a 52 y.o. female former smoker ( Quit 2015 with a 45 pack year history)  with COPD, dyspnea on exertion, wheezing and recurrent bronchitis. She was seen as a consult  by Dr. Halford Chessman 02/2015 for dyspnea.   Patient drives a bus.  She denies any known history of rheumatoid arthritis or lupus.  No previous exposure to Macrodantin,amiodarone, or methotrexate.  Previous PFT in August 2016 showed mild restriction with no airflow obstruction.  DLCO 81%. She was a heavy smoker . Quit 2015  Previous  Spirometry showed moderate restriction with an FVC at 64% FEV1 70% ratio 87%.     09/11/2017 Follow up CT: Pt. Presents for Follow up  CT done to evaluate for ILD due to  continued shortness of breath. She states she has been compliant with her Symbicort twice daily. She states the Symbicort 80 is not enough for her  shortness of breath. She states she is using the [employer] 2-3 times daily. She states she has breakthrough wheezing. She states she has had weight gain of at least 50 pounds in the past 2 years. She does not exercise. She realizes she needs to lose weight. She does not have cough. She states she has sinus issues.She uses Flonase for this. She states she has no nasal secretions at present.    Test Results: 08/21/2017: HRCT No findings to suggest interstitial lung disease. Mild air trapping indicative of mild small airways disease. Multiple tiny pulmonary nodules scattered throughout the lungs bilaterally measuring 5 mm or less in size. These are nonspecific but are statistically likely benign. No follow-up needed if patient is low-risk (and has no known or suspected primary neoplasm). Non-contrast chest CT can be considered in 12 months if patient is high-risk.   05/2017 CT Angio: Nondiagnostic study for evaluation of pulmonary emboli. This is due to a combination of poor contrast bolus and patient's large habitus which results in overall grainy  appearance of the images. Multiple bilateral pulmonary nodules. Non-contrast chest CT at 3-6 months is recommended. If the nodules are stable at time of repeat CT, then future CT at 18-24 months (from today's scan) is considered optional for low-risk patients, but is recommended for high-risk patients.  Echo 01/19/15>>EF 60 to 65%, normal PA pressures PFT 03/10/15>>FEV1 2.09 (78%), FEV1% 90, TLC 4.19 (75%), DLCO 81%, no BD   CBC Latest Ref Rng & Units 05/29/2017 06/23/2015 08/02/2014  WBC 4.0 - 10.5 K/uL 6.1 7.1 5.4  Hemoglobin 12.0 - 15.0 g/dL 11.8(L) 12.4 12.3  Hematocrit 36.0 - 46.0 % 38.8 41.7 40.9  Platelets 150 - 400 K/uL 242 272 267    BMP Latest Ref Rng & Units 05/29/2017 05/24/2017 02/09/2017  Glucose 65 - 99 mg/dL 142(H) 161(H) 111(H)  BUN 6 - 20 mg/dL 10 13 9   Creatinine 0.44 - 1.00 mg/dL 0.64 0.79 0.64  BUN/Creat Ratio 6 - 22 (calc) - NOT APPLICABLE -  Sodium 151 - 145 mmol/L 143 141 140  Potassium 3.5 - 5.1 mmol/L 3.8 4.4 4.5  Chloride 101 - 111 mmol/L 98(L) 103 103  CO2 22 - 32 mmol/L 28 30 21   Calcium 8.9 - 10.3 mg/dL 10.0 9.1 8.8    BNP    Component Value Date/Time   BNP 15.0 05/29/2017 1902    ProBNP No results found for: PROBNP  PFT    Component Value Date/Time   FEV1PRE 2.09 03/10/2015 0833   FEV1POST 2.08 03/10/2015 0833   FVCPRE 2.32  03/10/2015 0833   FVCPOST 2.36 03/10/2015 0833   TLC 4.19 03/10/2015 0833   DLCOUNC 23.51 03/10/2015 0833   PREFEV1FVCRT 90 03/10/2015 0833   PSTFEV1FVCRT 88 03/10/2015 0833    Ct Chest High Resolution  Result Date: 08/21/2017 CLINICAL DATA:  52 year old female with worsening shortness of breath on exertion, wheezing and recurrent bronchitis. Evaluate for interstitial lung disease. EXAM: CT CHEST WITHOUT CONTRAST TECHNIQUE: Multidetector CT imaging of the chest was performed following the standard protocol without intravenous contrast. High resolution imaging of the lungs, as well as inspiratory and expiratory  imaging, was performed. COMPARISON:  Chest CT 05/29/2017. FINDINGS: Cardiovascular: Heart size is normal. There is no significant pericardial fluid, thickening or pericardial calcification. No atherosclerotic calcifications are noted in the thoracic aorta or the coronary arteries. Mediastinum/Nodes: No pathologically enlarged mediastinal or hilar lymph nodes. Please note that accurate exclusion of hilar adenopathy is limited on noncontrast CT scans. Esophagus is unremarkable in appearance. No axillary lymphadenopathy. Lungs/Pleura: High-resolution images demonstrate no significant regions of ground-glass attenuation, subpleural reticulation, parenchymal banding, traction bronchiectasis or honeycombing. Inspiratory and expiratory imaging demonstrates some mild air trapping indicative of small airways disease. No acute consolidative airspace disease. No pleural effusions. A few scattered pulmonary nodules are noted in the lungs bilaterally. Multiple tiny pulmonary nodules are noted throughout the lungs bilaterally all of which measure 5 mm or less in size, with the largest nodule in the periphery of the left lower lobe measuring 5 mm on axial image 81 of series 3. No larger suspicious appearing pulmonary nodules or masses are noted. Upper Abdomen: Unremarkable. Musculoskeletal: There are no aggressive appearing lytic or blastic lesions noted in the visualized portions of the skeleton. IMPRESSION: 1. No findings to suggest interstitial lung disease. 2. Mild air trapping indicative of mild small airways disease. 3. Multiple tiny pulmonary nodules scattered throughout the lungs bilaterally measuring 5 mm or less in size. These are nonspecific but are statistically likely benign. No follow-up needed if patient is low-risk (and has no known or suspected primary neoplasm). Non-contrast chest CT can be considered in 12 months if patient is high-risk. This recommendation follows the consensus statement: Guidelines for  Management of Incidental Pulmonary Nodules Detected on CT Images: From the Fleischner Society 2017; Radiology 2017; 284:228-243. Electronically Signed   By: Vinnie Langton M.D.   On: 08/21/2017 17:08     Past medical hx Past Medical History:  Diagnosis Date  . Allergy   . Arthritis    knees  . Chronic headaches   . Depression   . Diabetes ([place])   . GERD (gastroesophageal reflux disease)   . Herpes zoster complicated 03/6221   affecting right eye with blured vision  . Hypertension   . Nicotine addiction   . Obesity   . Sinusitis      Social History   Tobacco Use  . Smoking status: Former Smoker    Packs/day: 1.50    Years: 30.00    Pack years: 45.00    Types: Cigarettes    Last attempt to quit: 03/05/2014    Years since quitting: 3.5  . Smokeless tobacco: Never Used  Substance Use Topics  . Alcohol use: No    Alcohol/week: 0.0 oz  . Drug use: No    [name] reports that she quit smoking about 3 years ago. Her smoking use included cigarettes. She has a 45.00 pack-year smoking history. she has never used smokeless tobacco. She reports that she does not drink alcohol or use drugs.  Tobacco  Cessation: Former smoker , 45 pack year smoking history quit 2015 Past surgical hx, Family hx, Social hx all reviewed.  Current Outpatient Medications on File Prior to Visit  Medication Sig  . acetaminophen (TYLENOL) 325 MG tablet Take 650 mg daily as needed by mouth for mild pain.   . budesonide-formoterol (SYMBICORT) 80-4.5 MCG/ACT inhaler Inhale 2 puffs into the lungs 2 (two) times daily.  . cyclobenzaprine (FLEXERIL) 10 MG tablet Take 1 tablet (10 mg total) at bedtime by mouth.  Marland Kitchen FLUoxetine (PROZAC) 10 MG capsule Take 10 mg daily by mouth.  Marland Kitchen FLUoxetine (PROZAC) 10 MG tablet TAKE 1 TABLET BY MOUTH ONCE DAILY  . fluticasone (FLONASE) 50 MCG/ACT nasal spray USE TWO SPRAY(S) IN EACH NOSTRIL ONCE DAILY  . gabapentin (NEURONTIN) 300 MG capsule Take 300 mg by mouth daily.  .  hydrochlorothiazide (HYDRODIURIL) 12.5 MG tablet Take 12.5 mg by mouth daily.   Marland Kitchen loratadine (CLARITIN) 10 MG tablet Take 1 tablet (10 mg total) by mouth daily.  Marland Kitchen losartan (COZAAR) 100 MG tablet Take 1 tablet (100 mg total) by mouth daily.  . meloxicam (MOBIC) 15 MG tablet Take 15 mg by mouth daily.  . metFORMIN (GLUCOPHAGE) 500 MG tablet TAKE 1 TABLET BY MOUTH TWICE DAILY WITH MEALS  . PROAIR HFA 108 (90 Base) MCG/ACT inhaler INHALE TWO PUFFS INTO LUNGS EVERY 6 HOURS AS NEEDED FOR WHEEZING OR  SHORTNESS  OF  BREATH   No current facility-administered medications on file prior to visit.      Allergies  Allergen Reactions  . Wellbutrin [Bupropion] Shortness Of Breath  . Ibuprofen Hives  . Statins Other (See Comments)    Muscle cramps    Review Of Systems:  Constitutional:   No  weight loss, night sweats,  Fevers, chills, fatigue, or  lassitude.  HEENT:   No headaches,  Difficulty swallowing,  Tooth/dental problems, or  Sore throat,                No sneezing, itching, ear ache,+ nasal congestion, post nasal drip,   CV:  No chest pain,  Orthopnea, PND, swelling in lower extremities, anasarca, dizziness, palpitations, syncope.   GI  No heartburn, indigestion, abdominal pain, nausea, vomiting, diarrhea, change in bowel habits, loss of appetite, bloody stools.   Resp: + shortness of breath with exertion less at rest.  No excess mucus, no productive cough,  No non-productive cough,  No coughing up of blood.  No change in color of mucus.  No wheezing.  No chest wall deformity  Skin: no rash or lesions.  GU: no dysuria, change in color of urine, no urgency or frequency.  No flank pain, no hematuria   MS:  No joint pain or swelling.  No decreased range of motion.  No back pain.  Psych:  No change in mood or affect. No depression or anxiety.  No memory loss.   Vital Signs BP 124/82 (BP Location: Left Arm, Cuff Size: Normal)   Pulse 94   Ht 5\' 6"  (1.676 m)   Wt (!) 379 lb (171.9 kg)    SpO2 94%   BMI 61.17 kg/m    Physical Exam:  General- No distress,  A&Ox3, pleasant and obese ENT: No sinus tenderness, TM clear, pale nasal mucosa, no oral exudate,+ post nasal drip, no LAN Cardiac: S1, S2, regular rate and rhythm, no murmur Chest: Faint  wheeze/ No rales/ dullness; no accessory muscle use, no nasal flaring, no sternal retractions Abd.: Soft Non-tender, non-distended, Obese Ext:  No clubbing cyanosis, edema Neuro:  normal strength, MAE x 4, A&O x3 Skin: No rashes, warm and dry Psych: normal mood and behavior  Body mass index is 61.17 kg/m.   Weight Trend 08/2017>> 379 lbs 05/2017>> 373 lbs 02/2015>> 358 lbs  Assessment/Plan  Mild intermittent reactive airway disease with wheezing without complication Continued dyspnea with exertion despite stated compliance with Symbicort 80 Body mass index is 61.17 kg/m.  Weight trend up 21 pounds in the last 6 months Plan: We will give you a sample of Symbicort 160 Use 2 puffs twice daily. Rinse mouth after use. Schedule PFT's, with follow up with Dr. Halford Chessman or NP after PFT's in 1 month to evaluate effectiveness of increase in Symbicort dose. We will schedule repeat CT in 12 months. Work on weight loss.  Please contact office for sooner follow up if symptoms do not improve or worsen or seek emergency care      BMI 60.0-69.9, adult ([place]) Weight gain of 20 pounds in the last 6 months Plan: Work on weight loss  Diet and increase activity/ exercise    Magdalen Spatz, NP 09/11/2017  7:40 PM

## 2017-09-15 ENCOUNTER — Telehealth: Payer: Self-pay | Admitting: Pulmonary Disease

## 2017-09-15 NOTE — Telephone Encounter (Signed)
Entered in error

## 2017-10-23 ENCOUNTER — Ambulatory Visit: Payer: Self-pay | Admitting: Acute Care

## 2018-05-18 ENCOUNTER — Ambulatory Visit: Payer: Self-pay | Admitting: Nurse Practitioner

## 2018-05-18 ENCOUNTER — Ambulatory Visit: Payer: Managed Care, Other (non HMO) | Admitting: Pulmonary Disease

## 2018-05-18 ENCOUNTER — Encounter: Payer: Self-pay | Admitting: Pulmonary Disease

## 2018-05-18 VITALS — BP 118/82 | HR 116 | Temp 98.4°F | Ht 67.0 in | Wt 386.8 lb

## 2018-05-18 DIAGNOSIS — R918 Other nonspecific abnormal finding of lung field: Secondary | ICD-10-CM

## 2018-05-18 DIAGNOSIS — Z6841 Body Mass Index (BMI) 40.0 and over, adult: Secondary | ICD-10-CM

## 2018-05-18 DIAGNOSIS — J4520 Mild intermittent asthma, uncomplicated: Secondary | ICD-10-CM

## 2018-05-18 DIAGNOSIS — J302 Other seasonal allergic rhinitis: Secondary | ICD-10-CM | POA: Diagnosis not present

## 2018-05-18 DIAGNOSIS — J309 Allergic rhinitis, unspecified: Secondary | ICD-10-CM

## 2018-05-18 MED ORDER — BUDESONIDE-FORMOTEROL FUMARATE 160-4.5 MCG/ACT IN AERO: 2.0000 | INHALATION_SPRAY | Freq: Two times a day (BID) | RESPIRATORY_TRACT | 0 refills | Status: DC

## 2018-05-18 MED ORDER — ALBUTEROL SULFATE (2.5 MG/3ML) 0.083% IN NEBU: INHALATION_SOLUTION | RESPIRATORY_TRACT | 3 refills | Status: DC

## 2018-05-18 NOTE — Assessment & Plan Note (Signed)
Continue to work towards healthy weight 

## 2018-05-18 NOTE — Assessment & Plan Note (Signed)
Restart Symbicort 160 >>> 2 puffs in the morning right when you wake up, rinse out your mouth after use, 12 hours later 2 puffs, rinse after use >>> Take this daily, no matter what >>> This is not a rescue inhaler   Only use your albuterol as a rescue medication to be used if you can't catch your breath by resting or doing a relaxed purse lip breathing pattern.  - The less you use it, the better it will work when you need it. - Ok to use up to 2 puffs  every 4 hours if you must but call for immediate appointment if use goes up over your usual need - Don't leave home without it !!  (think of it like the spare tire for your car)   We will also prescribe albuterol nebulizers >>>With nebulizer machine >>>Can use nebulizer every 4-6 hours as needed for shortness of breath or wheezing >>>If you are at home and able to use the nebulizer use this instead of your rescue inhaler  Follow-up with primary care >>> Discussed concerns with losartan and see if they will trial you off of this blood pressure medication to see if this helps with your breathing  We will request records from primary care for office notes as well as your cardiopulmonary stress test

## 2018-05-18 NOTE — Assessment & Plan Note (Signed)
Complete follow-up CT in January/2020

## 2018-05-18 NOTE — Patient Instructions (Addendum)
     November/2019 we will be moving! We will no longer be at our [place] location.  Be on the look out for a post card/mailer to let you know we have officially moved.  Our new address and phone number will be:  [address] [place], [place] [address] Telephone number: [phone]  It is flu season:   >>>Remember to be washing your hands regularly, using hand sanitizer, be careful to use around herself with has contact with people who are sick will increase her chances of getting sick yourself. >>> Best ways to protect herself from the flu: Receive the yearly flu vaccine, practice good hand hygiene washing with soap and also using hand sanitizer when available, eat a nutritious meals, get adequate rest, hydrate appropriately   Please contact the office if your symptoms worsen or you have concerns that you are not improving.   Thank you for choosing [facility] for your healthcare, and for allowing [place] to partner with you on your healthcare journey. I am thankful to be able to provide care to you today.   Wyn Quaker FNP-C

## 2018-05-18 NOTE — Assessment & Plan Note (Signed)
  Restart Claritin/loratadine >>>Take 1 tablet daily for allergy symptoms  Can use Flonase 1 spray each nostril as needed daily for nasal congestion or allergy symptoms

## 2018-05-18 NOTE — Progress Notes (Signed)
@Patient  ID: Edison Simon, female    DOB: 02/17/1966, 52 y.o.   MRN: [id number]  Chief Complaint  Patient presents with  . Acute Visit    SOB    Referring provider: Windell Hummingbird, PA-C  HPI:  52 y.o. female former smoker ( Quit 2015 with a 45 pack year history)  with COPD, dyspnea on exertion, wheezing and recurrent bronchitis. She was seen as a consult  by Dr. Halford Chessman 02/2015 for dyspnea.  Patient drives a bus.  PMH: denies any known history of rheumatoid arthritis or lupus.  No previous exposure to Macrodantin,amiodarone, or methotrexate. Smoker/ Smoking History: Former smoker.  Quit 2015.  45-pack-year history. Maintenance:  Symbicort 80 Pt of: Dr. Halford Chessman   Recent [place] [facility] Encounters:   Patient last seen in office in 2018  09/11/2017 Follow up CT: Pt. Presents for Follow up  CT done to evaluate for ILD due to  continued shortness of breath. She states she has been compliant with her Symbicort twice daily. She states the Symbicort 80 is not enough for her  shortness of breath. She states she is using the [employer] 2-3 times daily. She states she has breakthrough wheezing. She states she has had weight gain of at least 50 pounds in the past 2 years. She does not exercise. She realizes she needs to lose weight. She does not have cough. She states she has sinus issues.She uses Flonase for this. She states she has no nasal secretions at present.    Test Results: 08/21/2017: HRCT No findings to suggest interstitial lung disease. Mild air trapping indicative of mild small airways disease. Multiple tiny pulmonary nodules scattered throughout the lungs bilaterally measuring 5 mm or less in size. These are nonspecific but are statistically likely benign. No follow-up needed if patient is low-risk (and has no known or suspected primary neoplasm). Non-contrast chest CT can be considered in 12 months if patient is high-risk.   05/2017 CT Angio: Nondiagnostic study for evaluation of  pulmonary emboli. This is due to a combination of poor contrast bolus and patient's large habitus which results in overall grainy appearance of the images. Multiple bilateral pulmonary nodules. Non-contrast chest CT at 3-6 months is recommended. If the nodules are stable at time of repeat CT, then future CT at 18-24 months (from today's scan) is considered optional for low-risk patients, but is recommended for high-risk patients.  Echo 01/19/15>>EF 60 to 65%, normal PA pressures PFT 03/10/15>>FEV1 2.09 (78%), FEV1% 90, TLC 4.19 (75%), DLCO 81%, no BD  05/18/2018  - Visit   52 year old female former smoker presenting to our office for follow-up as she reports her shortness of breath has worsened since January/2019.  Rescue inhaler 3-4 times a day.  Patient reports that she is fatigued, tired patient was short of breath and worsening.  Patient reports she continues to have "test after test test" but no one can tell her why she struggles to breathe.  Patient was previously given a prescription to Symbicort 160 which she said did help her breathing, she never followed up with this office and patient ended up some restarting Symbicort 80.  Patient reports that she does better when she takes a decongestant every day.  Patient also reporting that she believes much of her symptoms and issues are due to her use of losartan.  Patient reports new primary care provider at [facility].  Patient reports that she recently had a cardiopulmonary stress test that was normal.  We do not  have those records we will request     Tests:  08/21/17-CT chest high-res- no findings of interstitial lung disease, mild air trapping, multiple tiny pulmonary nodules bilaterally 5 mm or less, noncontrast chest CT can be considered in 12 months if patient is high risk 05/29/2017-CT Angio- nondiagnostic study for PE, poor contrast bolus, multiple bilateral pulmonary nodules, noncontrast chest CT in 3 to 6 months is  recommended 06/20/2017-spirometry- FVC 2.0 (64% predicted), ratio 87, FEV1 70%, moderate restriction 03/10/2015-pulmonary function test-FVC 2.32 (70% predicted) postbronchodilator ratio 88, FEV1 78, no significant bronchodilator response, TLC 75, DLCO 81 >>>Spirometry suggestive of restrictive defect, mild restriction confirmed on lung volumes, normal diffusion capacity no significant bronchodilator response 01/19/15-echocardiogram-LV ejection fraction 60 to 65%,  FENO:  No results found for: NITRICOXIDE  PFT: PFT Results Latest Ref Rng & Units 03/10/2015  FVC-Pre L 2.32  FVC-Predicted Pre % 70  FVC-Post L 2.36  FVC-Predicted Post % 71  Pre FEV1/FVC % % 90  Post FEV1/FCV % % 88  FEV1-Pre L 2.09  FEV1-Predicted Pre % 78  DLCO UNC% % 81  DLCO COR %Predicted % 104  TLC L 4.19  TLC % Predicted % 75  RV % Predicted % 69    Imaging: No results found.  Chart Review:    Specialty Problems      Pulmonary Problems   Allergic rhinitis    Qualifier: Diagnosis of  By: Moshe Cipro MD, Margaret       Acute bronchitis   COPD (chronic obstructive pulmonary disease) ([place])   DOE (dyspnea on exertion)   Mild intermittent reactive airway disease with wheezing without complication   Multiple pulmonary nodules    Noted on lung scan for PE, done 05/2017, /o nicotine use refer to pulmonary         Allergies  Allergen Reactions  . Wellbutrin [Bupropion] Shortness Of Breath  . Ibuprofen Hives  . Statins Other (See Comments)    Muscle cramps    Immunization History  Administered Date(s) Administered  . Influenza Split 05/08/2014  . Influenza Whole 05/29/2016  . Influenza,inj,Quad PF,6+ Mos 07/22/2013, 06/23/2015, 05/29/2017  . Pneumococcal Polysaccharide-23 07/22/2013  . Td 03/10/2008    Past Medical History:  Diagnosis Date  . Allergy   . Arthritis    knees  . Chronic headaches   . Depression   . Diabetes ([place])   . GERD (gastroesophageal reflux disease)   . Herpes zoster  complicated 07/7614   affecting right eye with blured vision  . Hypertension   . Nicotine addiction   . Obesity   . Sinusitis     Tobacco History: Social History   Tobacco Use  Smoking Status Former Smoker  . Packs/day: 1.50  . Years: 30.00  . Pack years: 45.00  . Types: Cigarettes  . Last attempt to quit: 03/05/2014  . Years since quitting: 4.2  Smokeless Tobacco Never Used   Counseling given: Yes   Outpatient Encounter Medications as of 05/18/2018  Medication Sig  . acetaminophen (TYLENOL) 325 MG tablet Take 650 mg daily as needed by mouth for mild pain.   . budesonide-formoterol (SYMBICORT) 160-4.5 MCG/ACT inhaler Inhale 2 puffs into the lungs 2 (two) times daily.  . budesonide-formoterol (SYMBICORT) 80-4.5 MCG/ACT inhaler Inhale 2 puffs into the lungs 2 (two) times daily.  Marland Kitchen FARXIGA 5 MG TABS tablet Take 5 mg by mouth daily.  . fluticasone (FLONASE) 50 MCG/ACT nasal spray USE TWO SPRAY(S) IN EACH NOSTRIL ONCE DAILY  . furosemide (LASIX) 40 MG tablet  Take 40 mg by mouth daily.  . hydrochlorothiazide (HYDRODIURIL) 12.5 MG tablet Take 12.5 mg by mouth daily.   Marland Kitchen loratadine (CLARITIN) 10 MG tablet Take 1 tablet (10 mg total) by mouth daily.  Marland Kitchen losartan (COZAAR) 100 MG tablet Take 1 tablet (100 mg total) by mouth daily.  . meloxicam (MOBIC) 15 MG tablet Take 15 mg by mouth daily.  Marland Kitchen PROAIR HFA 108 (90 Base) MCG/ACT inhaler INHALE TWO PUFFS INTO LUNGS EVERY 6 HOURS AS NEEDED FOR WHEEZING OR  SHORTNESS  OF  BREATH  . TRINTELLIX 10 MG TABS tablet Take 10 mg by mouth daily.  Marland Kitchen albuterol (PROVENTIL) (2.5 MG/3ML) 0.083% nebulizer solution 1 vial every 4-6 hours as needed for shortness of breath and wheezing  . budesonide-formoterol (SYMBICORT) 160-4.5 MCG/ACT inhaler Inhale 2 puffs into the lungs 2 (two) times daily.  . cyclobenzaprine (FLEXERIL) 10 MG tablet Take 1 tablet (10 mg total) at bedtime by mouth. (Patient not taking: Reported on 05/18/2018)  . FLUoxetine (PROZAC) 10 MG  capsule Take 10 mg daily by mouth.  Marland Kitchen FLUoxetine (PROZAC) 10 MG tablet TAKE 1 TABLET BY MOUTH ONCE DAILY (Patient not taking: Reported on 05/18/2018)  . gabapentin (NEURONTIN) 300 MG capsule Take 300 mg by mouth daily.  . metFORMIN (GLUCOPHAGE) 500 MG tablet TAKE 1 TABLET BY MOUTH TWICE DAILY WITH MEALS (Patient not taking: Reported on 05/18/2018)   No facility-administered encounter medications on file as of 05/18/2018.      Review of Systems  Review of Systems  Constitutional: Positive for fatigue. Negative for chills, fever and unexpected weight change.  HENT: Positive for congestion and postnasal drip. Negative for ear pain, sinus pressure and sinus pain.        Ears itch, ears sores?  Respiratory: Positive for shortness of breath (worsened ) and wheezing (with exertion ). Negative for cough and chest tightness.   Cardiovascular: Negative for chest pain and palpitations.  Gastrointestinal: Positive for constipation. Negative for blood in stool, diarrhea, nausea and vomiting.  Genitourinary: Negative for dysuria, frequency and urgency.  Musculoskeletal: Negative for arthralgias.  Skin: Negative for color change.  Allergic/Immunologic: Negative for environmental allergies and food allergies.  Neurological: Negative for dizziness, light-headedness and headaches.  Psychiatric/Behavioral: Negative for dysphoric mood. The patient is not nervous/anxious.   All other systems reviewed and are negative.    Physical Exam  BP 118/82 (BP Location: Left Wrist, Cuff Size: Normal)   Pulse (!) 116   Temp 98.4 F (36.9 C) (Oral)   Ht 5\' 7"  (1.702 m)   Wt (!) 386 lb 12.8 oz (175.5 kg)   SpO2 96%   BMI 60.58 kg/m   Wt Readings from Last 5 Encounters:  05/18/18 (!) 386 lb 12.8 oz (175.5 kg)  09/11/17 (!) 379 lb (171.9 kg)  06/30/17 (!) 378 lb 1.9 oz (171.5 kg)  06/20/17 (!) 373 lb (169.2 kg)  06/01/17 (!) 379 lb (171.9 kg)     Physical Exam  Constitutional: She is oriented to person,  place, and time and well-developed, well-nourished, and in no distress. No distress.  HENT:  Head: Normocephalic and atraumatic.  Right Ear: Hearing, tympanic membrane, external ear and ear canal normal.  Left Ear: Hearing, tympanic membrane, external ear and ear canal normal.  Nose: Nose normal. Right sinus exhibits no maxillary sinus tenderness and no frontal sinus tenderness. Left sinus exhibits no maxillary sinus tenderness and no frontal sinus tenderness.  Mouth/Throat: Uvula is midline and oropharynx is clear and moist. No oropharyngeal exudate.  Eyes: Pupils are equal, round, and reactive to light.  Neck: Normal range of motion. Neck supple. No JVD present.  Cardiovascular: Regular rhythm and normal heart sounds. Tachycardia present.  Pulmonary/Chest: Effort normal and breath sounds normal. No accessory muscle usage. No respiratory distress. She has no decreased breath sounds. She has no wheezes. She has no rhonchi.  Alight upper airway wheeze, resolved after coughing  Abdominal: Soft. Bowel sounds are normal. There is no tenderness.  Musculoskeletal: Normal range of motion. She exhibits no edema.  Lymphadenopathy:    She has no cervical adenopathy.  Neurological: She is alert and oriented to person, place, and time. Gait normal.  Skin: Skin is warm and dry. She is not diaphoretic. No erythema.  Psychiatric: Mood, memory, affect and judgment normal.  Nursing note and vitals reviewed.     Lab Results:  CBC    Component Value Date/Time   WBC 6.1 05/29/2017 1928   RBC 5.36 (H) 05/29/2017 1928   HGB 11.8 (L) 05/29/2017 1928   HCT 38.8 05/29/2017 1928   PLT 242 05/29/2017 1928   MCV 72.4 (L) 05/29/2017 1928   MCH 22.0 (L) 05/29/2017 1928   MCHC 30.4 05/29/2017 1928   RDW 15.7 (H) 05/29/2017 1928   LYMPHSABS 2.5 05/29/2017 1928   MONOABS 0.5 05/29/2017 1928   EOSABS 0.2 05/29/2017 1928   BASOSABS 0.1 05/29/2017 1928    BMET    Component Value Date/Time   NA 143  05/29/2017 1928   K 3.8 05/29/2017 1928   CL 98 (L) 05/29/2017 1928   CO2 28 05/29/2017 1928   GLUCOSE 142 (H) 05/29/2017 1928   BUN 10 05/29/2017 1928   CREATININE 0.64 05/29/2017 1928   CREATININE 0.79 05/24/2017 0852   CALCIUM 10.0 05/29/2017 1928   GFRNONAA >60 05/29/2017 1928   GFRNONAA 87 05/24/2017 0852   GFRAA >60 05/29/2017 1928   GFRAA 100 05/24/2017 0852    BNP    Component Value Date/Time   BNP 15.0 05/29/2017 1902    ProBNP No results found for: PROBNP    Assessment & Plan:   52 year old female patient coming in for acute visit.  I will have patient start daily antihistamine as well as consider starting Flonase to get better management of her allergic rhinitis symptoms.  Patient has scheduled follow-up.  We will bring patient back in 4 to 6 weeks to see how she is doing.  Mild intermittent reactive airway disease with wheezing without complication Restart Symbicort 160 >>> 2 puffs in the morning right when you wake up, rinse out your mouth after use, 12 hours later 2 puffs, rinse after use >>> Take this daily, no matter what >>> This is not a rescue inhaler   Only use your albuterol as a rescue medication to be used if you can't catch your breath by resting or doing a relaxed purse lip breathing pattern.  - The less you use it, the better it will work when you need it. - Ok to use up to 2 puffs  every 4 hours if you must but call for immediate appointment if use goes up over your usual need - Don't leave home without it !!  (think of it like the spare tire for your car)   We will also prescribe albuterol nebulizers >>>With nebulizer machine >>>Can use nebulizer every 4-6 hours as needed for shortness of breath or wheezing >>>If you are at home and able to use the nebulizer use this instead of your rescue inhaler  Follow-up with primary care >>> Discussed concerns with losartan and see if they will trial you off of this blood pressure medication to see if  this helps with your breathing  We will request records from primary care for office notes as well as your cardiopulmonary stress test      Allergic rhinitis  Restart Claritin/loratadine >>>Take 1 tablet daily for allergy symptoms  Can use Flonase 1 spray each nostril as needed daily for nasal congestion or allergy symptoms    BMI 60.0-69.9, adult ([place]) Continue to work towards healthy weight     Lauraine Rinne, NP 05/18/2018

## 2018-05-19 NOTE — Progress Notes (Signed)
Reviewed, agree 

## 2018-05-20 NOTE — Progress Notes (Signed)
Reviewed and agree with assessment/plan.   Shannara Winbush, MD [facility] 07/20/2016, 12:24 PM Pager:  [phone]  

## 2018-05-23 ENCOUNTER — Telehealth: Payer: Self-pay | Admitting: Pulmonary Disease

## 2018-05-23 NOTE — Telephone Encounter (Signed)
Okay to refill. I am glad it is working for the patient.   Ensure patient also started a daily antihistamine for allergy symptoms.   Wyn Quaker FNP

## 2018-05-23 NOTE — Telephone Encounter (Signed)
Patient was a new start on symbicort and states it is working. I will route this to Wyn Quaker FNP to address. Patient needs medication send to [facility] on [address].  Will route to sell as follow up with Aaron Edelman on Thursday 05/24/2018  Nothing further needed,

## 2018-05-24 MED ORDER — BUDESONIDE-FORMOTEROL FUMARATE 160-4.5 MCG/ACT IN AERO: 2.0000 | INHALATION_SPRAY | Freq: Two times a day (BID) | RESPIRATORY_TRACT | 5 refills | Status: DC

## 2018-05-24 NOTE — Telephone Encounter (Signed)
Called and spoke to patient. She is taking Zyrtek daily, and states she is feeling 100% better.  and a RX sent to preferred pharmacy   Nothing further needed.

## 2018-05-24 NOTE — Addendum Note (Signed)
Addended by: Amado Coe on: 05/24/2018 04:12 PM   Modules accepted: Orders

## 2018-06-20 ENCOUNTER — Ambulatory Visit: Payer: Self-pay | Admitting: Pulmonary Disease

## 2018-06-20 ENCOUNTER — Encounter: Payer: Self-pay | Admitting: Pulmonary Disease

## 2018-06-20 ENCOUNTER — Ambulatory Visit (INDEPENDENT_AMBULATORY_CARE_PROVIDER_SITE_OTHER): Payer: Managed Care, Other (non HMO) | Admitting: Pulmonary Disease

## 2018-06-20 DIAGNOSIS — Z6841 Body Mass Index (BMI) 40.0 and over, adult: Secondary | ICD-10-CM | POA: Diagnosis not present

## 2018-06-20 DIAGNOSIS — R0609 Other forms of dyspnea: Secondary | ICD-10-CM | POA: Diagnosis not present

## 2018-06-20 DIAGNOSIS — J4520 Mild intermittent asthma, uncomplicated: Secondary | ICD-10-CM

## 2018-06-20 MED ORDER — ALBUTEROL SULFATE HFA 108 (90 BASE) MCG/ACT IN AERS: INHALATION_SPRAY | RESPIRATORY_TRACT | 3 refills | Status: AC

## 2018-06-20 NOTE — Assessment & Plan Note (Signed)
Work with primary care regarding healthy weight loss  Offered nutritionist referral as well as referral to healthy weight loss clinic he declined this today  Continue Symbicort 160 >>> 2 puffs in the morning right when you wake up, rinse out your mouth after use, 12 hours later 2 puffs, rinse after use >>> Take this daily, no matter what >>> This is not a rescue inhaler   Only use your albuterol as a rescue medication to be used if you can't catch your breath by resting or doing a relaxed purse lip breathing pattern.  - The less you use it, the better it will work when you need it. - Ok to use up to 2 puffs  every 4 hours if you must but call for immediate appointment if use goes up over your usual need - Don't leave home without it !!  (think of it like the spare tire for your car)   Work on increasing exercise  Stop drinking sodas Avoid fast food Eat whole nutritious meals Review diet information listed below

## 2018-06-20 NOTE — Assessment & Plan Note (Addendum)
Work with primary care regarding healthy weight loss  Offered nutritionist referral as well as referral to healthy weight loss clinic she declined this today  Work on increasing exercise  Stop drinking sodas Avoid fast food Eat whole nutritious meals Review diet information listed below

## 2018-06-20 NOTE — Patient Instructions (Signed)
Work with primary care regarding healthy weight loss  Offered nutritionist referral as well as referral to healthy weight loss clinic he declined this today  Continue Symbicort 160 >>> 2 puffs in the morning right when you wake up, rinse out your mouth after use, 12 hours later 2 puffs, rinse after use >>> Take this daily, no matter what >>> This is not a rescue inhaler   Only use your albuterol as a rescue medication to be used if you can't catch your breath by resting or doing a relaxed purse lip breathing pattern.  - The less you use it, the better it will work when you need it. - Ok to use up to 2 puffs  every 4 hours if you must but call for immediate appointment if use goes up over your usual need - Don't leave home without it !!  (think of it like the spare tire for your car)    Work on increasing exercise  Stop drinking sodas Avoid fast food Eat whole nutritious meals Review diet information listed below  It is flu season:   >>>Remember to be washing your hands regularly, using hand sanitizer, be careful to use around herself with has contact with people who are sick will increase her chances of getting sick yourself. >>> Best ways to protect herself from the flu: Receive the yearly flu vaccine, practice good hand hygiene washing with soap and also using hand sanitizer when available, eat a nutritious meals, get adequate rest, hydrate appropriately   Please contact the office if your symptoms worsen or you have concerns that you are not improving.   Thank you for choosing [facility] for your healthcare, and for allowing [place] to partner with you on your healthcare journey. I am thankful to be able to provide care to you today.   Wyn Quaker FNP-C   Diet for Metabolic Syndrome Metabolic syndrome is a disorder that includes at least three of these conditions:  Abdominal obesity.  Too much sugar in your blood.  High blood pressure.  Higher than normal amount of  fat (lipids) in your blood.  Lower than normal level of "good" cholesterol (HDL).  Following a healthy diet can help to keep metabolic syndrome under control. It can also help to prevent the development of conditions that are associated with metabolic syndrome, such as diabetes, heart disease, and stroke. Along with exercise, a healthy diet:  Helps to improve the way that the body uses insulin.  Promotes weight loss. A common goal for people with this condition is to lose at least 7 to 10 percent of their starting weight.  What do I need to know about this diet?  Use the glycemic index (GI) to plan your meals. The index tells you how quickly a food will raise your blood sugar. Choose foods that have low GI values. These foods take a longer time to raise blood sugar.  Keep track of how many calories you take in. Eating the right amount of calories will help your achieve a healthy weight.  You may want to follow a Mediterranean diet. This diet includes lots of vegetables, lean meats or fish, whole grains, fruits, and healthy oils and fats. What foods can I eat? Grains Stone-ground whole wheat. Pumpernickel bread. Whole-grain bread, crackers, tortillas, cereal, and pasta. Unsweetened oatmeal.Bulgur.Barley.Quinoa.Brown rice or wild rice. Vegetables Lettuce. Spinach. Peas. Beets. Cauliflower. Cabbage. Broccoli. Carrots. Tomatoes. Squash. Eggplant. Herbs. Peppers. Onions. Cucumbers. Brussels sprouts. Sweet potatoes. Yams. Beans. Lentils. Fruits Berries. Apples.  Oranges. Grapes. Mango. Pomegranate. Kiwi. Cherries. Meats and Other Protein Sources Seafood and shellfish. Lean meats.Poultry. Tofu. Dairy Low-fat or fat-free dairy products, such as milk, yogurt, and cheese. Beverages Water. Low-fat milk. Milk alternatives, like soy milk or almond milk. Real fruit juice. Condiments Low-sugar or sugar-free ketchup, barbecue sauce, and mayonnaise. Mustard. Relish. Fats and Oils Avocado. Canola  or olive oil. Nuts and nut butters.Seeds. The items listed above may not be a complete list of recommended foods or beverages. Contact your dietitian for more options. What foods are not recommended? Red meat. Palm oil and coconut oil. Processed foods. Fried foods. Alcohol. Sweetened drinks, such as iced tea and soda. Sweets. Salty foods. The items listed above may not be a complete list of foods and beverages to avoid. Contact your dietitian for more information. This information is not intended to replace advice given to you by your health care provider. Make sure you discuss any questions you have with your health care provider. Document Released: 11/25/2014 Document Revised: 11/20/2015 Document Reviewed: 07/23/2014 Elsevier Interactive Patient Education  2018 [employer].

## 2018-06-20 NOTE — Assessment & Plan Note (Signed)
Work with primary care regarding healthy weight loss  Offered nutritionist referral as well as referral to healthy weight loss clinic she declined this today  Work on increasing exercise  Stop drinking sodas Avoid fast food Eat whole nutritious meals Review diet information listed below

## 2018-06-20 NOTE — Progress Notes (Signed)
@Patient  ID: [name], female    DOB: 1965/08/08, 52 y.o.   MRN: [id number]  Chief Complaint  Patient presents with  . Follow-up    Shortness of breath    Referring provider: Windell Hummingbird, PA-C  HPI:  52 y.o. female former smoker ( Quit 2015 with a 45 pack year history)  with COPD, dyspnea on exertion, wheezing and recurrent bronchitis. She was seen as a consult  by Dr. Halford Chessman 02/2015 for dyspnea.  Patient drives a bus.  PMH: denies any known history of rheumatoid arthritis or lupus.  No previous exposure to Macrodantin,amiodarone, or methotrexate. Smoker/ Smoking History: Former smoker.  Quit 2015.  45-pack-year history. Maintenance:  Symbicort 160 Pt of: Dr. Halford Chessman   06/20/2018  - Visit   52 year old female patient presenting today for follow-up visit.  Patient reports that her breathing has been a [place] bit better since last office visit.  She still has shortness of breath with exertion.  Patient reports she uses her rescue inhaler about 2-3 times a day.  Uses her nebulized treatments about 2-3 times a week.  Patient reports adherence to Symbicort 160.  Patient saw a commercial is wondering if she would be a good candidate for Spiriva.  Patient has had extensive work-up for dyspnea on exertion.  PFTs showed no obstruction but restriction.  This is probably due to patient's morbid obesity.  Patient's weight continues to increase today.  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  Patient had considered gastric bypass or weight loss surgery about 3 years ago when she was in a form that her insurance does not cover it.   Tests:   08/21/2017: HRCT No findings to suggest interstitial lung disease. Mild air trapping indicative of mild small airways disease. Multiple tiny pulmonary nodules scattered throughout the lungs bilaterally measuring 5 mm or less in size. These are  nonspecific but are statistically likely benign. No follow-up needed if patient is low-risk (and has no known or suspected primary neoplasm). Non-contrast chest CT can be considered in 12 months if patient is high-risk.   05/2017 CT Angio: Nondiagnostic study for evaluation of pulmonary emboli. This is due to a combination of poor contrast bolus and patient's large habitus which results in overall grainy appearance of the images. Multiple bilateral pulmonary nodules. Non-contrast chest CT at 3-6 months is recommended. If the nodules are stable at time of repeat CT, then future CT at 18-24 months (from today's scan) is considered optional for low-risk patients, but is recommended for high-risk patients.  Echo 01/19/15>>EF 60 to 65%, normal PA pressures PFT 03/10/15>>FEV1 2.09 (78%), FEV1% 90, TLC 4.19 (75%), DLCO 81%, no BD   08/21/17-CT chest high-res- no findings of interstitial lung disease, mild air trapping, multiple tiny pulmonary nodules bilaterally 5 mm or less, noncontrast chest CT can be considered in 12 months if patient is high risk 05/29/2017-CT Angio- nondiagnostic study for PE, poor contrast bolus, multiple bilateral pulmonary nodules, noncontrast chest CT in 3 to 6 months is recommended 06/20/2017-spirometry- FVC 2.0 (64% predicted), ratio 87, FEV1 70%, moderate restriction 03/10/2015-pulmonary function test-FVC 2.32 (70% predicted) postbronchodilator ratio 88, FEV1 78, no significant bronchodilator response, TLC 75, DLCO 81 >>>Spirometry suggestive of restrictive defect, mild restriction confirmed on lung volumes, normal diffusion capacity no significant bronchodilator response 01/19/15-echocardiogram-LV ejection fraction 60 to 65%,  FENO:  No results found for: NITRICOXIDE  PFT: PFT Results Latest Ref Rng & Units 03/10/2015  FVC-Pre L 2.32  FVC-Predicted Pre % 70  FVC-Post L 2.36  FVC-Predicted Post % 71  Pre FEV1/FVC % % 90  Post FEV1/FCV % % 88  FEV1-Pre L 2.09    FEV1-Predicted Pre % 78  FEV1-Post L 2.08  DLCO UNC% % 81  DLCO COR %Predicted % 104  TLC L 4.19  TLC % Predicted % 75  RV % Predicted % 69    Imaging: No results found.  Chart Review:    Specialty Problems      Pulmonary Problems   Allergic rhinitis    Qualifier: Diagnosis of  By: Moshe Cipro MD, Margaret       Acute bronchitis   COPD (chronic obstructive pulmonary disease) ([place])   DOE (dyspnea on exertion)   Mild intermittent reactive airway disease with wheezing without complication   Multiple pulmonary nodules    Noted on lung scan for PE, done 05/2017, /o nicotine use refer to pulmonary         Allergies  Allergen Reactions  . Wellbutrin [Bupropion] Shortness Of Breath  . Ibuprofen Hives  . Statins Other (See Comments)    Muscle cramps    Immunization History  Administered Date(s) Administered  . Influenza Split 05/08/2014  . Influenza Whole 05/29/2016, 04/16/2018  . Influenza,inj,Quad PF,6+ Mos 07/22/2013, 06/23/2015, 05/29/2017  . Pneumococcal Polysaccharide-23 07/22/2013  . Td 03/10/2008    Past Medical History:  Diagnosis Date  . Allergy   . Arthritis    knees  . Chronic headaches   . Depression   . Diabetes ([place])   . GERD (gastroesophageal reflux disease)   . Herpes zoster complicated 11/1023   affecting right eye with blured vision  . Hypertension   . Nicotine addiction   . Obesity   . Sinusitis     Tobacco History: Social History   Tobacco Use  Smoking Status Former Smoker  . Packs/day: 1.50  . Years: 30.00  . Pack years: 45.00  . Types: Cigarettes  . Last attempt to quit: 03/05/2014  . Years since quitting: 4.2  Smokeless Tobacco Never Used   Counseling given: Yes  Continue to not smoke  Outpatient Encounter Medications as of 06/20/2018  Medication Sig  . acetaminophen (TYLENOL) 325 MG tablet Take 650 mg daily as needed by mouth for mild pain.   Marland Kitchen albuterol (PROVENTIL) (2.5 MG/3ML) 0.083% nebulizer solution 1 vial every  4-6 hours as needed for shortness of breath and wheezing  . budesonide-formoterol (SYMBICORT) 160-4.5 MCG/ACT inhaler Inhale 2 puffs into the lungs 2 (two) times daily.  . cyclobenzaprine (FLEXERIL) 10 MG tablet Take 1 tablet (10 mg total) at bedtime by mouth.  Marland Kitchen FARXIGA 5 MG TABS tablet Take 5 mg by mouth daily.  Marland Kitchen FLUoxetine (PROZAC) 10 MG capsule Take 10 mg daily by mouth.  Marland Kitchen FLUoxetine (PROZAC) 10 MG tablet TAKE 1 TABLET BY MOUTH ONCE DAILY  . fluticasone (FLONASE) 50 MCG/ACT nasal spray USE TWO SPRAY(S) IN EACH NOSTRIL ONCE DAILY  . furosemide (LASIX) 40 MG tablet Take 40 mg by mouth daily.  . hydrochlorothiazide (HYDRODIURIL) 12.5 MG tablet Take 12.5 mg by mouth daily.   Marland Kitchen loratadine (CLARITIN) 10 MG tablet Take 1 tablet (10 mg total) by mouth daily.  Marland Kitchen losartan (COZAAR) 100 MG tablet Take 1 tablet (100 mg total) by mouth daily.  . meloxicam (MOBIC) 15 MG tablet Take 15 mg by mouth daily.  . metFORMIN (GLUCOPHAGE) 500 MG tablet TAKE 1  TABLET BY MOUTH TWICE DAILY WITH MEALS  . PROAIR HFA 108 (90 Base) MCG/ACT inhaler INHALE TWO PUFFS INTO LUNGS EVERY 6 HOURS AS NEEDED FOR WHEEZING OR  SHORTNESS  OF  BREATH  . TRINTELLIX 10 MG TABS tablet Take 10 mg by mouth daily.  . [DISCONTINUED] budesonide-formoterol (SYMBICORT) 160-4.5 MCG/ACT inhaler Inhale 2 puffs into the lungs 2 (two) times daily.  . [DISCONTINUED] budesonide-formoterol (SYMBICORT) 80-4.5 MCG/ACT inhaler Inhale 2 puffs into the lungs 2 (two) times daily.  Marland Kitchen gabapentin (NEURONTIN) 300 MG capsule Take 300 mg by mouth daily.   No facility-administered encounter medications on file as of 06/20/2018.      Review of Systems  Review of Systems  Constitutional: Positive for fatigue. Negative for chills, fever and unexpected weight change.  HENT: Negative for congestion, ear pain, postnasal drip, sinus pressure and sinus pain.   Respiratory: Positive for shortness of breath. Negative for cough, chest tightness and wheezing.     Cardiovascular: Negative for chest pain and palpitations.       +orthopnea  Gastrointestinal: Negative for blood in stool, diarrhea, nausea and vomiting.  Genitourinary: Negative for dysuria, frequency and urgency.  Musculoskeletal: Negative for arthralgias.  Skin: Negative for color change.  Allergic/Immunologic: Negative for environmental allergies and food allergies.  Neurological: Negative for dizziness, light-headedness and headaches.  Psychiatric/Behavioral: Negative for dysphoric mood. The patient is not nervous/anxious.   All other systems reviewed and are negative.    Physical Exam  BP 122/74 (BP Location: Left Arm, Cuff Size: Large)   Pulse (!) 107   Ht 5\' 7"  (1.702 m)   Wt (!) 389 lb 3.2 oz (176.5 kg)   SpO2 97%   BMI 60.96 kg/m   Wt Readings from Last 5 Encounters:  06/20/18 (!) 389 lb 3.2 oz (176.5 kg)  05/18/18 (!) 386 lb 12.8 oz (175.5 kg)  09/11/17 (!) 379 lb (171.9 kg)  06/30/17 (!) 378 lb 1.9 oz (171.5 kg)  06/20/17 (!) 373 lb (169.2 kg)    Physical Exam  Constitutional: She is oriented to person, place, and time and well-developed, well-nourished, and in no distress. Vital signs are normal. No distress.  + Morbidly obese female  HENT:  Head: Normocephalic and atraumatic.  Right Ear: Hearing, tympanic membrane, external ear and ear canal normal.  Left Ear: Hearing, tympanic membrane, external ear and ear canal normal.  Nose: Nose normal. Right sinus exhibits no maxillary sinus tenderness and no frontal sinus tenderness. Left sinus exhibits no maxillary sinus tenderness and no frontal sinus tenderness.  Mouth/Throat: Uvula is midline and oropharynx is clear and moist. No oropharyngeal exudate.  Eyes: Pupils are equal, round, and reactive to light.  Neck: Normal range of motion. Neck supple. No thyromegaly present.  Cardiovascular: Normal rate, regular rhythm and normal heart sounds.  Pulmonary/Chest: Effort normal and breath sounds normal. No accessory  muscle usage. No respiratory distress. She has no decreased breath sounds. She has no wheezes. She has no rhonchi.  Musculoskeletal: Normal range of motion. She exhibits no edema.  Lymphadenopathy:    She has no cervical adenopathy.  Neurological: She is alert and oriented to person, place, and time. Gait normal.  Skin: Skin is warm and dry. She is not diaphoretic. No erythema.  Psychiatric: Mood, memory, affect and judgment normal.  Nursing note and vitals reviewed.     Lab Results:  CBC    Component Value Date/Time   WBC 6.1 05/29/2017 1928   RBC 5.36 (H) 05/29/2017 1928   HGB  11.8 (L) 05/29/2017 1928   HCT 38.8 05/29/2017 1928   PLT 242 05/29/2017 1928   MCV 72.4 (L) 05/29/2017 1928   MCH 22.0 (L) 05/29/2017 1928   MCHC 30.4 05/29/2017 1928   RDW 15.7 (H) 05/29/2017 1928   LYMPHSABS 2.5 05/29/2017 1928   MONOABS 0.5 05/29/2017 1928   EOSABS 0.2 05/29/2017 1928   BASOSABS 0.1 05/29/2017 1928    BMET    Component Value Date/Time   NA 143 05/29/2017 1928   K 3.8 05/29/2017 1928   CL 98 (L) 05/29/2017 1928   CO2 28 05/29/2017 1928   GLUCOSE 142 (H) 05/29/2017 1928   BUN 10 05/29/2017 1928   CREATININE 0.64 05/29/2017 1928   CREATININE 0.79 05/24/2017 0852   CALCIUM 10.0 05/29/2017 1928   GFRNONAA >60 05/29/2017 1928   GFRNONAA 87 05/24/2017 0852   GFRAA >60 05/29/2017 1928   GFRAA 100 05/24/2017 0852    BNP    Component Value Date/Time   BNP 15.0 05/29/2017 1902    ProBNP No results found for: PROBNP    Assessment & Plan:   Pleasant 52 year old female patient completing follow-up with our office today.  I discussed with patient in depth her work-up for shortness of breath.  We reviewed high-res CT as well as previous pulmonary function test as well as last office spirometry.  As we have previously discussed at previous office visits that patient shortness of breath is largely affected by her current weight.  Her weight continues to increase.  Patient  agrees she needs to lose weight.  I have offered referral to nutritionist as well as a referral to healthy weight loss clinic or further evaluation for gastric bypass.  Patient declines today.  Patient reports that she will work on healthy weight loss herself.  Patient reports that she is working on her diet and has stopped eating fast food.  Patient knows she needs to stop drinking sodas.  Patient to follow-up with primary care and discuss weight loss options with primary care.  Patient to follow-up with our office in 3 to 6 months or sooner if she has worsening shortness of breath.  I discussed with patient that I do not believe Spiriva Respimat or Stiolto Respimat would be options for her I think Symbicort 160 will manage her breathing fine she agrees.  Patient knows that if she can contact our office if she would like to be referred for healthy weight loss clinic.  Mild intermittent reactive airway disease with wheezing without complication Work with primary care regarding healthy weight loss  Offered nutritionist referral as well as referral to healthy weight loss clinic he declined this today  Continue Symbicort 160 >>> 2 puffs in the morning right when you wake up, rinse out your mouth after use, 12 hours later 2 puffs, rinse after use >>> Take this daily, no matter what >>> This is not a rescue inhaler   Only use your albuterol as a rescue medication to be used if you can't catch your breath by resting or doing a relaxed purse lip breathing pattern.  - The less you use it, the better it will work when you need it. - Ok to use up to 2 puffs  every 4 hours if you must but call for immediate appointment if use goes up over your usual need - Don't leave home without it !!  (think of it like the spare tire for your car)   Work on increasing exercise  Stop drinking sodas  Avoid fast food Eat whole nutritious meals Review diet information listed below  DOE (dyspnea on exertion) Work with  primary care regarding healthy weight loss  Offered nutritionist referral as well as referral to healthy weight loss clinic he declined this today  Continue Symbicort 160 >>> 2 puffs in the morning right when you wake up, rinse out your mouth after use, 12 hours later 2 puffs, rinse after use >>> Take this daily, no matter what >>> This is not a rescue inhaler   Only use your albuterol as a rescue medication to be used if you can't catch your breath by resting or doing a relaxed purse lip breathing pattern.  - The less you use it, the better it will work when you need it. - Ok to use up to 2 puffs  every 4 hours if you must but call for immediate appointment if use goes up over your usual need - Don't leave home without it !!  (think of it like the spare tire for your car)   Work on increasing exercise  Stop drinking sodas Avoid fast food Eat whole nutritious meals Review diet information listed below  Morbid obesity Work with primary care regarding healthy weight loss  Offered nutritionist referral as well as referral to healthy weight loss clinic she declined this today  Work on increasing exercise  Stop drinking sodas Avoid fast food Eat whole nutritious meals Review diet information listed below  BMI 60.0-69.9, adult ([place]) Work with primary care regarding healthy weight loss  Offered nutritionist referral as well as referral to healthy weight loss clinic she declined this today  Work on increasing exercise  Stop drinking sodas Avoid fast food Eat whole nutritious meals Review diet information listed below  This appointment was 29 minutes along with over 50% of that time direct face-to-face patient care, assessment, plan of care follow-up.   Lauraine Rinne, NP 06/20/2018

## 2018-06-20 NOTE — Addendum Note (Signed)
Addended by: Amado Coe on: 06/20/2018 12:54 PM   Modules accepted: Orders

## 2018-07-03 NOTE — Progress Notes (Signed)
Reviewed and agree with assessment/plan.   Josia Cueva, MD [facility] 07/20/2016, 12:24 PM Pager:  [phone]  

## 2018-09-11 ENCOUNTER — Other Ambulatory Visit: Payer: Self-pay

## 2018-09-11 ENCOUNTER — Inpatient Hospital Stay: Admission: RE | Admit: 2018-09-11 | Payer: Self-pay | Source: Ambulatory Visit

## 2018-09-11 DIAGNOSIS — R911 Solitary pulmonary nodule: Secondary | ICD-10-CM

## 2018-10-25 ENCOUNTER — Encounter: Payer: Self-pay | Admitting: Pulmonary Disease

## 2018-11-01 DIAGNOSIS — H40003 Preglaucoma, unspecified, bilateral: Secondary | ICD-10-CM | POA: Insufficient documentation

## 2018-11-12 ENCOUNTER — Telehealth: Payer: Self-pay

## 2018-11-12 NOTE — Telephone Encounter (Signed)
-----   Message from Satira Anis sent at 09/18/2018  8:58 AM EST ----- FYI:  Pt does not want the CT.   ----- Message ----- From: Osvaldo Shipper, NT Sent: 09/18/2018   8:27 AM EST To: Raynald Kemp Please let the provider know that the patient does not want to have ct.   Erline Levine  ----- Message ----- From: Vella Kohler D Sent: 09/11/2018   9:21 AM EST To: Osvaldo Shipper, NT  I am showing that this order does not expire until 06/08/2019.  Is it because the appointment was cancelled yesterday? ----- Message ----- From: Osvaldo Shipper, NT Sent: 09/10/2018   5:02 PM EST To: Holly D [place] [id number] Group [id number]  [phone]  I need a new order order has EXP as well this is the new ins card   [place]

## 2018-12-19 ENCOUNTER — Ambulatory Visit: Payer: Self-pay | Admitting: Pulmonary Disease

## 2018-12-23 DIAGNOSIS — Z9889 Other specified postprocedural states: Secondary | ICD-10-CM | POA: Insufficient documentation

## 2019-04-10 ENCOUNTER — Encounter: Payer: Self-pay | Admitting: Gastroenterology

## 2022-06-13 ENCOUNTER — Encounter: Payer: Self-pay | Admitting: Gastroenterology

## 2022-07-04 ENCOUNTER — Telehealth: Payer: Self-pay

## 2022-07-04 NOTE — Telephone Encounter (Signed)
Dr. Lauretta Grill is scheduled for PV on 07/11/22 for a colonoscopy in [place] on 08/12/22. You did her last procedure at [facility] in 2017. The patients BMI as of 05/05/22 was 55.88. Do you want to see her in the office prior to being scheduled at [facility] or can she be a direct procedure at [facility]? Please advise.  Riki Sheer, LPN

## 2022-07-05 ENCOUNTER — Other Ambulatory Visit: Payer: Self-pay

## 2022-07-05 DIAGNOSIS — Z86010 Personal history of colonic polyps: Secondary | ICD-10-CM

## 2022-07-05 NOTE — Telephone Encounter (Signed)
Patient contacted and scheduled. Pre-visit 08/15/22 and colonoscopy at [facility] 08/29/22.

## 2022-07-05 NOTE — Telephone Encounter (Signed)
She can be direct at [facility], next available appt. Thanks

## 2022-07-05 NOTE — Telephone Encounter (Signed)
Beth, Could you schedule this please?  Thanks, [employer]

## 2022-08-08 ENCOUNTER — Telehealth: Payer: Self-pay | Admitting: *Deleted

## 2022-08-08 NOTE — Telephone Encounter (Signed)
Yesi,  This pt's BMI is greater than 50; their procedure will need to be performed at the hospital.  Thanks,  Donnelle Rubey 

## 2022-08-09 ENCOUNTER — Telehealth: Payer: Self-pay

## 2022-08-09 NOTE — Telephone Encounter (Signed)
Patients BMI is 60.94,  Please advise if she should be a direct or if you want an office visit. Pre visit is 1/22 and colonoscopy is scheduled for 2/5.  Last office visit was 02/23/2016 and last colonoscopy was 04/08/2016.

## 2022-08-09 NOTE — Telephone Encounter (Signed)
Procedure has been scheduled at the hospital on 08-29-2022 w/Dr Nandigam.

## 2022-08-10 NOTE — Telephone Encounter (Signed)
Got it, thanks!

## 2022-08-12 ENCOUNTER — Encounter: Payer: Managed Care, Other (non HMO) | Admitting: Gastroenterology

## 2022-08-12 NOTE — Telephone Encounter (Signed)
Scheduled at hospital on 08-29-22 w/Dr Nandigam.

## 2022-08-15 ENCOUNTER — Ambulatory Visit (AMBULATORY_SURGERY_CENTER): Payer: Managed Care, Other (non HMO)

## 2022-08-15 VITALS — Ht 66.0 in | Wt 341.0 lb

## 2022-08-15 DIAGNOSIS — Z86010 Personal history of colonic polyps: Secondary | ICD-10-CM

## 2022-08-15 MED ORDER — NA SULFATE-K SULFATE-MG SULF 17.5-3.13-1.6 GM/177ML PO SOLN: 1.0000 | Freq: Once | ORAL | 0 refills | Status: AC

## 2022-08-15 NOTE — Progress Notes (Signed)
No egg or soy allergy known to patient  No issues known to pt with past sedation with any surgeries or procedures Patient denies ever being told they had issues or difficulty with intubation  No FH of Malignant Hyperthermia Pt is not on diet pills Pt is not on  home 02  Pt is not on blood thinners  Pt denies issues with constipation  No A fib or A flutter Have any cardiac testing pending--no Pt instructed to use [email or web address] or GoodRx for a price reduction on prep   

## 2022-08-22 ENCOUNTER — Encounter (HOSPITAL_COMMUNITY): Payer: Self-pay | Admitting: Gastroenterology

## 2022-08-22 ENCOUNTER — Encounter: Payer: Self-pay | Admitting: Gastroenterology

## 2022-08-29 ENCOUNTER — Encounter (HOSPITAL_COMMUNITY)
Admission: RE | Disposition: A | Discharge status: Home / Self Care | Payer: Self-pay | Source: Home / Self Care | Attending: Gastroenterology

## 2022-08-29 ENCOUNTER — Ambulatory Visit (HOSPITAL_COMMUNITY)
Admission: RE | Admit: 2022-08-29 | Discharge: 2022-08-29 | Disposition: A | Discharge status: Home / Self Care | Payer: Managed Care, Other (non HMO) | Attending: Gastroenterology | Admitting: Gastroenterology

## 2022-08-29 ENCOUNTER — Ambulatory Visit (HOSPITAL_COMMUNITY): Payer: Managed Care, Other (non HMO) | Admitting: Certified Registered Nurse Anesthetist

## 2022-08-29 ENCOUNTER — Encounter (HOSPITAL_COMMUNITY): Payer: Self-pay | Admitting: Gastroenterology

## 2022-08-29 ENCOUNTER — Ambulatory Visit (HOSPITAL_BASED_OUTPATIENT_CLINIC_OR_DEPARTMENT_OTHER): Payer: Managed Care, Other (non HMO) | Admitting: Certified Registered Nurse Anesthetist

## 2022-08-29 DIAGNOSIS — F418 Other specified anxiety disorders: Secondary | ICD-10-CM | POA: Diagnosis not present

## 2022-08-29 DIAGNOSIS — Z6841 Body Mass Index (BMI) 40.0 and over, adult: Secondary | ICD-10-CM | POA: Diagnosis not present

## 2022-08-29 DIAGNOSIS — D123 Benign neoplasm of transverse colon: Secondary | ICD-10-CM | POA: Insufficient documentation

## 2022-08-29 DIAGNOSIS — Z86010 Personal history of colonic polyps: Secondary | ICD-10-CM

## 2022-08-29 DIAGNOSIS — Z09 Encounter for follow-up examination after completed treatment for conditions other than malignant neoplasm: Secondary | ICD-10-CM | POA: Insufficient documentation

## 2022-08-29 DIAGNOSIS — J449 Chronic obstructive pulmonary disease, unspecified: Secondary | ICD-10-CM | POA: Insufficient documentation

## 2022-08-29 DIAGNOSIS — K648 Other hemorrhoids: Secondary | ICD-10-CM | POA: Diagnosis not present

## 2022-08-29 DIAGNOSIS — K5730 Diverticulosis of large intestine without perforation or abscess without bleeding: Secondary | ICD-10-CM | POA: Insufficient documentation

## 2022-08-29 DIAGNOSIS — Z7985 Long-term (current) use of injectable non-insulin antidiabetic drugs: Secondary | ICD-10-CM | POA: Diagnosis not present

## 2022-08-29 DIAGNOSIS — D128 Benign neoplasm of rectum: Secondary | ICD-10-CM

## 2022-08-29 DIAGNOSIS — E119 Type 2 diabetes mellitus without complications: Secondary | ICD-10-CM

## 2022-08-29 DIAGNOSIS — D125 Benign neoplasm of sigmoid colon: Secondary | ICD-10-CM | POA: Diagnosis not present

## 2022-08-29 DIAGNOSIS — K635 Polyp of colon: Secondary | ICD-10-CM

## 2022-08-29 DIAGNOSIS — K621 Rectal polyp: Secondary | ICD-10-CM | POA: Diagnosis not present

## 2022-08-29 DIAGNOSIS — K219 Gastro-esophageal reflux disease without esophagitis: Secondary | ICD-10-CM | POA: Diagnosis not present

## 2022-08-29 DIAGNOSIS — Z1211 Encounter for screening for malignant neoplasm of colon: Secondary | ICD-10-CM | POA: Insufficient documentation

## 2022-08-29 DIAGNOSIS — I10 Essential (primary) hypertension: Secondary | ICD-10-CM | POA: Insufficient documentation

## 2022-08-29 DIAGNOSIS — Z87891 Personal history of nicotine dependence: Secondary | ICD-10-CM | POA: Insufficient documentation

## 2022-08-29 HISTORY — PX: POLYPECTOMY: SHX5525

## 2022-08-29 HISTORY — PX: COLONOSCOPY WITH PROPOFOL: SHX5780

## 2022-08-29 LAB — GLUCOSE, CAPILLARY: Glucose-Capillary: 124 mg/dL — ABNORMAL HIGH (ref 70–99)

## 2022-08-29 SURGERY — COLONOSCOPY WITH PROPOFOL
Anesthesia: Monitor Anesthesia Care

## 2022-08-29 MED ORDER — PROPOFOL 500 MG/50ML IV EMUL
INTRAVENOUS | Status: AC
  Filled 2022-08-29: qty 50

## 2022-08-29 MED ORDER — PROPOFOL 10 MG/ML IV BOLUS
INTRAVENOUS | Status: DC | PRN
  Administered 2022-08-29: 30 mg via INTRAVENOUS

## 2022-08-29 MED ORDER — PROPOFOL 10 MG/ML IV BOLUS
INTRAVENOUS | Status: AC
  Filled 2022-08-29: qty 20

## 2022-08-29 MED ORDER — LACTATED RINGERS IV SOLN
INTRAVENOUS | Status: DC
  Administered 2022-08-29: 1000 mL via INTRAVENOUS

## 2022-08-29 MED ORDER — SODIUM CHLORIDE 0.9 % IV SOLN: INTRAVENOUS | Status: DC

## 2022-08-29 MED ORDER — LIDOCAINE 2% (20 MG/ML) 5 ML SYRINGE
INTRAMUSCULAR | Status: DC | PRN
  Administered 2022-08-29: 60 mg via INTRAVENOUS

## 2022-08-29 MED ORDER — PROPOFOL 500 MG/50ML IV EMUL
INTRAVENOUS | Status: DC | PRN
  Administered 2022-08-29: 100 ug/kg/min via INTRAVENOUS

## 2022-08-29 SURGICAL SUPPLY — 22 items

## 2022-08-29 NOTE — H&P (Signed)
[facility] History and Physical   Primary Care Physician:  Windell Hummingbird, PA-C   Reason for Procedure:   History of colon polyps  Plan:    Surveillance colonoscopy with possible interventions     HPI: [name] is a 57 y.o. female with morbid obesity BMI 60 and multiple co-morbidities here for surveillance colonoscopy. The risks and benefits as well as alternatives of endoscopic procedure(s) have been discussed and reviewed. All questions answered. The patient agrees to proceed.    Past Medical History:  Diagnosis Date   Allergy    Arthritis    knees   Chronic headaches    Depression    Diabetes ([facility])    GERD (gastroesophageal reflux disease)    Herpes zoster complicated 12/2374   affecting right eye with blured vision   Hypertension    Nicotine addiction    Obesity    Sinusitis     Past Surgical History:  Procedure Laterality Date   CHOLECYSTECTOMY  1995   laparoscopic   COLONOSCOPY WITH PROPOFOL N/A 04/08/2016   Procedure: COLONOSCOPY WITH PROPOFOL;  Surgeon: Mauri Pole, MD;  Location: [facility];  Service: Endoscopy;  Laterality: N/A;   [place] CYST  2004   TONSILLECTOMY     child   TUBAL LIGATION  1994    Prior to Admission medications   Medication Sig Start Date End Date Taking? Authorizing Provider  albuterol (PROAIR HFA) 108 (90 Base) MCG/ACT inhaler INHALE TWO PUFFS INTO LUNGS EVERY 6 HOURS AS NEEDED FOR WHEEZING OR  SHORTNESS  OF  BREATH 06/20/18  Yes Lauraine Rinne, NP  amLODipine (NORVASC) 10 MG tablet Take 10 mg by mouth daily.   Yes [provider]  cloNIDine (CATAPRES) 0.2 MG tablet Take 0.2 mg by mouth daily. 08/21/22  Yes [provider]  diclofenac (VOLTAREN) 75 MG EC tablet Take 75 mg by mouth 2 (two) times daily.   Yes [provider]  fluticasone (FLONASE) 50 MCG/ACT nasal spray USE TWO SPRAY(S) IN EACH NOSTRIL ONCE DAILY Patient taking differently: Place 2  sprays into both nostrils daily as needed for allergies. 02/01/16  Yes Fayrene Helper, MD  furosemide (LASIX) 40 MG tablet Take 40 mg by mouth daily. 04/26/18  Yes [provider]  hydrochlorothiazide (HYDRODIURIL) 12.5 MG tablet Take 12.5 mg by mouth daily.  06/24/17  Yes [provider]  lisdexamfetamine (VYVANSE) 10 MG capsule Take 10 mg by mouth daily.   Yes [provider]  loratadine (CLARITIN) 10 MG tablet Take 1 tablet (10 mg total) by mouth daily. Patient taking differently: Take 10 mg by mouth daily as needed for allergies. 10/21/15  Yes Fayrene Helper, MD  MELATONIN PO Take 1 tablet by mouth at bedtime as needed (sleep).   Yes [provider]  SEMGLEE, YFGN, 100 UNIT/ML Pen Inject 15 Units into the skin daily. 08/11/22  Yes [provider]  sertraline (ZOLOFT) 50 MG tablet Take 50 mg by mouth daily. 08/06/22  Yes [provider]  tirzepatide Darcel Bayley) 2.5 MG/0.5ML Pen Inject 2.5 mg into the skin every Sunday.   Yes [provider]  traMADol (ULTRAM) 50 MG tablet Take 50 mg by mouth every 6 (six) hours as needed for moderate pain. 06/06/22  Yes [provider]  Undecylenic Ac-Zn Undecylenate (FUNGI-NAIL TOE & FOOT EX) Apply 1 spray topically daily as needed (foot fungus).   Yes [provider]  Vitamin D, Ergocalciferol, (DRISDOL) 1.25 MG (50000 UNIT) CAPS  capsule Take 50,000 Units by mouth every Saturday. 06/02/22  Yes [provider]    No current facility-administered medications for this encounter.    Allergies as of 07/05/2022 - Review Complete 06/20/2018  Allergen Reaction Noted   Wellbutrin [bupropion] Shortness Of Breath 02/03/2015   Ibuprofen Hives 09/22/2008   Statins Other (See Comments) 07/22/2013    Family History  Problem Relation Age of Onset   Diabetes Mother    Hypertension Mother    Stroke Mother    Rheum arthritis Mother    Diabetes Father    Hypertension Father     Rheum arthritis Father    Hypertension Sister    Heart disease Sister    Colon polyps Brother    Hypertension Brother    Throat cancer Maternal Uncle    Colon cancer Maternal Grandfather 32   Esophageal cancer Neg Hx    Rectal cancer Neg Hx    Stomach cancer Neg Hx     Social History   Socioeconomic History   Marital status: Married    Spouse name: Not on file   Number of children: 2   Years of education: Not on file   Highest education level: Not on file  Occupational History   Occupation: Driver  Tobacco Use   Smoking status: Former    Packs/day: 1.50    Years: 30.00    Total pack years: 45.00    Types: Cigarettes    Quit date: 03/05/2014    Years since quitting: 8.4   Smokeless tobacco: Never  Vaping Use   Vaping Use: Every day  Substance and Sexual Activity   Alcohol use: No    Alcohol/week: 0.0 standard drinks of alcohol   Drug use: No   Sexual activity: Yes  Other Topics Concern   Not on file  Social History Narrative   Not on file   Social Determinants of Health   Financial Resource Strain: Not on file  Food Insecurity: Not on file  Transportation Needs: Not on file  Physical Activity: Not on file  Stress: Not on file  Social Connections: Not on file  Intimate Partner Violence: Not on file    Review of Systems:  All other review of systems negative except as mentioned in the HPI.  Physical Exam: Vital signs in last 24 hours: Temp:  [97.7 F (36.5 C)] 97.7 F (36.5 C) (02/05 0635) Pulse Rate:  [94] 94 (02/05 0635) Resp:  [20] 20 (02/05 0635) BP: (125)/(80) 125/80 (02/05 0635) SpO2:  [96 %] 96 % (02/05 0635) Weight:  [154.7 kg] 154.7 kg (02/05 0635)   General:   Alert, NAD Lungs:  Clear .   Heart:  Regular rate and rhythm Abdomen:  Soft, nontender and nondistended. Neuro/Psych:  Alert and cooperative. Normal mood and affect. A and O x 3   K. Denzil Magnuson , MD [phone]

## 2022-08-29 NOTE — Anesthesia Postprocedure Evaluation (Signed)
Anesthesia Post Note  Patient: [name]  Procedure(s) Performed: COLONOSCOPY WITH PROPOFOL POLYPECTOMY     Patient location during evaluation: PACU Anesthesia Type: MAC Level of consciousness: awake and alert Pain management: pain level controlled Vital Signs Assessment: post-procedure vital signs reviewed and stable Respiratory status: spontaneous breathing, nonlabored ventilation and respiratory function stable Cardiovascular status: blood pressure returned to baseline and stable Postop Assessment: no apparent nausea or vomiting Anesthetic complications: no   No notable events documented.  Last Vitals:  Vitals:   08/29/22 0840 08/29/22 0847  BP: (!) 137/99 (!) 137/99  Pulse: 80 78  Resp: 16 (!) 23  Temp:    SpO2: 96% 95%    Last Pain:  Vitals:   08/29/22 0825  TempSrc: Temporal  PainSc: 0-No pain                 Lynda Rainwater

## 2022-08-29 NOTE — Discharge Instructions (Addendum)

## 2022-08-29 NOTE — Anesthesia Preprocedure Evaluation (Signed)
Anesthesia Evaluation  Patient identified by MRN, date of birth, ID band Patient awake    Reviewed: Allergy & Precautions, NPO status , Patient's Chart, lab work & pertinent test results  History of Anesthesia Complications Negative for: history of anesthetic complications  Airway Mallampati: II  TM Distance: >3 FB Neck ROM: Full    Dental no notable dental hx. (+) Dental Advisory Given   Pulmonary asthma , COPD, former smoker   Pulmonary exam normal breath sounds clear to auscultation       Cardiovascular hypertension, Pt. on medications + DOE  Normal cardiovascular exam Rhythm:Regular Rate:Normal     Neuro/Psych  Headaches PSYCHIATRIC DISORDERS Anxiety Depression       GI/Hepatic Neg liver ROS,GERD  Controlled,,  Endo/Other  diabetes, Type 2  Morbid obesity  Renal/GU negative Renal ROS  negative genitourinary   Musculoskeletal  (+) Arthritis , Osteoarthritis,    Abdominal  (+) + obese  Peds negative pediatric ROS (+)  Hematology negative hematology ROS (+)   Anesthesia Other Findings   Reproductive/Obstetrics negative OB ROS                             Anesthesia Physical Anesthesia Plan  ASA: III  Anesthesia Plan: MAC   Post-op Pain Management: Minimal or no pain anticipated   Induction: Intravenous  PONV Risk Score and Plan: 2 and Ondansetron, Midazolam and Treatment may vary due to age or medical condition  Airway Management Planned: Simple Face Mask  Additional Equipment:   Intra-op Plan:   Post-operative Plan:   Informed Consent: I have reviewed the patients History and Physical, chart, labs and discussed the procedure including the risks, benefits and alternatives for the proposed anesthesia with the patient or authorized representative who has indicated his/her understanding and acceptance.     Dental advisory given  Plan Discussed with: CRNA  Anesthesia  Plan Comments:         Anesthesia Quick Evaluation

## 2022-08-29 NOTE — Op Note (Signed)
[facility] Patient Name: [name] Procedure Date: 08/29/2022 MRN: [id number] Attending MD: Mauri Pole , MD, [id number] Date of Birth: [birth year] CSN: [id number] Age: 57 Admit Type: Outpatient Procedure:                Colonoscopy Indications:              High risk colon cancer surveillance: Personal                            history of colonic polyps, High risk colon cancer                            surveillance: Personal history of multiple (3 or                            more) adenomas Providers:                Mauri Pole, MD, Helane Rima, RN,                            William Dalton, Technician Referring MD:              Medicines:                Monitored Anesthesia Care Complications:            No immediate complications. Estimated Blood Loss:     Estimated blood loss was minimal. Procedure:                Pre-Anesthesia Assessment:                           - Prior to the procedure, a History and Physical                            was performed, and patient medications and                            allergies were reviewed. The patient's tolerance of                            previous anesthesia was also reviewed. The risks                            and benefits of the procedure and the sedation                            options and risks were discussed with the patient.                            All questions were answered, and informed consent                            was obtained. Prior Anticoagulants: The patient has  taken no anticoagulant or antiplatelet agents. ASA                            Grade Assessment: III - A patient with severe                            systemic disease. After reviewing the risks and                            benefits, the patient was deemed in satisfactory                            condition to undergo the procedure.                           After obtaining  informed consent, the colonoscope                            was passed under direct vision. Throughout the                            procedure, the patient's blood pressure, pulse, and                            oxygen saturations were monitored continuously. The                            [id number]-HQ190L ([id number]) Olympus colonoscope was                            introduced through the anus and advanced to the the                            cecum, identified by appendiceal orifice and                            ileocecal valve. The colonoscopy was performed                            without difficulty. The patient tolerated the                            procedure well. The quality of the bowel                            preparation was good. The ileocecal valve,                            appendiceal orifice, and rectum were photographed. Scope In: 7:55:02 AM Scope Out: 8:16:22 AM Scope Withdrawal Time: 0 hours 18 minutes 28 seconds  Total Procedure Duration: 0 hours 21 minutes 20 seconds  Findings:      The perianal and digital rectal examinations were normal.      Five sessile polyps were found in the rectum, sigmoid colon  and       transverse colon. The polyps were 4 to 9 mm in size. These polyps were       removed with a cold snare. Resection and retrieval were complete.      Scattered small-mouthed diverticula were found in the sigmoid colon and       descending colon.      Non-bleeding external and internal hemorrhoids were found during       retroflexion. The hemorrhoids were medium-sized. Impression:               - Five 4 to 9 mm polyps in the rectum, in the                            sigmoid colon and in the transverse colon, removed                            with a cold snare. Resected and retrieved.                           - Diverticulosis in the sigmoid colon and in the                            descending colon.                           - Non-bleeding external and  internal hemorrhoids. Moderate Sedation:      N/A Recommendation:           - Patient has a contact number available for                            emergencies. The signs and symptoms of potential                            delayed complications were discussed with the                            patient. Return to normal activities tomorrow.                            Written discharge instructions were provided to the                            patient.                           - Resume previous diet.                           - Continue present medications.                           - Await pathology results.                           - Repeat colonoscopy in 3 - 5 years for  surveillance based on pathology results. Procedure Code(s):        --- Professional ---                           [id number], Colonoscopy, flexible; with removal of                            tumor(s), polyp(s), or other lesion(s) by snare                            technique Diagnosis Code(s):        --- Professional ---                           D12.8, Benign neoplasm of rectum                           D12.5, Benign neoplasm of sigmoid colon                           D12.3, Benign neoplasm of transverse colon (hepatic                            flexure or splenic flexure)                           K64.8, Other hemorrhoids                           Z86.010, Personal history of colonic polyps                           K57.30, Diverticulosis of large intestine without                            perforation or abscess without bleeding CPT copyright 2022 [employer]. All rights reserved. The codes documented in this report are preliminary and upon coder review may  be revised to meet current compliance requirements. Mauri Pole, MD 08/29/2022 8:24:05 AM This report has been signed electronically. Number of Addenda: 0

## 2022-08-29 NOTE — Transfer of Care (Signed)
Immediate Anesthesia Transfer of Care Note  Patient: [name]  Procedure(s) Performed: COLONOSCOPY WITH PROPOFOL POLYPECTOMY  Patient Location: Endoscopy Unit  Anesthesia Type:MAC  Level of Consciousness: awake, alert , and oriented  Airway & Oxygen Therapy: Patient Spontanous Breathing and Patient connected to face mask oxygen  Post-op Assessment: Report given to RN and Post -op Vital signs reviewed and stable  Post vital signs: Reviewed and stable  Last Vitals:  Vitals Value Taken Time  BP 121/100   Temp    Pulse 88   Resp 14   SpO2 99%     Last Pain:  Vitals:   08/29/22 0635  TempSrc: Tympanic  PainSc: 0-No pain         Complications: No notable events documented.

## 2022-08-30 LAB — SURGICAL PATHOLOGY

## 2022-09-02 ENCOUNTER — Encounter (HOSPITAL_COMMUNITY): Payer: Self-pay | Admitting: Gastroenterology

## 2022-10-18 ENCOUNTER — Encounter: Payer: Self-pay | Admitting: Gastroenterology

## 2022-10-21 ENCOUNTER — Other Ambulatory Visit (HOSPITAL_COMMUNITY): Payer: Self-pay

## 2022-10-21 MED ORDER — MOUNJARO 7.5 MG/0.5ML ~~LOC~~ SOAJ
7.5000 mg | SUBCUTANEOUS | 5 refills | Status: AC
  Filled 2022-10-21: qty 2, 28d supply, fill #0
  Filled 2022-11-16: qty 2, 28d supply, fill #1

## 2022-10-24 ENCOUNTER — Other Ambulatory Visit (HOSPITAL_COMMUNITY): Payer: Self-pay

## 2022-10-24 MED ORDER — MOUNJARO 7.5 MG/0.5ML ~~LOC~~ SOAJ
7.5000 mg | SUBCUTANEOUS | 0 refills | Status: AC
  Filled 2022-10-24 – 2022-12-23 (×3): qty 2, 28d supply, fill #0

## 2022-10-29 ENCOUNTER — Other Ambulatory Visit (HOSPITAL_COMMUNITY): Payer: Self-pay

## 2022-11-16 ENCOUNTER — Other Ambulatory Visit (HOSPITAL_COMMUNITY): Payer: Self-pay

## 2022-12-20 ENCOUNTER — Other Ambulatory Visit (HOSPITAL_COMMUNITY): Payer: Self-pay

## 2022-12-23 ENCOUNTER — Other Ambulatory Visit (HOSPITAL_COMMUNITY): Payer: Self-pay

## 2022-12-26 ENCOUNTER — Other Ambulatory Visit (HOSPITAL_COMMUNITY): Payer: Self-pay

## 2024-05-06 ENCOUNTER — Other Ambulatory Visit: Payer: Self-pay

## 2024-05-06 ENCOUNTER — Emergency Department (HOSPITAL_COMMUNITY)

## 2024-05-06 ENCOUNTER — Emergency Department (HOSPITAL_COMMUNITY)
Admission: EM | Admit: 2024-05-06 | Discharge: 2024-05-06 | Disposition: A | Discharge status: Home / Self Care | Source: Ambulatory Visit | Attending: Emergency Medicine | Admitting: Emergency Medicine

## 2024-05-06 DIAGNOSIS — R109 Unspecified abdominal pain: Secondary | ICD-10-CM | POA: Diagnosis present

## 2024-05-06 DIAGNOSIS — R079 Chest pain, unspecified: Secondary | ICD-10-CM | POA: Insufficient documentation

## 2024-05-06 DIAGNOSIS — M7918 Myalgia, other site: Secondary | ICD-10-CM | POA: Insufficient documentation

## 2024-05-06 DIAGNOSIS — M791 Myalgia, unspecified site: Secondary | ICD-10-CM

## 2024-05-06 DIAGNOSIS — R10A2 Flank pain, left side: Secondary | ICD-10-CM

## 2024-05-06 LAB — CBC
HCT: 41.4 % (ref 36.0–46.0)
Hemoglobin: 12.4 g/dL (ref 12.0–15.0)
MCH: 22 pg — ABNORMAL LOW (ref 26.0–34.0)
MCHC: 30 g/dL (ref 30.0–36.0)
MCV: 73.4 fL — ABNORMAL LOW (ref 80.0–100.0)
Platelets: 317 K/uL (ref 150–400)
RBC: 5.64 MIL/uL — ABNORMAL HIGH (ref 3.87–5.11)
RDW: 15.8 % — ABNORMAL HIGH (ref 11.5–15.5)
WBC: 7.8 K/uL (ref 4.0–10.5)
nRBC: 0 % (ref 0.0–0.2)

## 2024-05-06 LAB — URINALYSIS, ROUTINE W REFLEX MICROSCOPIC
Bilirubin Urine: NEGATIVE
Glucose, UA: NEGATIVE mg/dL
Hgb urine dipstick: NEGATIVE
Ketones, ur: NEGATIVE mg/dL
Leukocytes,Ua: NEGATIVE
Nitrite: NEGATIVE
Protein, ur: NEGATIVE mg/dL
Specific Gravity, Urine: 1.004 — ABNORMAL LOW (ref 1.005–1.030)
pH: 7 (ref 5.0–8.0)

## 2024-05-06 LAB — BASIC METABOLIC PANEL WITH GFR
Anion gap: 9 (ref 5–15)
BUN: 10 mg/dL (ref 6–20)
CO2: 29 mmol/L (ref 22–32)
Calcium: 9.2 mg/dL (ref 8.9–10.3)
Chloride: 98 mmol/L (ref 98–111)
Creatinine, Ser: 0.61 mg/dL (ref 0.44–1.00)
GFR, Estimated: >60 mL/min (ref >60)
Glucose, Bld: 146 mg/dL — ABNORMAL HIGH (ref 70–99)
Potassium: 4.8 mmol/L (ref 3.5–5.1)
Sodium: 136 mmol/L (ref 135–145)

## 2024-05-06 LAB — TROPONIN T, HIGH SENSITIVITY
Troponin T High Sensitivity: <15 ng/L (ref 0–19)
Troponin T High Sensitivity: <15 ng/L (ref 0–19)

## 2024-05-06 MED ORDER — IBUPROFEN 800 MG PO TABS: 800.0000 mg | ORAL_TABLET | Freq: Three times a day (TID) | ORAL | 0 refills | Status: AC

## 2024-05-06 MED ORDER — MORPHINE SULFATE (PF) 2 MG/ML IV SOLN
2.0000 mg | Freq: Once | INTRAVENOUS | Status: AC
  Administered 2024-05-06: 2 mg via INTRAVENOUS
  Filled 2024-05-06: qty 1

## 2024-05-06 MED ORDER — METHOCARBAMOL 500 MG PO TABS: 500.0000 mg | ORAL_TABLET | Freq: Two times a day (BID) | ORAL | 0 refills | Status: AC

## 2024-05-06 MED ORDER — METHOCARBAMOL 500 MG PO TABS
500.0000 mg | ORAL_TABLET | Freq: Once | ORAL | Status: AC
Start: 2024-05-06 — End: 2024-05-06
  Administered 2024-05-06: 500 mg via ORAL
  Filled 2024-05-06: qty 1

## 2024-05-06 MED ORDER — KETOROLAC TROMETHAMINE 15 MG/ML IJ SOLN
15.0000 mg | Freq: Once | INTRAMUSCULAR | Status: AC
  Administered 2024-05-06: 15 mg via INTRAVENOUS
  Filled 2024-05-06: qty 1

## 2024-05-06 NOTE — Discharge Instructions (Addendum)
 Imaging and lab work were reassuring today.  Its advised to follow-up with your OB/GYN for further evaluation of fibroid and cyst.  I have given you a short course of ibuprofen and muscle relaxer, Robaxin.  Use these to help with muscle pain in your back.  It is advised to follow-up with primary care for long-term management.  If you have concerning symptoms or symptoms of chest pain or shortness of breath.  Return to ED for further evaluation.

## 2024-05-06 NOTE — ED Provider Notes (Signed)
 I provided a substantive portion of the care of this patient.  I personally made/approved the management plan for this patient and take responsibility for the patient management.  EKG Interpretation Date/Time:  Monday May 06 2024 08:08:53 EDT Ventricular Rate:  87 PR Interval:  270 QRS Duration:  145 QT Interval:  384 QTC Calculation: 462 R Axis:   244  Text Interpretation: Sinus rhythm Prolonged PR interval RBBB and LAFB Confirmed by Dasie Faden ([id number]) on 05/06/2024 3:06:16 PM   Patient's EKG shows normal sinus rhythm.  Patient here with left lower abdominal pain going to her chest times several weeks.  Seen by her doctor for same.  Workup here showed no acute findings.  Feels better after muscle relaxants will be discharged home   Dasie Faden, MD 05/06/24 1507

## 2024-05-06 NOTE — ED Triage Notes (Signed)
 Pt reports lower left abdominal pain radiating up to chest. Going on x2 weeks but gradually getting worse. Get steroids, tramadol, tylenol  3, and lidocaine  patches from primary dr with no relief. Denies [facility], denies NVD

## 2024-05-06 NOTE — ED Provider Notes (Signed)
 [place] EMERGENCY DEPARTMENT AT [facility] Provider Note   CSN: [id number] Arrival date & time: 05/06/24  9198     Patient presents with: Chest Pain and Flank Pain   [name] is a 58 y.o. female.  58 year old female presents to the ED with complaints of left flank pain x2 weeks.  Patient was seen at her primary care on Thursday and given prednisone  for suspected musculoskeletal pain.  Patient advises at 12 AM this morning she experienced intermittent cramping that radiated up to her chest and exacerbated with some movements.  Patient denies any associated diaphoresis, SHOB, nausea, fevers, urinary symptoms, or vomiting.     Prior to Admission medications   Medication Sig Start Date End Date Taking? Authorizing Provider  ibuprofen (ADVIL) 800 MG tablet Take 1 tablet (800 mg total) by mouth 3 (three) times daily. 05/06/24  Yes Myriam Fonda RAMAN, PA-C  methocarbamol (ROBAXIN) 500 MG tablet Take 1 tablet (500 mg total) by mouth 2 (two) times daily. 05/06/24  Yes Myriam Fonda RAMAN, PA-C  albuterol  (PROAIR  HFA) 108 (90 Base) MCG/ACT inhaler INHALE TWO PUFFS INTO LUNGS EVERY 6 HOURS AS NEEDED FOR WHEEZING OR  SHORTNESS  OF  BREATH 06/20/18   Tonna Redell SQUIBB, NP  amLODipine (NORVASC) 10 MG tablet Take 10 mg by mouth daily.    [provider]  cloNIDine (CATAPRES) 0.2 MG tablet Take 0.2 mg by mouth daily. 08/21/22   [provider]  fluticasone  (FLONASE ) 50 MCG/ACT nasal spray USE TWO SPRAY(S) IN EACH NOSTRIL ONCE DAILY Patient taking differently: Place 2 sprays into both nostrils daily as needed for allergies. 02/01/16   Antonetta Rollene BRAVO, MD  furosemide  (LASIX ) 40 MG tablet Take 40 mg by mouth daily. 04/26/18   [provider]  hydrochlorothiazide (HYDRODIURIL) 12.5 MG tablet Take 12.5 mg by mouth daily.  06/24/17   [provider]  lisdexamfetamine (VYVANSE) 10 MG capsule Take 10 mg by mouth daily.    [provider]  loratadine   (CLARITIN ) 10 MG tablet Take 1 tablet (10 mg total) by mouth daily. Patient taking differently: Take 10 mg by mouth daily as needed for allergies. 10/21/15   Antonetta Rollene BRAVO, MD  MELATONIN PO Take 1 tablet by mouth at bedtime as needed (sleep).    [provider]  SEMGLEE, YFGN, 100 UNIT/ML Pen Inject 15 Units into the skin daily. 08/11/22   [provider]  sertraline (ZOLOFT) 50 MG tablet Take 50 mg by mouth daily. 08/06/22   [provider]  tirzepatide  (MOUNJARO ) 2.5 MG/0.5ML Pen Inject 2.5 mg into the skin every Sunday.    [provider]  tirzepatide  (MOUNJARO ) 7.5 MG/0.5ML Pen Inject 7.5 mg into the skin once a week. 10/11/22     tirzepatide  (MOUNJARO ) 7.5 MG/0.5ML Pen Inject 7.5 mg into the skin once a week. 10/11/22     traMADol (ULTRAM) 50 MG tablet Take 50 mg by mouth every 6 (six) hours as needed for moderate pain. 06/06/22   [provider]  Undecylenic Ac-Zn Undecylenate (FUNGI-NAIL TOE & FOOT EX) Apply 1 spray topically daily as needed (foot fungus).    [provider]  Vitamin D , Ergocalciferol , (DRISDOL) 1.25 MG (50000 UNIT) CAPS capsule Take 50,000 Units by mouth every Saturday. 06/02/22   [provider]    Allergies: Statins, Wellbutrin  [bupropion ], Ibuprofen, and Tape    Review of Systems  Cardiovascular:  Positive for chest pain.  Genitourinary:  Positive for flank pain.  All other systems  reviewed and are negative.   Updated Vital Signs BP 124/81   Pulse 84   Temp 98 F (36.7 C) (Oral)   Resp 14   Ht 5' 6 (1.676 m)   Wt (!) 140.6 kg   SpO2 95%   BMI 50.04 kg/m   Physical Exam Vitals and nursing note reviewed.  Constitutional:      Appearance: Normal appearance. She is well-developed.  HENT:     Head: Normocephalic and atraumatic.     Nose: Nose normal.  Eyes:     Extraocular Movements: Extraocular movements intact.     Conjunctiva/sclera: Conjunctivae normal.     Pupils: Pupils are equal,  round, and reactive to light.  Cardiovascular:     Rate and Rhythm: Normal rate and regular rhythm.  Pulmonary:     Effort: Pulmonary effort is normal. No respiratory distress.     Breath sounds: Normal breath sounds.  Abdominal:     Palpations: Abdomen is soft.     Tenderness: There is no abdominal tenderness. There is no guarding.  Musculoskeletal:        General: Normal range of motion.     Cervical back: Normal range of motion.     Right lower leg: No tenderness.     Left lower leg: No tenderness.  Skin:    General: Skin is warm.     Capillary Refill: Capillary refill takes less than 2 seconds.  Neurological:     General: No focal deficit present.     Mental Status: She is alert.  Psychiatric:        Mood and Affect: Mood normal.        Behavior: Behavior normal.     (all labs ordered are listed, but only abnormal results are displayed) Labs Reviewed  BASIC METABOLIC PANEL WITH GFR - Abnormal; Notable for the following components:      Result Value   Glucose, Bld 146 (*)    All other components within normal limits  CBC - Abnormal; Notable for the following components:   RBC 5.64 (*)    MCV 73.4 (*)    MCH 22.0 (*)    RDW 15.8 (*)    All other components within normal limits  URINALYSIS, ROUTINE W REFLEX MICROSCOPIC - Abnormal; Notable for the following components:   Color, Urine STRAW (*)    Specific Gravity, Urine 1.004 (*)    All other components within normal limits  TROPONIN T, HIGH SENSITIVITY  TROPONIN T, HIGH SENSITIVITY    EKG: EKG Interpretation Date/Time:  Monday May 06 2024 08:08:53 EDT Ventricular Rate:  87 PR Interval:  270 QRS Duration:  145 QT Interval:  384 QTC Calculation: 462 R Axis:   244  Text Interpretation: Sinus rhythm Prolonged PR interval RBBB and LAFB Confirmed by Dasie Faden ([id number]) on 05/06/2024 3:06:16 PM  Radiology: CT Renal Stone Study Result Date: 05/06/2024 CLINICAL DATA:  Left lower quadrant abdominal pain  radiating up to the chest for the past 2 weeks, gradually worsening. EXAM: CT ABDOMEN AND PELVIS WITHOUT CONTRAST TECHNIQUE: Multidetector CT imaging of the abdomen and pelvis was performed following the standard protocol without IV contrast. RADIATION DOSE REDUCTION: This exam was performed according to the departmental dose-optimization program which includes automated exposure control, adjustment of the mA and/or kV according to patient size and/or use of iterative reconstruction technique. COMPARISON:  Abdomen and pelvis CT dated 01/12/2005. Chest CT dated 08/21/2017. Pelvic ultrasound dated 12/28/2004. FINDINGS: Lower chest: Enlarged central pulmonary arteries with a  main pulmonary artery diameter of 3.6 cm. Normal-sized heart. Minimal bibasilar atelectasis. Small left lower lobe calcified granuloma. Previously demonstrated bilateral small noncalcified lung nodules appear smaller and less dense, compatible with a benign process not requiring imaging follow-up. Hepatobiliary: No focal liver abnormality is seen. Status post cholecystectomy. No biliary dilatation. Pancreas: Unremarkable. No pancreatic ductal dilatation or surrounding inflammatory changes. Spleen: Normal in size without focal abnormality. Adrenals/Urinary Tract: Adrenal glands are unremarkable. Kidneys are normal, without renal calculi, focal lesion, or hydronephrosis. Bladder is unremarkable. Stomach/Bowel: Multiple sigmoid colon diverticula without evidence of diverticulitis. Small amount of high density material in the distal appendix with no evidence of appendicitis. Unremarkable stomach and small bowel. Vascular/Lymphatic: No significant vascular findings are present. No enlarged abdominal or pelvic lymph nodes. Reproductive: The previously demonstrated multi-cystic and minimally calcified right ovarian mass has more solid-appearing components than cystic components today with significant progression of scattered coarse calcifications without  significant change in size. This continues to abut the uterus and measures 5.1 x 3.8 cm on image number 82/2, 5.0 x 3.4 x 3.2 cm on the 12/28/2004 pelvic ultrasound. There is an exophytic posterior uterine fundal fibroid on the left measuring 2.2 cm on coronal image number 120/6. No left adnexal mass. Other: No abdominal wall hernia or abnormality. No abdominopelvic ascites. Musculoskeletal: Lumbar and lower thoracic spine degenerative changes. Mild left and minimal right hip degenerative changes. IMPRESSION: 1. No acute abnormality. 2. Sigmoid diverticulosis without evidence of diverticulitis. 3. 5.1 x 3.8 cm right ovarian mass with more solid-appearing components than cystic components today with significant progression of scattered coarse calcifications and without significant change in size since 2006. This is compatible with a benign process, not needing imaging follow-up. 4. 2.2 cm exophytic posterior uterine fundal fibroid. 5. Enlarged central pulmonary arteries, compatible with pulmonary arterial hypertension. Electronically Signed   By: Elspeth Bathe M.D.   On: 05/06/2024 10:14   DG Chest 2 View Result Date: 05/06/2024 CLINICAL DATA:  Left-sided chest pain. EXAM: CHEST - 2 VIEW COMPARISON:  05/29/2017 FINDINGS: Low lung volumes. The lungs are clear without focal pneumonia, edema, pneumothorax or pleural effusion. Cardiopericardial silhouette is at upper limits of normal for size. No acute bony abnormality. Telemetry leads overlie the chest. IMPRESSION: Low volume film without acute cardiopulmonary findings. Electronically Signed   By: Camellia Candle M.D.   On: 05/06/2024 08:50    Procedures   Medications Ordered in the ED  morphine (PF) 2 MG/ML injection 2 mg (2 mg Intravenous Given 05/06/24 0903)  methocarbamol (ROBAXIN) tablet 500 mg (500 mg Oral Given 05/06/24 1203)  ketorolac (TORADOL) 15 MG/ML injection 15 mg (15 mg Intravenous Given 05/06/24 1400)   58 y.o. female presents to the ED with  complaints of left flank plain radiating to the axilla., this involves an extensive number of treatment options, and is a complaint that carries with it a high risk of complications and morbidity.  The differential diagnosis includes ACS, nephrolithiasis, hydronephrosis, pyelonephritis, UTI, musculoskeletal pain (Ddx)  On arrival pt is nontoxic, vitals unremarkable. Exam significant for left flank pain to palpation  I ordered medication morphine Robaxin and Toradol for pain  Lab Tests:  I Ordered, reviewed, and interpreted labs, which included: BMP, CBC, UA, troponin  Imaging Studies ordered:  I ordered imaging studies which included CT renal chest x-ray, I independently visualized and interpreted imaging which showed findings of ovarian cyst and uterine fibroid.  Findings were discussed with patient and advised to follow-up with OB/GYN  ED Course:  58 year old woman presents to ED with complaints of left flank pain for 2 weeks.  See above for full HPI.  On initial exam patient is laying uncomfortably in ED bed in no acute distress and nontoxic-appearing.  Patient has palpable pain to the left flank.  Patient denies any urinary symptoms.  Patient denies any abdominal pain and there is no pain to palpation throughout abdomen.  Lungs are clear to auscultation all fields and patient denies any shortness of breath.  Patient advised today she had some radiation up close to her chest.  Patient does not complain of any chest pain currently and advises left flank pain.  Patient CT renal was negative for intrarenal etiologies.  Incidental finding of ovarian mass and uterine fibroid was noted.  Patient was advised to follow-up with OB/GYN.  She has established OB/GYN.  Patient reports relief of symptoms with Robaxin.  Patient has reported allergy of hives with ibuprofen.  Patient advises that she takes ibuprofen at home regularly without any side effects.  With negative troponin and negative CT renal study  this is most likely musculoskeletal pain due to relief of symptoms with Robaxin and pain to palpation in the left lower flank.  Patient also has increased pain with movement.  These findings were discussed with the patient and she requested prescription of ibuprofen and Robaxin.  Patient was advised that we would provide a short course and she would need to follow-up with primary care for further management.  Patient was given return precautions and agreed with treatment plan and was comfortable discharge.  Portions of this note were generated with Scientist, clinical (histocompatibility and immunogenetics). Dictation errors may occur despite best attempts at proofreading.    Final diagnoses:  Left flank pain  Pain on movement of skeletal muscle    ED Discharge Orders          Ordered    methocarbamol (ROBAXIN) 500 MG tablet  2 times daily        05/06/24 1354    ibuprofen (ADVIL) 800 MG tablet  3 times daily        05/06/24 1354               Myriam Fonda GORMAN DEVONNA 05/06/24 1537    Dasie Faden, MD 05/13/24 [phone]

## 2024-05-06 NOTE — ED Notes (Signed)
 Patient informed that urine sample is needed. Toileting offered. Patient states unable to void at this time

## 2024-05-27 NOTE — Progress Notes (Unsigned)
 GYNECOLOGY OFFICE VISIT NOTE  History:  [name] is a 58 y.o. No obstetric history on file. here today for follow up from CT for a right ovarian cyst.   Her CT was done in the ED for CP and left flank pain.   I reviewed the report and notably: - 5.1 x 3.8 cm right ovarian mass with more solid-appearing components than cystic components today with significant progression of scattered coarse calcifications and without significant change in size since 2006. This is compatible with a benign process, not needing imaging follow-up. - 2.2 cm exophytic posterior uterine fundal fibroid.       Past Medical History:  Diagnosis Date   Allergy    Arthritis    knees   Chronic headaches    Depression    Diabetes (HCC)    GERD (gastroesophageal reflux disease)    Herpes zoster complicated 01/2012   affecting right eye with blured vision   Hypertension    Nicotine  addiction    Obesity    Sinusitis     Past Surgical History:  Procedure Laterality Date   CHOLECYSTECTOMY  1995   laparoscopic   COLONOSCOPY WITH PROPOFOL  N/A 04/08/2016   Procedure: COLONOSCOPY WITH PROPOFOL ;  Surgeon: Gustav LULLA Mcgee, MD;  Location: [facility];  Service: Endoscopy;  Laterality: N/A;   COLONOSCOPY WITH PROPOFOL  N/A 08/29/2022   Procedure: COLONOSCOPY WITH PROPOFOL ;  Surgeon: Mcgee Gustav LULLA, MD;  Location: [facility];  Service: Gastroenterology;  Laterality: N/A;   MARSUPRALIZTION OF LEFT BARTHOLIN'S CYST  2004   POLYPECTOMY  08/29/2022   Procedure: POLYPECTOMY;  Surgeon: Mcgee Gustav LULLA, MD;  Location: [facility];  Service: Gastroenterology;;   TONSILLECTOMY     child   TUBAL LIGATION  1994    The following portions of the patient's history were reviewed and updated as appropriate: allergies, current medications, past family history, past medical history, past social history, past surgical history and problem list.   Health Maintenance:   Normal pap and negative HRHPV:   Diagnosis   Date Value Ref Range Status  06/28/2016   Final   NEGATIVE FOR INTRAEPITHELIAL LESIONS OR MALIGNANCY.     Normal mammogram on 03/29/2024, cat a, birad 2.  Through [facility].   Review of Systems:  Pertinent items noted in HPI and remainder of comprehensive ROS otherwise negative.  Physical Exam:  There were no vitals taken for this visit. CONSTITUTIONAL: Well-developed, well-nourished female in no acute distress.  HEENT:  Normocephalic, atraumatic. External right and left ear normal. No scleral icterus.  NECK: Normal range of motion, supple, no masses noted on observation SKIN: No rash noted. Not diaphoretic. No erythema. No pallor. MUSCULOSKELETAL: Normal range of motion. No edema noted. NEUROLOGIC: Alert and oriented to person, place, and time. Normal muscle tone coordination. No cranial nerve deficit noted. PSYCHIATRIC: Normal mood and affect. Normal behavior. Normal judgment and thought content.  PELVIC: Deferred  Labs and Imaging No results found for this or any previous visit (from the past week). CT Renal Stone Study Result Date: 05/06/2024 CLINICAL DATA:  Left lower quadrant abdominal pain radiating up to the chest for the past 2 weeks, gradually worsening. EXAM: CT ABDOMEN AND PELVIS WITHOUT CONTRAST TECHNIQUE: Multidetector CT imaging of the abdomen and pelvis was performed following the standard protocol without IV contrast. RADIATION DOSE REDUCTION: This exam was performed according to the departmental dose-optimization program which includes automated exposure control, adjustment of the mA and/or kV according to patient size and/or use of iterative  reconstruction technique. COMPARISON:  Abdomen and pelvis CT dated 01/12/2005. Chest CT dated 08/21/2017. Pelvic ultrasound dated 12/28/2004. FINDINGS: Lower chest: Enlarged central pulmonary arteries with a main pulmonary artery diameter of 3.6 cm. Normal-sized heart. Minimal bibasilar atelectasis. Small left lower lobe calcified  granuloma. Previously demonstrated bilateral small noncalcified lung nodules appear smaller and less dense, compatible with a benign process not requiring imaging follow-up. Hepatobiliary: No focal liver abnormality is seen. Status post cholecystectomy. No biliary dilatation. Pancreas: Unremarkable. No pancreatic ductal dilatation or surrounding inflammatory changes. Spleen: Normal in size without focal abnormality. Adrenals/Urinary Tract: Adrenal glands are unremarkable. Kidneys are normal, without renal calculi, focal lesion, or hydronephrosis. Bladder is unremarkable. Stomach/Bowel: Multiple sigmoid colon diverticula without evidence of diverticulitis. Small amount of high density material in the distal appendix with no evidence of appendicitis. Unremarkable stomach and small bowel. Vascular/Lymphatic: No significant vascular findings are present. No enlarged abdominal or pelvic lymph nodes. Reproductive: The previously demonstrated multi-cystic and minimally calcified right ovarian mass has more solid-appearing components than cystic components today with significant progression of scattered coarse calcifications without significant change in size. This continues to abut the uterus and measures 5.1 x 3.8 cm on image number 82/2, 5.0 x 3.4 x 3.2 cm on the 12/28/2004 pelvic ultrasound. There is an exophytic posterior uterine fundal fibroid on the left measuring 2.2 cm on coronal image number 120/6. No left adnexal mass. Other: No abdominal wall hernia or abnormality. No abdominopelvic ascites. Musculoskeletal: Lumbar and lower thoracic spine degenerative changes. Mild left and minimal right hip degenerative changes. IMPRESSION: 1. No acute abnormality. 2. Sigmoid diverticulosis without evidence of diverticulitis. 3. 5.1 x 3.8 cm right ovarian mass with more solid-appearing components than cystic components today with significant progression of scattered coarse calcifications and without significant change in size  since 2006. This is compatible with a benign process, not needing imaging follow-up. 4. 2.2 cm exophytic posterior uterine fundal fibroid. 5. Enlarged central pulmonary arteries, compatible with pulmonary arterial hypertension. Electronically Signed   By: Elspeth Bathe M.D.   On: 05/06/2024 10:14   DG Chest 2 View Result Date: 05/06/2024 CLINICAL DATA:  Left-sided chest pain. EXAM: CHEST - 2 VIEW COMPARISON:  05/29/2017 FINDINGS: Low lung volumes. The lungs are clear without focal pneumonia, edema, pneumothorax or pleural effusion. Cardiopericardial silhouette is at upper limits of normal for size. No acute bony abnormality. Telemetry leads overlie the chest. IMPRESSION: Low volume film without acute cardiopulmonary findings. Electronically Signed   By: Camellia Candle M.D.   On: 05/06/2024 08:50    Assessment and Plan:  1. Right ovarian cyst (Primary) Stable cyst vs fibroid - never clearly delineated which it was based on her US  which was also on in 2006. Regardless, there has been no interval change from 2006 to present day with the exception of increased calcifications. Although unlikely ovarian in origin, we can also check a ROMA as a precaution.     Diagnoses and all orders for this visit:  Right ovarian cyst     No orders of the defined types were placed in this encounter.    Routine preventative health maintenance measures emphasized. Please refer to After Visit Summary for other counseling recommendations.   No follow-ups on file.  Vina Solian, MD, FACOG Obstetrician & Gynecologist, [facility] for [facility], [facility]

## 2024-05-29 ENCOUNTER — Encounter: Payer: Self-pay | Admitting: Obstetrics and Gynecology

## 2024-05-29 ENCOUNTER — Ambulatory Visit: Admitting: Obstetrics and Gynecology

## 2024-05-29 VITALS — BP 95/66 | HR 73 | Wt 310.0 lb

## 2024-05-29 DIAGNOSIS — N83201 Unspecified ovarian cyst, right side: Secondary | ICD-10-CM

## 2024-08-01 ENCOUNTER — Telehealth: Payer: Self-pay

## 2024-08-01 NOTE — Telephone Encounter (Signed)
 Left voicemail for patient to call the office back and let us  know if she is able to come in tomorrow at 10:35am instead of 9:35am.

## 2024-08-02 ENCOUNTER — Ambulatory Visit: Admitting: Obstetrics and Gynecology

## 2024-09-04 ENCOUNTER — Ambulatory Visit: Admitting: Obstetrics and Gynecology
